# Patient Record
Sex: Female | Born: 1945 | Race: White | Hispanic: No | Smoking: Never smoker
Health system: Southern US, Community
[De-identification: ages and names within clinical notes are randomized; demographics above are authoritative.]

## PROBLEM LIST (undated history)

## (undated) DIAGNOSIS — N393 Stress incontinence (female) (male): Secondary | ICD-10-CM

## (undated) DIAGNOSIS — F32A Depression, unspecified: Secondary | ICD-10-CM

## (undated) DIAGNOSIS — I420 Dilated cardiomyopathy: Principal | ICD-10-CM

## (undated) DIAGNOSIS — M1990 Unspecified osteoarthritis, unspecified site: Secondary | ICD-10-CM

## (undated) DIAGNOSIS — M7121 Synovial cyst of popliteal space [Baker], right knee: Secondary | ICD-10-CM

## (undated) DIAGNOSIS — I2510 Atherosclerotic heart disease of native coronary artery without angina pectoris: Secondary | ICD-10-CM

## (undated) DIAGNOSIS — Z860100 Personal history of colon polyps, unspecified: Secondary | ICD-10-CM

## (undated) DIAGNOSIS — F329 Major depressive disorder, single episode, unspecified: Secondary | ICD-10-CM

## (undated) DIAGNOSIS — R011 Cardiac murmur, unspecified: Secondary | ICD-10-CM

## (undated) DIAGNOSIS — K649 Unspecified hemorrhoids: Secondary | ICD-10-CM

## (undated) DIAGNOSIS — M8580 Other specified disorders of bone density and structure, unspecified site: Secondary | ICD-10-CM

## (undated) DIAGNOSIS — E213 Hyperparathyroidism, unspecified: Secondary | ICD-10-CM

## (undated) DIAGNOSIS — Z9289 Personal history of other medical treatment: Secondary | ICD-10-CM

## (undated) DIAGNOSIS — K219 Gastro-esophageal reflux disease without esophagitis: Secondary | ICD-10-CM

## (undated) DIAGNOSIS — D6851 Activated protein C resistance: Secondary | ICD-10-CM

## (undated) DIAGNOSIS — E669 Obesity, unspecified: Secondary | ICD-10-CM

## (undated) DIAGNOSIS — I82512 Chronic embolism and thrombosis of left femoral vein: Secondary | ICD-10-CM

## (undated) DIAGNOSIS — Z8719 Personal history of other diseases of the digestive system: Secondary | ICD-10-CM

## (undated) DIAGNOSIS — K5790 Diverticulosis of intestine, part unspecified, without perforation or abscess without bleeding: Secondary | ICD-10-CM

## (undated) DIAGNOSIS — E785 Hyperlipidemia, unspecified: Secondary | ICD-10-CM

## (undated) DIAGNOSIS — R609 Edema, unspecified: Secondary | ICD-10-CM

## (undated) DIAGNOSIS — G4730 Sleep apnea, unspecified: Secondary | ICD-10-CM

## (undated) DIAGNOSIS — R002 Palpitations: Secondary | ICD-10-CM

## (undated) HISTORY — PX: HIP ARTHROPLASTY: SHX981

## (undated) HISTORY — PX: COLONOSCOPY: SHX174

## (undated) HISTORY — DX: Atherosclerotic heart disease of native coronary artery without angina pectoris: I25.10

## (undated) HISTORY — DX: Activated protein C resistance: D68.51

## (undated) HISTORY — DX: Chronic embolism and thrombosis of left femoral vein: I82.512

## (undated) HISTORY — DX: Palpitations: R00.2

## (undated) HISTORY — DX: Hyperlipidemia, unspecified: E78.5

## (undated) HISTORY — DX: Hyperparathyroidism, unspecified: E21.3

## (undated) HISTORY — DX: Dilated cardiomyopathy: I42.0

## (undated) HISTORY — DX: Personal history of other medical treatment: Z92.89

## (undated) HISTORY — PX: OTHER SURGICAL HISTORY: SHX169

## (undated) HISTORY — PX: TONSILLECTOMY: SUR1361

---

## 1996-07-15 HISTORY — PX: BLADDER SUSPENSION: SHX72

## 1996-07-15 HISTORY — PX: BILATERAL OOPHORECTOMY: SHX1221

## 1996-07-15 HISTORY — PX: VAGINAL HYSTERECTOMY: SUR661

## 2015-08-18 ENCOUNTER — Encounter (HOSPITAL_COMMUNITY): Payer: Self-pay | Admitting: Family Medicine

## 2015-08-18 ENCOUNTER — Other Ambulatory Visit (HOSPITAL_COMMUNITY): Payer: Self-pay | Admitting: Physician Assistant

## 2015-08-18 ENCOUNTER — Emergency Department (HOSPITAL_COMMUNITY)
Admission: EM | Admit: 2015-08-18 | Discharge: 2015-08-18 | Disposition: A | Discharge status: Home / Self Care | Payer: Medicare Other | Attending: Emergency Medicine | Admitting: Emergency Medicine

## 2015-08-18 ENCOUNTER — Ambulatory Visit (INDEPENDENT_AMBULATORY_CARE_PROVIDER_SITE_OTHER)
Admission: RE | Admit: 2015-08-18 | Discharge: 2015-08-18 | Disposition: A | Discharge status: Home / Self Care | Payer: Medicare Other | Source: Ambulatory Visit | Attending: Vascular Surgery | Admitting: Vascular Surgery

## 2015-08-18 DIAGNOSIS — I82491 Acute embolism and thrombosis of other specified deep vein of right lower extremity: Secondary | ICD-10-CM | POA: Insufficient documentation

## 2015-08-18 DIAGNOSIS — I82431 Acute embolism and thrombosis of right popliteal vein: Secondary | ICD-10-CM | POA: Insufficient documentation

## 2015-08-18 DIAGNOSIS — I82432 Acute embolism and thrombosis of left popliteal vein: Secondary | ICD-10-CM

## 2015-08-18 DIAGNOSIS — I82492 Acute embolism and thrombosis of other specified deep vein of left lower extremity: Secondary | ICD-10-CM | POA: Insufficient documentation

## 2015-08-18 DIAGNOSIS — E669 Obesity, unspecified: Secondary | ICD-10-CM | POA: Diagnosis not present

## 2015-08-18 DIAGNOSIS — Z880 Allergy status to penicillin: Secondary | ICD-10-CM | POA: Insufficient documentation

## 2015-08-18 DIAGNOSIS — I82403 Acute embolism and thrombosis of unspecified deep veins of lower extremity, bilateral: Secondary | ICD-10-CM | POA: Diagnosis not present

## 2015-08-18 DIAGNOSIS — M79605 Pain in left leg: Secondary | ICD-10-CM

## 2015-08-18 DIAGNOSIS — M7989 Other specified soft tissue disorders: Principal | ICD-10-CM

## 2015-08-18 DIAGNOSIS — I82412 Acute embolism and thrombosis of left femoral vein: Secondary | ICD-10-CM

## 2015-08-18 DIAGNOSIS — M79602 Pain in left arm: Secondary | ICD-10-CM | POA: Diagnosis not present

## 2015-08-18 LAB — CBC WITH DIFFERENTIAL/PLATELET
BASOS PCT: 0 %
Basophils Absolute: 0 10*3/uL (ref 0.0–0.1)
EOS ABS: 0.3 10*3/uL (ref 0.0–0.7)
Eosinophils Relative: 5 %
HEMATOCRIT: 38.5 % (ref 36.0–46.0)
HEMOGLOBIN: 12.9 g/dL (ref 12.0–15.0)
LYMPHS ABS: 1.4 10*3/uL (ref 0.7–4.0)
Lymphocytes Relative: 21 %
MCH: 28.7 pg (ref 26.0–34.0)
MCHC: 33.5 g/dL (ref 30.0–36.0)
MCV: 85.7 fL (ref 78.0–100.0)
Monocytes Absolute: 0.3 10*3/uL (ref 0.1–1.0)
Monocytes Relative: 4 %
NEUTROS ABS: 4.7 10*3/uL (ref 1.7–7.7)
NEUTROS PCT: 70 %
Platelets: 196 10*3/uL (ref 150–400)
RBC: 4.49 MIL/uL (ref 3.87–5.11)
RDW: 13.3 % (ref 11.5–15.5)
WBC: 6.7 10*3/uL (ref 4.0–10.5)

## 2015-08-18 LAB — COMPREHENSIVE METABOLIC PANEL
ALBUMIN: 3.9 g/dL (ref 3.5–5.0)
ALK PHOS: 63 U/L (ref 38–126)
ALT: 14 U/L (ref 14–54)
AST: 19 U/L (ref 15–41)
Anion gap: 10 (ref 5–15)
BILIRUBIN TOTAL: 0.7 mg/dL (ref 0.3–1.2)
BUN: 11 mg/dL (ref 6–20)
CALCIUM: 10.9 mg/dL — AB (ref 8.9–10.3)
CO2: 27 mmol/L (ref 22–32)
CREATININE: 0.79 mg/dL (ref 0.44–1.00)
Chloride: 105 mmol/L (ref 101–111)
GFR calc Af Amer: >60 mL/min (ref >60)
GFR calc non Af Amer: >60 mL/min (ref >60)
GLUCOSE: 93 mg/dL (ref 65–99)
Potassium: 4.1 mmol/L (ref 3.5–5.1)
SODIUM: 142 mmol/L (ref 135–145)
Total Protein: 7.6 g/dL (ref 6.5–8.1)

## 2015-08-18 LAB — PROTIME-INR
INR: 1.05 (ref 0.00–1.49)
Prothrombin Time: 13.9 seconds (ref 11.6–15.2)

## 2015-08-18 MED ORDER — XARELTO VTE STARTER PACK 15 & 20 MG PO TBPK: 15.0000 mg | ORAL_TABLET | ORAL | Status: DC

## 2015-08-18 NOTE — Discharge Instructions (Signed)
Follow-up closely with your doctor to help determine why you're getting blood clots. Return the ER if he notice bleeding, head injury or other concerns such as shortness of breath, chest pain or passing out.  If you were given medicines take as directed.  If you are on coumadin or contraceptives realize their levels and effectiveness is altered by many different medicines.  If you have any reaction (rash, tongues swelling, other) to the medicines stop taking and see a physician.    If your blood pressure was elevated in the ER make sure you follow up for management with a primary doctor or return for chest pain, shortness of breath or stroke symptoms.  Please follow up as directed and return to the ER or see a physician for new or worsening symptoms.  Thank you. Filed Vitals:   08/18/15 1502  BP: 142/70  Pulse: 77  Temp: 97.9 F (36.6 C)  TempSrc: Oral  Resp: 16  SpO2: 99%

## 2015-08-18 NOTE — ED Notes (Addendum)
Pt here for positive DVT in BLE. Sent from [facility]

## 2015-08-18 NOTE — ED Provider Notes (Signed)
CSN: [id number]     Arrival date & time 08/18/15  1433 History   First MD Initiated Contact with Patient 08/18/15 1615     Chief Complaint  Patient presents with  . DVT     (Consider location/radiation/quality/duration/timing/severity/associated sxs/prior Treatment) HPI Comments: 70 year old female with history of obesity presents with bilateral leg swelling and bilateral DVT diagnosed [facility]. Patient denies classic risk factors for blood clots. Patient is had worsening swelling in both legs. No shortness of breath or chest pain, no syncope. No recent surgeries. Patient feels well otherwise. Patient has no kidney disease not on blood thinners.  The history is provided by the patient.    History reviewed. No pertinent past medical history. History reviewed. No pertinent past surgical history. History reviewed. No pertinent family history. Social History  Substance Use Topics  . Smoking status: Never Smoker   . Smokeless tobacco: None  . Alcohol Use: None   OB History    No data available     Review of Systems  Constitutional: Negative for fever and chills.  HENT: Negative for congestion.   Eyes: Negative for visual disturbance.  Respiratory: Negative for shortness of breath.   Cardiovascular: Positive for leg swelling. Negative for chest pain.  Gastrointestinal: Negative for vomiting and abdominal pain.  Genitourinary: Negative for dysuria and flank pain.  Musculoskeletal: Negative for back pain, neck pain and neck stiffness.  Skin: Negative for rash.  Neurological: Negative for light-headedness and headaches.      Allergies  Penicillins  Home Medications   Prior to Admission medications   Medication Sig Start Date End Date Taking? Authorizing Provider  XARELTO STARTER PACK 15 & 20 MG TBPK Take 15-20 mg by mouth as directed. Take as directed on package: Start with one 15mg  tablet by mouth twice a day with food. On Day 22, switch  to one 20mg  tablet once a day with food. 08/18/15   Elnora Morrison, MD   BP 142/70 mmHg  Pulse 77  Temp(Src) 97.9 F (36.6 C) (Oral)  Resp 16  SpO2 99% Physical Exam  Constitutional: She is oriented to person, place, and time. She appears well-developed and well-nourished.  HENT:  Head: Normocephalic and atraumatic.  Eyes: Conjunctivae are normal. Right eye exhibits no discharge. Left eye exhibits no discharge.  Neck: Normal range of motion. Neck supple. No tracheal deviation present.  Cardiovascular: Normal rate and regular rhythm.   Pulmonary/Chest: Effort normal and breath sounds normal.  Abdominal: Soft. She exhibits no distension. There is no tenderness. There is no guarding.  Musculoskeletal: She exhibits edema (mild to moderate bilateral mild calf tenderness bilateral).  Neurological: She is alert and oriented to person, place, and time.  Skin: Skin is warm. No rash noted.  Psychiatric: She has a normal mood and affect.  Nursing note and vitals reviewed.   ED Course  Procedures (including critical care time) Labs Review Labs Reviewed  COMPREHENSIVE METABOLIC PANEL - Abnormal; Notable for the following:    Calcium 10.9 (*)    All other components within normal limits  CBC WITH DIFFERENTIAL/PLATELET  PROTIME-INR    Imaging Review No results found. I have personally reviewed and evaluated these images and lab results as part of my medical decision-making.   EKG Interpretation None      MDM   Final diagnoses:  DVT (deep venous thrombosis), bilateral   Patient presents with bilateral leg edema and newly diagnosed blood clots. No reason for the blood clots  at this time. Stressed close follow up with a primary doctor for further workup such as cancer. Plan for Xarelto starter pack, discussed risks of bleeding and reasons to return for signs of PE.  Results and differential diagnosis were discussed with the patient/parent/guardian. Xrays were independently reviewed by  myself.  Close follow up outpatient was discussed, comfortable with the plan.   Medications - No data to display  Filed Vitals:   08/18/15 1502 08/18/15 1645  BP: 142/70 140/72  Pulse: 77 80  Temp: 97.9 F (36.6 C) 98.4 F (36.9 C)  TempSrc: Oral Oral  Resp: 16 16  SpO2: 99% 100%    Final diagnoses:  DVT (deep venous thrombosis), bilateral        Elnora Morrison, MD 08/18/15 1645

## 2015-09-11 ENCOUNTER — Telehealth: Payer: Self-pay | Admitting: Hematology & Oncology

## 2015-09-11 NOTE — Telephone Encounter (Signed)
Patient called and cx 09/28/15 apt and resch for 10/09/15

## 2015-09-28 ENCOUNTER — Ambulatory Visit: Payer: Medicare Other | Admitting: Hematology & Oncology

## 2015-09-28 ENCOUNTER — Other Ambulatory Visit: Payer: Medicare Other

## 2015-09-28 ENCOUNTER — Ambulatory Visit: Payer: Medicare Other

## 2015-10-09 ENCOUNTER — Other Ambulatory Visit: Payer: Self-pay | Admitting: Family

## 2015-10-09 ENCOUNTER — Ambulatory Visit: Payer: Medicare Other

## 2015-10-09 ENCOUNTER — Other Ambulatory Visit (HOSPITAL_BASED_OUTPATIENT_CLINIC_OR_DEPARTMENT_OTHER): Payer: Medicare Other

## 2015-10-09 ENCOUNTER — Ambulatory Visit (HOSPITAL_BASED_OUTPATIENT_CLINIC_OR_DEPARTMENT_OTHER): Payer: Medicare Other | Admitting: Hematology & Oncology

## 2015-10-09 VITALS — BP 126/62 | HR 70 | Temp 98.3°F | Resp 16 | Ht 66.0 in | Wt 193.0 lb

## 2015-10-09 DIAGNOSIS — I82403 Acute embolism and thrombosis of unspecified deep veins of lower extremity, bilateral: Secondary | ICD-10-CM

## 2015-10-09 DIAGNOSIS — I82413 Acute embolism and thrombosis of femoral vein, bilateral: Secondary | ICD-10-CM

## 2015-10-09 DIAGNOSIS — Z7901 Long term (current) use of anticoagulants: Secondary | ICD-10-CM | POA: Diagnosis not present

## 2015-10-09 LAB — CBC WITH DIFFERENTIAL (CANCER CENTER ONLY)
BASO#: 0 10*3/uL (ref 0.0–0.2)
BASO%: 0.5 % (ref 0.0–2.0)
EOS%: 2.1 % (ref 0.0–7.0)
Eosinophils Absolute: 0.1 10*3/uL (ref 0.0–0.5)
HEMATOCRIT: 35.6 % (ref 34.8–46.6)
HGB: 12 g/dL (ref 11.6–15.9)
LYMPH#: 0.9 10*3/uL (ref 0.9–3.3)
LYMPH%: 19.8 % (ref 14.0–48.0)
MCH: 29.3 pg (ref 26.0–34.0)
MCHC: 33.7 g/dL (ref 32.0–36.0)
MCV: 87 fL (ref 81–101)
MONO#: 0.3 10*3/uL (ref 0.1–0.9)
MONO%: 6 % (ref 0.0–13.0)
NEUT#: 3.1 10*3/uL (ref 1.5–6.5)
NEUT%: 71.6 % (ref 39.6–80.0)
Platelets: 149 10*3/uL (ref 145–400)
RBC: 4.1 10*6/uL (ref 3.70–5.32)
RDW: 14.1 % (ref 11.1–15.7)
WBC: 4.3 10*3/uL (ref 3.9–10.0)

## 2015-10-09 LAB — COMPREHENSIVE METABOLIC PANEL
ALT: 12 U/L (ref 0–55)
AST: 17 U/L (ref 5–34)
Albumin: 3.6 g/dL (ref 3.5–5.0)
Alkaline Phosphatase: 52 U/L (ref 40–150)
Anion Gap: 7 mEq/L (ref 3–11)
BUN: 15.7 mg/dL (ref 7.0–26.0)
CALCIUM: 9.8 mg/dL (ref 8.4–10.4)
CHLORIDE: 109 meq/L (ref 98–109)
CO2: 24 mEq/L (ref 22–29)
Creatinine: 0.7 mg/dL (ref 0.6–1.1)
EGFR: 86 mL/min/{1.73_m2} — ABNORMAL LOW (ref >90)
Glucose: 94 mg/dl (ref 70–140)
POTASSIUM: 3.9 meq/L (ref 3.5–5.1)
SODIUM: 140 meq/L (ref 136–145)
Total Bilirubin: 0.76 mg/dL (ref 0.20–1.20)
Total Protein: 7 g/dL (ref 6.4–8.3)

## 2015-10-09 NOTE — Progress Notes (Signed)
Referral MD  Reason for Referral: Bilateral lower extremity thromboembolic disease   Chief Complaint  Patient presents with  . OTHER    New Patient  : I have blood clots in my legs.  HPI: Gloria Lambert is a very nice 70 year old white female. She has been good health. She recently moved down from [place]. She moved out in November area and a son lives down here.  She has a history of hyperparathyroidism. She has a history of hyperlipidemia. If she is having some problems with candidiasis on the skin. I don't know if she might have some diabetes.  She developed some swelling in the left leg back in early February. She went to urgent care. She ultimately had a Doppler done. This showed a thrombus extending from the common femoral vein down to the popliteal vein in the left leg. She also had a thrombus in the right leg. This did not appear to be as extensive.  She was started on Xarelto. She has compression stockings on.  His heart assay if there is any obvious family history of blood clots. I am not sure if her son had any kind of blood clot.  She has had 2 pregnancies. She has had no miscarriages. Both pregnancies were at term.  She has had surgery in the past. She's had no problems with blood clot after surgery.  She has not been on oral contraceptive. She said that she had IUDs back when she was premenopausal.  She's had no bleeding. She's had no cough. She does not smoke. She rarely has alcoholic beverages.  She was kindly referred to the [facility] for an evaluation.  She is in good spirits. She is getting ready to drive up to [place] for Easter. She drives. Her husband does not go because he has COPD and apparently cannot travel much.  Her appetite is good. She is not a vegetarian  Overall, her performance status is ECOG 1.                         No past medical history on file.:  No past surgical history on  file.:   Current outpatient prescriptions:  .  ranitidine (ZANTAC) 300 MG tablet, , Disp: , Rfl:  .  terbinafine (LAMISIL) 250 MG tablet, TK 1 T PO QD, Disp: , Rfl: 0 .  Vitamin D, Ergocalciferol, (DRISDOL) 50000 units CAPS capsule, TK 1 C PO 1 TIME A WK FOR 8 WKS, Disp: , Rfl: 0 .  XARELTO STARTER PACK 15 & 20 MG TBPK, Take 15-20 mg by mouth as directed. Take as directed on package: Start with one 15mg  tablet by mouth twice a day with food. On Day 22, switch to one 20mg  tablet once a day with food., Disp: 51 each, Rfl: 0:  :  Allergies  Allergen Reactions  . Penicillins   :  No family history on file.:  Social History   Social History  . Marital Status: Married    Spouse Name: N/A  . Number of Children: N/A  . Years of Education: N/A   Occupational History  . Not on file.   Social History Main Topics  . Smoking status: Never Smoker   . Smokeless tobacco: Not on file  . Alcohol Use: Not on file  . Drug Use: Not on file  . Sexual Activity: Not on file   Other Topics Concern  . Not on file   Social History  Narrative  . No narrative on file  :  Pertinent items are noted in HPI.  Exam: @IPVITALS @ Well-developed and well-nourished white female in no obvious distress. Vital signs show a temperature of 98.3. Pulse 70. Blood pressure 126/62. Weight is 193 pounds. Head and neck exam shows no ocular or oral lesions. There are no palpable cervical or supraclavicular lymph nodes. Lungs are clear bilaterally. Cardiac exam regular rate and rhythm with no murmurs rubs or bruits. Abdomen is soft. She is mildly obese. She has good bowel sounds. There is no fluid wave. There is no palpable hepatosplenomegaly. Back exam shows no tenderness over the spine, ribs or hips. Extremities she has compression stockings on both legs. After the stockings were removed, she had good pulses in her distal extremities. She had no palpable venous cord in the legs. She may have had some superficial  thrombophlebitis on the dorsum of the left foot. Neurological exam is nonfocal.    Recent Labs  10/09/15 1028  WBC 4.3  HGB 12.0  HCT 35.6  PLT 149   No results for input(s): NA, K, CL, CO2, GLUCOSE, BUN, CREATININE, CALCIUM in the last 72 hours.  Blood smear review:  None  Pathology: None     Assessment and Plan:  Gloria Lambert is a very nice 70 year old white female. She has thromboembolic disease in her legs. It is hard to tell if there is a family history. We will certainly do a hypercoagulable panel on her. I think this is definitely warranted.  She will need at least 6 months of anticoagulation with full dose Xarelto. We then might be able to get her on half dose Xarelto afterwards as maintenance.  I told her that on her trip to [place], that she should take baby aspirin starting the day before her trip, continuing through her trip and then stop the day afterwards. I told her to make sure she takes the baby aspirin with food. I also told her to make sure she stops every hour or so.  We will set her up with [facility] will we see her back in one month. This will give [place] an idea as to how well things are working.  I spent about 45 minutes with her. I answered all of her questions.

## 2015-10-11 LAB — CARDIOLIPIN ANTIBODIES, IGG, IGM, IGA: Anticardiolipin Ab,IgM,Qn: <9 MPL U/mL (ref 0–12)

## 2015-10-11 LAB — LUPUS ANTICOAGULANT PANEL
DRVVT CONFIRM: 1.6 ratio — AB (ref 0.8–1.2)
DRVVT: 115.8 s — AB (ref 0.0–44.0)
HEXAGONAL PHASE PHOSPHOLIPID: 5 s (ref 0–11)
PTT-LA INCUB MIX: 50.5 s — AB (ref 0.0–40.6)
PTT-LA Mix: 40.4 s (ref 0.0–40.6)
PTT-LA: 44.1 s — AB (ref 0.0–43.6)
dRVVT Mix: 81.2 s — ABNORMAL HIGH (ref 0.0–44.0)

## 2015-10-11 LAB — PROTEIN S, TOTAL: Protein S, Total: 105 % (ref 60–150)

## 2015-10-11 LAB — BETA-2-GLYCOPROTEIN I ABS, IGG/M/A
Beta-2 Glyco 1 IgA: <9 GPI IgA units (ref 0–25)
Beta-2 Glycoprotein I Ab, IgG: <9 GPI IgG units (ref 0–20)

## 2015-10-11 LAB — PROTEIN S ACTIVITY: PROTEIN S ACTIVITY: 168 % — AB (ref 63–140)

## 2015-10-11 LAB — PROTEIN C ACTIVITY

## 2015-10-11 LAB — PROTEIN C, TOTAL: Protein C Antigen: 134 % (ref 60–150)

## 2015-10-11 LAB — ANTITHROMBIN III: Antithrombin Activity: 180 % — ABNORMAL HIGH (ref 75–135)

## 2015-10-12 LAB — FACTOR 5 LEIDEN

## 2015-10-13 LAB — PROTHROMBIN GENE MUTATION

## 2015-11-06 ENCOUNTER — Ambulatory Visit (HOSPITAL_BASED_OUTPATIENT_CLINIC_OR_DEPARTMENT_OTHER)
Admission: RE | Admit: 2015-11-06 | Discharge: 2015-11-06 | Disposition: A | Discharge status: Home / Self Care | Payer: Medicare Other | Source: Ambulatory Visit | Attending: Hematology & Oncology | Admitting: Hematology & Oncology

## 2015-11-06 ENCOUNTER — Encounter: Payer: Self-pay | Admitting: Hematology & Oncology

## 2015-11-06 ENCOUNTER — Ambulatory Visit (HOSPITAL_BASED_OUTPATIENT_CLINIC_OR_DEPARTMENT_OTHER): Payer: Medicare Other | Admitting: Hematology & Oncology

## 2015-11-06 ENCOUNTER — Other Ambulatory Visit (HOSPITAL_BASED_OUTPATIENT_CLINIC_OR_DEPARTMENT_OTHER): Payer: Medicare Other

## 2015-11-06 VITALS — BP 114/48 | HR 68 | Temp 98.0°F | Resp 18 | Ht 66.0 in | Wt 192.0 lb

## 2015-11-06 DIAGNOSIS — I82402 Acute embolism and thrombosis of unspecified deep veins of left lower extremity: Secondary | ICD-10-CM | POA: Diagnosis present

## 2015-11-06 DIAGNOSIS — I82403 Acute embolism and thrombosis of unspecified deep veins of lower extremity, bilateral: Secondary | ICD-10-CM | POA: Insufficient documentation

## 2015-11-06 DIAGNOSIS — D6851 Activated protein C resistance: Secondary | ICD-10-CM | POA: Diagnosis not present

## 2015-11-06 DIAGNOSIS — Z7901 Long term (current) use of anticoagulants: Secondary | ICD-10-CM | POA: Diagnosis not present

## 2015-11-06 DIAGNOSIS — M7121 Synovial cyst of popliteal space [Baker], right knee: Secondary | ICD-10-CM | POA: Diagnosis not present

## 2015-11-06 DIAGNOSIS — I82512 Chronic embolism and thrombosis of left femoral vein: Secondary | ICD-10-CM

## 2015-11-06 HISTORY — DX: Activated protein C resistance: D68.51

## 2015-11-06 HISTORY — DX: Chronic embolism and thrombosis of left femoral vein: I82.512

## 2015-11-06 LAB — COMPREHENSIVE METABOLIC PANEL
ALT: 12 U/L (ref 0–55)
ANION GAP: 7 meq/L (ref 3–11)
AST: 17 U/L (ref 5–34)
Albumin: 3.8 g/dL (ref 3.5–5.0)
Alkaline Phosphatase: 56 U/L (ref 40–150)
BILIRUBIN TOTAL: 0.66 mg/dL (ref 0.20–1.20)
BUN: 17.3 mg/dL (ref 7.0–26.0)
CHLORIDE: 111 meq/L — AB (ref 98–109)
CO2: 25 mEq/L (ref 22–29)
CREATININE: 0.8 mg/dL (ref 0.6–1.1)
Calcium: 10.3 mg/dL (ref 8.4–10.4)
EGFR: 82 mL/min/{1.73_m2} — ABNORMAL LOW (ref >90)
Glucose: 86 mg/dl (ref 70–140)
Potassium: 4.2 mEq/L (ref 3.5–5.1)
SODIUM: 143 meq/L (ref 136–145)
TOTAL PROTEIN: 7.3 g/dL (ref 6.4–8.3)

## 2015-11-06 LAB — CBC WITH DIFFERENTIAL (CANCER CENTER ONLY)
BASO#: 0 10*3/uL (ref 0.0–0.2)
BASO%: 0.3 % (ref 0.0–2.0)
EOS%: 3 % (ref 0.0–7.0)
Eosinophils Absolute: 0.2 10*3/uL (ref 0.0–0.5)
HCT: 37.7 % (ref 34.8–46.6)
HGB: 12.9 g/dL (ref 11.6–15.9)
LYMPH#: 1.2 10*3/uL (ref 0.9–3.3)
LYMPH%: 20.9 % (ref 14.0–48.0)
MCH: 29.7 pg (ref 26.0–34.0)
MCHC: 34.2 g/dL (ref 32.0–36.0)
MCV: 87 fL (ref 81–101)
MONO#: 0.3 10*3/uL (ref 0.1–0.9)
MONO%: 4.7 % (ref 0.0–13.0)
NEUT#: 4.1 10*3/uL (ref 1.5–6.5)
NEUT%: 71.1 % (ref 39.6–80.0)
PLATELETS: 152 10*3/uL (ref 145–400)
RBC: 4.34 10*6/uL (ref 3.70–5.32)
RDW: 14.1 % (ref 11.1–15.7)
WBC: 5.7 10*3/uL (ref 3.9–10.0)

## 2015-11-06 NOTE — Progress Notes (Signed)
Hematology and Oncology Follow Up Visit  Gloria Lambert [id number] Jan 22, 1946 70 y.o. 11/06/2015   Principle Diagnosis:   Thrombus of the left femoral vein  Heterozygous for factor V Leiden mutation  Current Therapy:    Xarelto 20 mg by mouth daily-finish 6 months in August 2017     Interim History:  Gloria Lambert is back for follow-up. 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 We found this out when we tested her we saw her a month ago.  She had another Doppler done of her left leg today. This showed partial recanalization. Unfortunately, there is no previous Doppler to look at for the radiologist.  She is doing well. She went to [place] for Easter. She had a good time up there.  She has had no bleeding. Her left leg does not bother her.  She's had no cough. She has had no shortness of breath.  She is worried about her her family as whether or not they need to be tested. I told her that if she has any sisters, they probably need to be tested, particularly if they are on postmenopausal estrogens. As for her sons, they probably need to be checked. Particularly, if they smoke, are diabetic, have sedentary jobs.  Overall, her performance status is ECOG 0.  Medications:  Current outpatient prescriptions:  .  ranitidine (ZANTAC) 300 MG tablet, , Disp: , Rfl:  .  Vitamin D, Ergocalciferol, (DRISDOL) 50000 units CAPS capsule, TK 1 C PO 1 TIME A WK FOR 8 WKS, Disp: , Rfl: 0 .  XARELTO STARTER PACK 15 & 20 MG TBPK, Take 15-20 mg by mouth as directed. Take as directed on package: Start with one 15mg  tablet by mouth twice a day with food. On Day 22, switch to one 20mg  tablet once a day with food., Disp: 51 each, Rfl: 0  Allergies:  Allergies  Allergen Reactions  . Penicillins     Past Medical History, Surgical history, Social history, and Family History were reviewed and updated.  Review of Systems: As above  Physical Exam:  height is 5\' 6"  (1.676 m) and weight is 192 lb  (87.091 kg). Her oral temperature is 98 F (36.7 C). Her blood pressure is 114/48 and her pulse is 68. Her respiration is 18.   Wt Readings from Last 3 Encounters:  11/06/15 192 lb (87.091 kg)  10/09/15 193 lb (87.544 kg)     Head and neck exam shows no ocular or oral lesions. There are no palpable cervical or supraclavicular lymph nodes. Lungs are clear bilaterally. Cardiac exam regular rate and rhythm with no murmurs rubs or bruits. Abdomen is soft. She is mildly obese. She has good bowel sounds. There is no fluid wave. There is no palpable hepatosplenomegaly. Back exam shows no tenderness over the spine, ribs or hips. Extremities she has compression stockings on both legs. After the stockings were removed, she had good pulses in her distal extremities. She had no palpable venous cord in the legs. She may have had some superficial thrombophlebitis on the dorsum of the left foot. Neurological exam is nonfocal.   Lab Results  Component Value Date   WBC 5.7 11/06/2015   HGB 12.9 11/06/2015   HCT 37.7 11/06/2015   MCV 87 11/06/2015   PLT 152 11/06/2015     Chemistry      Component Value Date/Time   NA 140 10/09/2015 1028   NA 142 08/18/2015 1508   K 3.9 10/09/2015 1028   K 4.1  08/18/2015 1508   CL 105 08/18/2015 1508   CO2 24 10/09/2015 1028   CO2 27 08/18/2015 1508   BUN 15.7 10/09/2015 1028   BUN 11 08/18/2015 1508   CREATININE 0.7 10/09/2015 1028   CREATININE 0.79 08/18/2015 1508      Component Value Date/Time   CALCIUM 9.8 10/09/2015 1028   CALCIUM 10.9* 08/18/2015 1508   ALKPHOS 52 10/09/2015 1028   ALKPHOS 63 08/18/2015 1508   AST 17 10/09/2015 1028   AST 19 08/18/2015 1508   ALT 12 10/09/2015 1028   ALT 14 08/18/2015 1508   BILITOT 0.76 10/09/2015 1028   BILITOT 0.7 08/18/2015 1508         Impression and Plan: Gloria Lambert is a 70 year old white female. She is heterozygous for the factor V Leiden. She has a DVT in the left leg. 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 She is wearing her compression stockings. Her leg looks quite good.  I think that given the new information regarding "maintenance" Xarelto, I initially be a very good idea for her after 6 months of full dose Xarelto.  She will complete full dose Xarelto in August. We see her back, I would like to get another Doppler so we can see how her thrombus has improved.  I spent about 35 minutes with her. I went over the factor V Leiden situation.  She's been very proactive with try to minimize her risk of clot propagation.   Volanda Napoleon, MD 4/24/201711:41 AM

## 2015-11-07 LAB — D-DIMER, QUANTITATIVE (NOT AT ARMC): D-DIMER: 0.42 mg{FEU}/L (ref 0.00–0.49)

## 2015-11-09 ENCOUNTER — Ambulatory Visit: Payer: Medicare Other | Admitting: Hematology & Oncology

## 2015-11-09 ENCOUNTER — Other Ambulatory Visit (HOSPITAL_BASED_OUTPATIENT_CLINIC_OR_DEPARTMENT_OTHER): Payer: Medicare Other

## 2015-11-09 ENCOUNTER — Other Ambulatory Visit: Payer: Medicare Other

## 2015-12-19 ENCOUNTER — Encounter: Payer: Self-pay | Admitting: *Deleted

## 2015-12-21 ENCOUNTER — Other Ambulatory Visit: Payer: Self-pay | Admitting: *Deleted

## 2015-12-21 DIAGNOSIS — I82512 Chronic embolism and thrombosis of left femoral vein: Secondary | ICD-10-CM

## 2015-12-21 MED ORDER — RIVAROXABAN 20 MG PO TABS: 20.0000 mg | ORAL_TABLET | Freq: Every day | ORAL | Status: DC

## 2016-03-01 ENCOUNTER — Encounter (HOSPITAL_COMMUNITY): Payer: Self-pay

## 2016-03-01 ENCOUNTER — Ambulatory Visit (HOSPITAL_COMMUNITY)
Admission: RE | Admit: 2016-03-01 | Discharge: 2016-03-01 | Disposition: A | Discharge status: Home / Self Care | Payer: Medicare Other | Source: Ambulatory Visit | Attending: Internal Medicine | Admitting: Internal Medicine

## 2016-03-01 DIAGNOSIS — K219 Gastro-esophageal reflux disease without esophagitis: Secondary | ICD-10-CM | POA: Insufficient documentation

## 2016-03-01 DIAGNOSIS — M810 Age-related osteoporosis without current pathological fracture: Secondary | ICD-10-CM | POA: Diagnosis not present

## 2016-03-01 DIAGNOSIS — Z86718 Personal history of other venous thrombosis and embolism: Secondary | ICD-10-CM | POA: Diagnosis not present

## 2016-03-01 MED ORDER — ZOLEDRONIC ACID 5 MG/100ML IV SOLN
5.0000 mg | Freq: Once | INTRAVENOUS | Status: AC
  Administered 2016-03-01: 5 mg via INTRAVENOUS
  Filled 2016-03-01: qty 100

## 2016-03-01 MED ORDER — SODIUM CHLORIDE 0.9 % IV SOLN
Freq: Once | INTRAVENOUS | Status: AC
  Administered 2016-03-01: 15:00:00 via INTRAVENOUS

## 2016-03-01 NOTE — Progress Notes (Signed)
Uneventful 1st infusion of RECLAST. 15 minute observation post infusion and discharged ambulatory unaccompanied to elevator

## 2016-03-01 NOTE — Discharge Instructions (Signed)
RECLAST °Zoledronic Acid injection (Paget's Disease, Osteoporosis) °What is this medicine? °ZOLEDRONIC ACID (ZOE le dron ik AS id) lowers the amount of calcium loss from bone. It is used to treat Paget's disease and osteoporosis in women. °This medicine may be used for other purposes; ask your health care provider or pharmacist if you have questions. °What should I tell my health care provider before I take this medicine? °They need to know if you have any of these conditions: °-aspirin-sensitive asthma °-cancer, especially if you are receiving medicines used to treat cancer °-dental disease or wear dentures °-infection °-kidney disease °-low levels of calcium in the blood °-past surgery on the parathyroid gland or intestines °-receiving corticosteroids like dexamethasone or prednisone °-an unusual or allergic reaction to zoledronic acid, other medicines, foods, dyes, or preservatives °-pregnant or trying to get pregnant °-breast-feeding °How should I use this medicine? °This medicine is for infusion into a vein. It is given by a health care professional in a hospital or clinic setting. °Talk to your pediatrician regarding the use of this medicine in children. This medicine is not approved for use in children. °Overdosage: If you think you have taken too much of this medicine contact a poison control center or emergency room at once. °NOTE: This medicine is only for you. Do not share this medicine with others. °What if I miss a dose? °It is important not to miss your dose. Call your doctor or health care professional if you are unable to keep an appointment. °What may interact with this medicine? °-certain antibiotics given by injection °-NSAIDs, medicines for pain and inflammation, like ibuprofen or naproxen °-some diuretics like bumetanide, furosemide °-teriparatide °This list may not describe all possible interactions. Give your health care provider a list of all the medicines, herbs, non-prescription drugs, or  dietary supplements you use. Also tell them if you smoke, drink alcohol, or use illegal drugs. Some items may interact with your medicine. °What should I watch for while using this medicine? °Visit your doctor or health care professional for regular checkups. It may be some time before you see the benefit from this medicine. Do not stop taking your medicine unless your doctor tells you to. Your doctor may order blood tests or other tests to see how you are doing. °Women should inform their doctor if they wish to become pregnant or think they might be pregnant. There is a potential for serious side effects to an unborn child. Talk to your health care professional or pharmacist for more information. °You should make sure that you get enough calcium and vitamin D while you are taking this medicine. Discuss the foods you eat and the vitamins you take with your health care professional. °Some people who take this medicine have severe bone, joint, and/or muscle pain. This medicine may also increase your risk for jaw problems or a broken thigh bone. Tell your doctor right away if you have severe pain in your jaw, bones, joints, or muscles. Tell your doctor if you have any pain that does not go away or that gets worse. °Tell your dentist and dental surgeon that you are taking this medicine. You should not have major dental surgery while on this medicine. See your dentist to have a dental exam and fix any dental problems before starting this medicine. Take good care of your teeth while on this medicine. Make sure you see your dentist for regular follow-up appointments. °What side effects may I notice from receiving this medicine? °Side effects that you   should report to your doctor or health care professional as soon as possible: °-allergic reactions like skin rash, itching or hives, swelling of the face, lips, or tongue °-anxiety, confusion, or depression °-breathing problems °-changes in vision °-eye pain °-feeling faint or  lightheaded, falls °-jaw pain, especially after dental work °-mouth sores °-muscle cramps, stiffness, or weakness °-redness, blistering, peeling or loosening of the skin, including inside the mouth °-trouble passing urine or change in the amount of urine °Side effects that usually do not require medical attention (report to your doctor or health care professional if they continue or are bothersome): °-bone, joint, or muscle pain °-constipation °-diarrhea °-fever °-hair loss °-irritation at site where injected °-loss of appetite °-nausea, vomiting °-stomach upset °-trouble sleeping °-trouble swallowing °-weak or tired °This list may not describe all possible side effects. Call your doctor for medical advice about side effects. You may report side effects to FDA at 1-800-FDA-1088. °Where should I keep my medicine? °This drug is given in a hospital or clinic and will not be stored at home. °NOTE: This sheet is a summary. It may not cover all possible information. If you have questions about this medicine, talk to your doctor, pharmacist, or health care provider. °  °© 2016, [employer]vier/[employer]. (2013-11-27 14:19:57) °Osteoporosis °Osteoporosis is the thinning and loss of density in the bones. Osteoporosis makes the bones more brittle, fragile, and likely to break (fracture). Over time, osteoporosis can cause the bones to become so weak that they fracture after a simple fall. The bones most likely to fracture are the bones in the hip, wrist, and spine. °CAUSES  °The exact cause is not known. °RISK FACTORS °Anyone can develop osteoporosis. You may be at greater risk if you have a family history of the condition or have poor nutrition. You may also have a higher risk if you are:  °· Female.   °· 50 years old or older. °· A smoker. °· Not physically active.   °· White or Asian. °· Slender. °SIGNS AND SYMPTOMS  °A fracture might be the first sign of the disease, especially if it results from a fall or injury that would  not usually cause a bone to break. Other signs and symptoms include:  °· Low back and neck pain. °· Stooped posture. °· Height loss. °DIAGNOSIS  °To make a diagnosis, your health care provider may: °· Take a medical history. °· Perform a physical exam. °· Order tests, such as: °¨ A bone mineral density test. °¨ A dual-energy X-ray absorptiometry test. °TREATMENT  °The goal of osteoporosis treatment is to strengthen your bones to reduce your risk of a fracture. Treatment may involve: °· Making lifestyle changes, such as: °¨ Eating a diet rich in calcium. °¨ Doing weight-bearing and muscle-strengthening exercises. °¨ Stopping tobacco use. °¨ Limiting alcohol intake. °· Taking medicine to slow the process of bone loss or to increase bone density. °· Monitoring your levels of calcium and vitamin D. °HOME CARE INSTRUCTIONS °· Include calcium and vitamin D in your diet. Calcium is important for bone health, and vitamin D helps the body absorb calcium. °· Perform weight-bearing and muscle-strengthening exercises as directed by your health care provider. °· Do not use any tobacco products, including cigarettes, chewing tobacco, and electronic cigarettes. If you need help quitting, ask your health care provider. °· Limit your alcohol intake. °· Take medicines only as directed by your health care provider. °· Keep all follow-up visits as directed by your health care provider. This is important. °· Take   precautions at home to lower your risk of falling, such as: °¨ Keeping rooms well lit and clutter free. °¨ Installing safety rails on stairs. °¨ Using rubber mats in the bathroom and other areas that are often wet or slippery. °SEEK IMMEDIATE MEDICAL CARE IF:  °You fall or injure yourself.  °  °This information is not intended to replace advice given to you by your health care provider. Make sure you discuss any questions you have with your health care provider. °  °Document Released: 04/10/2005 Document Revised: 07/22/2014  Document Reviewed: 12/09/2013 °Elsevier Interactive Patient Education ©2016 [employer] ° ° °

## 2016-03-06 ENCOUNTER — Encounter: Payer: Self-pay | Admitting: Hematology & Oncology

## 2016-03-06 ENCOUNTER — Ambulatory Visit (HOSPITAL_BASED_OUTPATIENT_CLINIC_OR_DEPARTMENT_OTHER): Payer: Medicare Other | Admitting: Hematology & Oncology

## 2016-03-06 ENCOUNTER — Other Ambulatory Visit: Payer: Self-pay | Admitting: Nurse Practitioner

## 2016-03-06 ENCOUNTER — Other Ambulatory Visit (HOSPITAL_BASED_OUTPATIENT_CLINIC_OR_DEPARTMENT_OTHER): Payer: Medicare Other

## 2016-03-06 ENCOUNTER — Ambulatory Visit (HOSPITAL_BASED_OUTPATIENT_CLINIC_OR_DEPARTMENT_OTHER)
Admission: RE | Admit: 2016-03-06 | Discharge: 2016-03-06 | Disposition: A | Discharge status: Home / Self Care | Payer: Medicare Other | Source: Ambulatory Visit | Attending: Hematology & Oncology | Admitting: Hematology & Oncology

## 2016-03-06 ENCOUNTER — Encounter (HOSPITAL_BASED_OUTPATIENT_CLINIC_OR_DEPARTMENT_OTHER): Payer: Self-pay

## 2016-03-06 DIAGNOSIS — I82512 Chronic embolism and thrombosis of left femoral vein: Secondary | ICD-10-CM

## 2016-03-06 DIAGNOSIS — M7121 Synovial cyst of popliteal space [Baker], right knee: Secondary | ICD-10-CM | POA: Insufficient documentation

## 2016-03-06 DIAGNOSIS — Z86718 Personal history of other venous thrombosis and embolism: Secondary | ICD-10-CM | POA: Insufficient documentation

## 2016-03-06 DIAGNOSIS — D6851 Activated protein C resistance: Secondary | ICD-10-CM

## 2016-03-06 DIAGNOSIS — I82402 Acute embolism and thrombosis of unspecified deep veins of left lower extremity: Secondary | ICD-10-CM

## 2016-03-06 DIAGNOSIS — Z7901 Long term (current) use of anticoagulants: Secondary | ICD-10-CM

## 2016-03-06 LAB — COMPREHENSIVE METABOLIC PANEL
ALBUMIN: 3.5 g/dL (ref 3.5–5.0)
ALK PHOS: 57 U/L (ref 40–150)
ALT: 14 U/L (ref 0–55)
AST: 18 U/L (ref 5–34)
Anion Gap: 6 mEq/L (ref 3–11)
BUN: 14 mg/dL (ref 7.0–26.0)
CALCIUM: 9.5 mg/dL (ref 8.4–10.4)
CO2: 24 mEq/L (ref 22–29)
CREATININE: 0.6 mg/dL (ref 0.6–1.1)
Chloride: 112 mEq/L — ABNORMAL HIGH (ref 98–109)
EGFR: >90 mL/min/{1.73_m2} (ref >90)
GLUCOSE: 100 mg/dL (ref 70–140)
Potassium: 4.7 mEq/L (ref 3.5–5.1)
SODIUM: 142 meq/L (ref 136–145)
TOTAL PROTEIN: 6.9 g/dL (ref 6.4–8.3)
Total Bilirubin: 0.91 mg/dL (ref 0.20–1.20)

## 2016-03-06 LAB — CBC WITH DIFFERENTIAL (CANCER CENTER ONLY)
BASO#: 0 10*3/uL (ref 0.0–0.2)
BASO%: 0.6 % (ref 0.0–2.0)
EOS%: 2.3 % (ref 0.0–7.0)
Eosinophils Absolute: 0.1 10*3/uL (ref 0.0–0.5)
HEMATOCRIT: 35 % (ref 34.8–46.6)
HEMOGLOBIN: 12.1 g/dL (ref 11.6–15.9)
LYMPH#: 0.8 10*3/uL — AB (ref 0.9–3.3)
LYMPH%: 23.1 % (ref 14.0–48.0)
MCH: 30.6 pg (ref 26.0–34.0)
MCHC: 34.6 g/dL (ref 32.0–36.0)
MCV: 89 fL (ref 81–101)
MONO#: 0.3 10*3/uL (ref 0.1–0.9)
MONO%: 7.1 % (ref 0.0–13.0)
NEUT%: 66.9 % (ref 39.6–80.0)
NEUTROS ABS: 2.3 10*3/uL (ref 1.5–6.5)
Platelets: 146 10*3/uL (ref 145–400)
RBC: 3.95 10*6/uL (ref 3.70–5.32)
RDW: 13.6 % (ref 11.1–15.7)
WBC: 3.5 10*3/uL — ABNORMAL LOW (ref 3.9–10.0)

## 2016-03-06 MED ORDER — RIVAROXABAN 10 MG PO TABS: 10.0000 mg | ORAL_TABLET | Freq: Every day | ORAL | 12 refills | Status: DC

## 2016-03-06 NOTE — Progress Notes (Signed)
Hematology and Oncology Follow Up Visit  Gloria Lambert [id number] 03-28-1946 70 y.o. 03/06/2016   Principle Diagnosis:   Thrombus of the left femoral vein  Heterozygous for factor V Leiden mutation  Current Therapy:    Xarelto 20 mg by mouth daily-finish 6 months in August 2017  Maintenance Xarelto 10 mg by mouth daily-1 year to finish up in September 2018     Interim History:  Gloria Lambert is back for follow-up. 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   We last saw her back in April. Since then, she's been busy. She's been traveling. She has been down  in [place]. She was up in [place].  We did go ahead and do another Doppler of her leg today. This shows the chronic nonobstructing thrombus in the mid left common femoral vein. There is no change from last study.  She does wear compression stockings. She really has not noted much swelling in the left leg.  She's had no problems with fatigue or weakness. She's had no chest wall pain. She's had no bleeding or bruising. She's had no fever.  Overall, her performance status is ECOG 0.  Medications:  Current Outpatient Prescriptions:  .  calcium carbonate (TUMS - DOSED IN MG ELEMENTAL CALCIUM) 500 MG chewable tablet, Chew 1 tablet by mouth 3 times/day as needed-between meals & bedtime for indigestion or heartburn., Disp: , Rfl:  .  cholecalciferol (VITAMIN D) 1000 units tablet, Take 1,000 Units by mouth daily., Disp: , Rfl:  .  Omega-3 Fatty Acids (FISH OIL) 1200 MG CAPS, Take 1 capsule by mouth daily., Disp: , Rfl:  .  Probiotic Product (PROBIOTIC-10 PO), Take 1 capsule by mouth daily., Disp: , Rfl:  .  ranitidine (ZANTAC) 300 MG tablet, Take 300 mg by mouth daily. , Disp: , Rfl:  .  rivaroxaban (XARELTO) 20 MG TABS tablet, Take 1 tablet (20 mg total) by mouth daily with supper., Disp: 30 tablet, Rfl: 3  Allergies:  Allergies  Allergen Reactions  . Penicillins   . Latex Rash    rash  . Penicillin G Rash    Past  Medical History, Surgical history, Social history, and Family History were reviewed and updated.  Review of Systems: As above  Physical Exam:  height is 5\' 6"  (1.676 m) and weight is 185 lb (83.9 kg). Her oral temperature is 98.4 F (36.9 C). Her blood pressure is 122/59 (abnormal) and her pulse is 64. Her respiration is 16.   Wt Readings from Last 3 Encounters:  03/06/16 185 lb (83.9 kg)  03/01/16 187 lb 3.2 oz (84.9 kg)  11/06/15 192 lb (87.1 kg)     Head and neck exam shows no ocular or oral lesions. There are no palpable cervical or supraclavicular lymph nodes. Lungs are clear bilaterally. Cardiac exam regular rate and rhythm with no murmurs rubs or bruits. Abdomen is soft. She is mildly obese. She has good bowel sounds. There is no fluid wave. There is no palpable hepatosplenomegaly. Back exam shows no tenderness over the spine, ribs or hips. Extremities she has compression stockings on both legs. After the stockings were removed, she had good pulses in her distal extremities. She had no palpable venous cord in the legs. She may have had some superficial thrombophlebitis on the dorsum of the left foot. Neurological exam is nonfocal.   Lab Results  Component Value Date   WBC 3.5 (L) 03/06/2016   HGB 12.1 03/06/2016   HCT 35.0 03/06/2016   MCV  89 03/06/2016   PLT 146 03/06/2016     Chemistry      Component Value Date/Time   NA 142 03/06/2016 1030   K 4.7 03/06/2016 1030   CL 105 08/18/2015 1508   CO2 24 03/06/2016 1030   BUN 14.0 03/06/2016 1030   CREATININE 0.6 03/06/2016 1030      Component Value Date/Time   CALCIUM 9.5 03/06/2016 1030   ALKPHOS 57 03/06/2016 1030   AST 18 03/06/2016 1030   ALT 14 03/06/2016 1030   BILITOT 0.91 03/06/2016 1030         Impression and Plan: Gloria Lambert is a 70 year old white female. She is heterozygous for the factor V Leiden. She has a DVT in the left leg. I'm sure she will have a chronic thrombus. She is wearing her compression  stockings. Her leg looks quite good.  I think that it would be a good idea to have her on maintenance Xarelto. I think this would be a very good way of time to minimize her risk of clot formation. She does travel quite a bit. As such, I think that having her on blood thinner would not be about idea for another year.   I talked to her about this. She is agreeable.  I would like to see her back in another 4 months. I don't think we need another Doppler. She has a chronic thrombus which I'm sure she will always have.   Volanda Napoleon, MD 8/23/20175:50 PM

## 2016-03-07 ENCOUNTER — Other Ambulatory Visit (HOSPITAL_BASED_OUTPATIENT_CLINIC_OR_DEPARTMENT_OTHER): Payer: Medicare Other

## 2016-03-07 ENCOUNTER — Ambulatory Visit: Payer: Medicare Other | Admitting: Hematology & Oncology

## 2016-03-07 ENCOUNTER — Other Ambulatory Visit: Payer: Medicare Other

## 2016-03-07 LAB — D-DIMER, QUANTITATIVE (NOT AT ARMC): D-DIMER: 0.4 mg{FEU}/L (ref 0.00–0.49)

## 2016-04-19 ENCOUNTER — Ambulatory Visit: Payer: Medicare Other | Attending: Internal Medicine | Admitting: Physical Therapy

## 2016-04-19 DIAGNOSIS — M6281 Muscle weakness (generalized): Secondary | ICD-10-CM | POA: Insufficient documentation

## 2016-04-19 DIAGNOSIS — M25511 Pain in right shoulder: Secondary | ICD-10-CM | POA: Insufficient documentation

## 2016-04-19 DIAGNOSIS — R293 Abnormal posture: Secondary | ICD-10-CM | POA: Insufficient documentation

## 2016-04-19 NOTE — Therapy (Signed)
[facility] [address]  [place], [place], [address] Phone: [phone]   Fax:  [phone]  Physical Therapy Evaluation  Patient Details  Name: Gloria Lambert MRN: [id number] Date of Birth: 25-Nov-1945 Referring Provider: Dr. Delrae Rend  Encounter Date: 04/19/2016      PT End of Session - 04/19/16 1138    Visit Number 1   Number of Visits 12   Date for PT Re-Evaluation 05/31/16   PT Start Time 1055   PT Stop Time 1135   PT Time Calculation (min) 40 min   Activity Tolerance Patient tolerated treatment well   Behavior During Therapy [facility] for tasks assessed/performed      Past Medical History:  Diagnosis Date  . 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 Heterozygous factor V Leiden mutation ([place]) 11/06/2015  . Parathyroid abnormality ([place])    "have 2 nodules on them and they are watching these. Makes my calcium level elevated"    Past Surgical History:  Procedure Laterality Date  . arthroscopic knee surgery Right   . BILATERAL OOPHORECTOMY  1998  . BLADDER SUSPENSION  1998  . HIP ARTHROPLASTY Right    total hip replacement  . VAGINAL HYSTERECTOMY  1998    There were no vitals filed for this visit.       Subjective Assessment - 04/19/16 1058    Subjective Pt is a 70 y/o female who presents to OPPT for sudden onset of R shoulder pain ~ 5 weeks ago.  Pt reports she had reclast infusion in Aug 2017 and concerned about side effects (but MD ruled this out).     Pertinent History Chronic DVT LLE   Limitations House hold activities;Lifting   Diagnostic tests none   Patient Stated Goals improve pain   Currently in Pain? Yes   Pain Score 0-No pain  up to 5/10   Pain Location Shoulder   Pain Orientation Right   Pain Descriptors / Indicators Sore   Pain Type --  subacute   Pain Onset More than a month ago   Pain Frequency Intermittent   Aggravating Factors  lying on  back, vacuuming, overhead reaching and abduction   Pain Relieving Factors keeping shoulder still   Effect of Pain on Daily Activities dressing, sleeping            [facility] PT Assessment - 04/19/16 1103      Assessment   Medical Diagnosis R shoulder pain; thoracic kyphosis   Referring Provider Dr. Delrae Rend   Onset Date/Surgical Date 03/15/16   Hand Dominance Right   Next MD Visit unknown   Prior Therapy following [facility] - no other PT     Precautions   Precautions None     Restrictions   Weight Bearing Restrictions No     Balance Screen   Has the patient fallen in the past 6 months No   Has the patient had a decrease in activity level because of a fear of falling?  No   Is the patient reluctant to leave their home because of a fear of falling?  No     Home Social worker Private residence   Living Arrangements Spouse/significant other   Additional Comments independent with ADLs, some pain with dressing/bathing     Prior Function   Level of Independence Independent   Vocation Retired   [employer] retired Haematologist, scrapbooking  Cognition   Overall Cognitive Status Within Functional Limits for tasks assessed     Observation/Other Assessments   Focus on Therapeutic Outcomes (FOTO)  56 (44% limited; predicted 31% limited)     Posture/Postural Control   Posture/Postural Control Postural limitations   Postural Limitations Rounded Shoulders;Forward head;Increased thoracic kyphosis     AROM   AROM Assessment Site Shoulder   Right/Left Shoulder Right;Left   Right Shoulder Extension --   Right Shoulder Flexion 154 Degrees   Right Shoulder ABduction 115 Degrees   Right Shoulder Internal Rotation 90 Degrees   Right Shoulder External Rotation 85 Degrees   Left Shoulder Flexion 160 Degrees   Left Shoulder ABduction 160 Degrees   Left Shoulder Internal Rotation 90 Degrees   Left Shoulder External Rotation 90 Degrees      PROM   PROM Assessment Site Shoulder   Right/Left Shoulder Right   Right Shoulder ABduction 132 Degrees     Strength   Strength Assessment Site Shoulder   Right/Left Shoulder Right;Left   Right Shoulder Flexion 3/5   Right Shoulder ABduction 3/5   Right Shoulder Internal Rotation 5/5   Right Shoulder External Rotation 3/5   Left Shoulder Flexion 4/5   Left Shoulder ABduction 4/5   Left Shoulder Internal Rotation 5/5   Left Shoulder External Rotation 4/5   Right/Left Elbow --     Palpation   Palpation comment no tenderness noted today with palpation     Special Tests    Special Tests Rotator Cuff Impingement   Rotator Cuff Impingment tests Michel Bickers test     Hawkins-Kennedy test   Findings Positive   Side Right                   [facility] Adult PT Treatment/Exercise - 04/19/16 1103      Exercises   Exercises Shoulder     Shoulder Exercises: Standing   Row Both;10 reps;Theraband   Theraband Level (Shoulder Row) Level 3 (Green)   Row Limitations for HEP instruction; cues for scap retraction     Shoulder Exercises: IT sales professional 1 rep;20 seconds   Corner Stretch Limitations for HEP instruction; mid level arm height                PT Education - 04/19/16 1138    Education provided Yes   Education Details clinical findings, POC, HEP   Person(s) Educated Patient   Methods Explanation;Demonstration;Handout   Comprehension Verbalized understanding;Returned demonstration;Need further instruction             PT Long Term Goals - 04/19/16 1141      PT LONG TERM GOAL #1   Title independent with HEP (05/31/16)   Time 6   Period Weeks   Status New     PT LONG TERM GOAL #2   Title improve R shoulder abduction AROM to 150 degrees for improved function and motion (05/31/16)   Time 6   Period Weeks   Status New     PT LONG TERM GOAL #3   Title demonstrate at least 4/5 RUE shoulder strength for improved function (05/31/16)    Time 6   Period Weeks   Status New     PT LONG TERM GOAL #4   Title report ability to perform ADLs without increase in pain for improved function with ADLs (05/31/16)   Time 6   Period Weeks   Status New  Plan - 04/19/16 1138    Clinical Impression Statement Pt is a 70 y/o female who presents to OPPT for moderate complexity eval of R shoulder pain consistent with rotator cuff impingement.  Pt demonstrates R shoulder pain, weakness, postural abnormalities and decreased ROM affecting ADLs and function.  Will benefit from PT to maximize function and address deficits.  Pt with chronic DVT currently asymptomatic but will monitor with exercise.   Rehab Potential Good   PT Frequency 2x / week   PT Duration 6 weeks   PT Treatment/Interventions ADLs/Self Care Home Management;Cryotherapy;Electrical Stimulation;Iontophoresis 4mg /ml Dexamethasone;Moist Heat;Ultrasound;Neuromuscular re-education;Therapeutic exercise;Therapeutic activities;Functional mobility training;Patient/family education;Manual techniques;Passive range of motion;Vasopneumatic Device;Dry needling;Taping   PT Next Visit Plan review HEP, progress RTC/flexion strengthening, abduction ROM   Consulted and Agree with Plan of Care Patient      Patient will benefit from skilled therapeutic intervention in order to improve the following deficits and impairments:  Pain, Impaired UE functional use, Postural dysfunction, Decreased range of motion, Decreased strength  Visit Diagnosis: Acute pain of right shoulder - Plan: PT plan of care cert/re-cert  Abnormal posture - Plan: PT plan of care cert/re-cert  Muscle weakness (generalized) - Plan: PT plan of care cert/re-cert      G-Codes - [id number] 1143    Functional Assessment Tool Used FOTO 44% limited   Functional Limitation Carrying, moving and handling objects   Carrying, Moving and Handling Objects Current Status [id number]) At least 40 percent but less than 60 percent  impaired, limited or restricted   Carrying, Moving and Handling Objects Goal Status [id number]) At least 20 percent but less than 40 percent impaired, limited or restricted       Problem List Patient Active Problem List   Diagnosis Date Noted  . 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 Heterozygous factor V Leiden mutation ([place]) 11/06/2015       Laureen Abrahams, PT, DPT 04/19/16 11:45 AM    [facility] [address]  [place], [place], [address] Phone: [phone]   Fax:  [phone]  Name: Gloria Lambert MRN: [id number] Date of Birth: July 31, 1945

## 2016-04-19 NOTE — Patient Instructions (Signed)
CHEST: Doorway, Bilateral - Standing    Standing in doorway, place hands on wall with elbows bent at shoulder height. Lean forward. Hold _20-30__ seconds. _2-3__ reps per set, __1-2_ sets per day, _7__ days per week  Copyright  VHI. All rights reserved.    Scapular Retraction: Rowing (Eccentric) - Arms - Side (Resistance Band)    Hold end of band in each hand. Pull back until elbows are even with trunk. Keep elbows by sides, thumbs up. Slowly release for 3-5 seconds. Use __green___ resistance band. _15__ reps per set, __1-2_ sets per day, _7__ days per week.   http://ecce.exer.us/227   Copyright  VHI. All rights reserved.

## 2016-04-23 ENCOUNTER — Ambulatory Visit: Payer: Medicare Other

## 2016-04-23 DIAGNOSIS — M25511 Pain in right shoulder: Secondary | ICD-10-CM | POA: Diagnosis not present

## 2016-04-23 DIAGNOSIS — M6281 Muscle weakness (generalized): Secondary | ICD-10-CM

## 2016-04-23 DIAGNOSIS — R293 Abnormal posture: Secondary | ICD-10-CM

## 2016-04-23 NOTE — Therapy (Signed)
[facility] [address]  [place], [place], [address] Phone: [phone]   Fax:  [phone]  Physical Therapy Treatment  Patient Details  Name: Gloria Lambert MRN: [id number] Date of Birth: 09-Jul-1946 Referring Provider: Dr. Delrae Rend  Encounter Date: 04/23/2016      PT End of Session - 04/23/16 1454    Visit Number 2   Number of Visits 12   Date for PT Re-Evaluation 05/31/16   PT Start Time 1402   PT Stop Time 1445   PT Time Calculation (min) 43 min   Activity Tolerance Patient tolerated treatment well   Behavior During Therapy [facility] for tasks assessed/performed      Past Medical History:  Diagnosis Date  . 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 Heterozygous factor V Leiden mutation ([place]) 11/06/2015  . Parathyroid abnormality ([place])    "have 2 nodules on them and they are watching these. Makes my calcium level elevated"    Past Surgical History:  Procedure Laterality Date  . arthroscopic knee surgery Right   . BILATERAL OOPHORECTOMY  1998  . BLADDER SUSPENSION  1998  . HIP ARTHROPLASTY Right    total hip replacement  . VAGINAL HYSTERECTOMY  1998    There were no vitals filed for this visit.      Subjective Assessment - 04/23/16 1407    Subjective Pt. reporting she still can't sleep on R side due to shoulder pain.     Patient Stated Goals improve pain   Currently in Pain? No/denies   Pain Score 0-No pain   Multiple Pain Sites No        Today's treatment:   Therex: UBE: lvl 1.5, 3 min each way forward/backward  HEP review:  Scapular retraction with green TB x 10 reps; pt. With limited retraction movement Doorway stretch (mid/low) x 30 sec each; pt. with mild pain on mid thus instructed to only perform low   Therex: Leaning next to door frame:        Scapular squeeze 5" x 10 reps Standing R shoulder IR, ER with red TB x 10 reps  Standing B shoulder  ER with foam bolster squeeze on doorframe x 10 reps  Pulleys R shoulder abduction x 2 min  R shoulder abduction stretch on peg board 10" x 10 reps BATCA low row 20# x 15 reps   * Supine on 1/2 foam bolster attemped however not tolerated well           PT Long Term Goals - 04/23/16 1454      PT LONG TERM GOAL #1   Title independent with HEP (05/31/16)   Time 6   Period Weeks   Status On-going     PT LONG TERM GOAL #2   Title improve R shoulder abduction AROM to 150 degrees for improved function and motion (05/31/16)   Time 6   Period Weeks   Status On-going     PT LONG TERM GOAL #3   Title demonstrate at least 4/5 RUE shoulder strength for improved function (05/31/16)   Time 6   Period Weeks   Status On-going     PT LONG TERM GOAL #4   Title report ability to perform ADLs without increase in pain for improved function with ADLs (05/31/16)   Time 6   Period Weeks   Status On-going  Plan - 04/23/16 1454    Clinical Impression Statement Pt. pain free today and with good demonstration of HEP activities.  Mid doorway stretch causing mild pain at home thus pt. instructed to only perform low doorway stretch.  Scapular and RTC strengthening progressed today with good response.  Will plan to continue progression as tolerated.   PT Treatment/Interventions ADLs/Self Care Home Management;Cryotherapy;Electrical Stimulation;Iontophoresis 4mg /ml Dexamethasone;Moist Heat;Ultrasound;Neuromuscular re-education;Therapeutic exercise;Therapeutic activities;Functional mobility training;Patient/family education;Manual techniques;Passive range of motion;Vasopneumatic Device;Dry needling;Taping   PT Next Visit Plan Progress RTC/flexion strengthening, abduction ROM      Patient will benefit from skilled therapeutic intervention in order to improve the following deficits and impairments:  Pain, Impaired UE functional use, Postural dysfunction, Decreased range of motion,  Decreased strength  Visit Diagnosis: Acute pain of right shoulder  Abnormal posture  Muscle weakness (generalized)     Problem List Patient Active Problem List   Diagnosis Date Noted  . 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 Heterozygous factor V Leiden mutation [facility]) 11/06/2015    Bess Harvest, PTA 04/23/2016, 6:26 PM  [facility] [address]  [place], [place], [address] Phone: [phone]   Fax:  [phone]  Name: Gloria Lambert MRN: [id number] Date of Birth: [birth year]

## 2016-04-25 ENCOUNTER — Ambulatory Visit: Payer: Medicare Other | Admitting: Physical Therapy

## 2016-04-25 DIAGNOSIS — R293 Abnormal posture: Secondary | ICD-10-CM

## 2016-04-25 DIAGNOSIS — M6281 Muscle weakness (generalized): Secondary | ICD-10-CM

## 2016-04-25 DIAGNOSIS — M25511 Pain in right shoulder: Secondary | ICD-10-CM

## 2016-04-25 NOTE — Therapy (Signed)
[facility] [address]  [place], [place], [address] Phone: [phone]   Fax:  [phone]  Physical Therapy Treatment  Patient Details  Name: Gloria Lambert MRN: [id number] Date of Birth: 1946/03/26 Referring Provider: Dr. Delrae Rend  Encounter Date: 04/25/2016      PT End of Session - 04/25/16 1439    Visit Number 3   Number of Visits 12   Date for PT Re-Evaluation 05/31/16   PT Start Time 1400   PT Stop Time 1442   PT Time Calculation (min) 42 min   Activity Tolerance Patient tolerated treatment well   Behavior During Therapy [facility] for tasks assessed/performed      Past Medical History:  Diagnosis Date  . 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 Heterozygous factor V Leiden mutation ([place]) 11/06/2015  . Parathyroid abnormality ([place])    "have 2 nodules on them and they are watching these. Makes my calcium level elevated"    Past Surgical History:  Procedure Laterality Date  . arthroscopic knee surgery Right   . BILATERAL OOPHORECTOMY  1998  . BLADDER SUSPENSION  1998  . HIP ARTHROPLASTY Right    total hip replacement  . VAGINAL HYSTERECTOMY  1998    There were no vitals filed for this visit.      Subjective Assessment - 04/25/16 1403    Subjective R shoulder is "about the same."   Pertinent History Chronic DVT LLE   Limitations House hold activities;Lifting   Diagnostic tests none   Patient Stated Goals improve pain   Currently in Pain? No/denies                         [facility] Adult PT Treatment/Exercise - 04/25/16 1404      Shoulder Exercises: Seated   Abduction Right;20 reps;Weights   ABduction Weight (lbs) 1   ABduction Limitations eccentric 5 sec holds with 1# 2x10     Shoulder Exercises: Standing   Row Both;15 reps;Theraband   Theraband Level (Shoulder Row) Level 3 (Green)   Row Limitations 2 sets   Other Standing Exercises  scaption wall ladder with 1# cuff weight x 10     Shoulder Exercises: Pulleys   ABduction 3 minutes   ABduction Limitations scaption     Shoulder Exercises: ROM/Strengthening   UBE (Upper Arm Bike) L3.0 x 6 min (3' fwd/3' bwd)   Cybex Row Limitations 20# 2x15 narrow grip     Manual Therapy   Manual Therapy Passive ROM   Passive ROM abduction/scaption in sitting to tolerance                     PT Long Term Goals - 04/23/16 1454      PT LONG TERM GOAL #1   Title independent with HEP (05/31/16)   Time 6   Period Weeks   Status On-going     PT LONG TERM GOAL #2   Title improve R shoulder abduction AROM to 150 degrees for improved function and motion (05/31/16)   Time 6   Period Weeks   Status On-going     PT LONG TERM GOAL #3   Title demonstrate at least 4/5 RUE shoulder strength for improved function (05/31/16)   Time 6   Period Weeks   Status On-going     PT LONG TERM GOAL #4   Title report ability to perform ADLs without  increase in pain for improved function with ADLs (05/31/16)   Time 6   Period Weeks   Status On-going               Plan - 04/25/16 1439    Clinical Impression Statement Pt tolerated strengthening exercises well with some discomfort with eccentric abduction.  Treatment interventions may be limited due to pt's ability to lie supine, prone or on side.  Will continue to benefit from PT to maximize function.   PT Treatment/Interventions ADLs/Self Care Home Management;Cryotherapy;Electrical Stimulation;Iontophoresis 4mg /ml Dexamethasone;Moist Heat;Ultrasound;Neuromuscular re-education;Therapeutic exercise;Therapeutic activities;Functional mobility training;Patient/family education;Manual techniques;Passive range of motion;Vasopneumatic Device;Dry needling;Taping   PT Next Visit Plan Progress RTC/flexion strengthening, abduction ROM   Consulted and Agree with Plan of Care Patient      Patient will benefit from skilled therapeutic  intervention in order to improve the following deficits and impairments:  Pain, Impaired UE functional use, Postural dysfunction, Decreased range of motion, Decreased strength  Visit Diagnosis: Acute pain of right shoulder  Abnormal posture  Muscle weakness (generalized)     Problem List Patient Active Problem List   Diagnosis Date Noted  . 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 Heterozygous factor V Leiden mutation ([place]) 11/06/2015       Laureen Abrahams, PT, DPT 04/25/16 2:42 PM   [facility] [address]  [place], [place], [address] Phone: [phone]   Fax:  [phone]  Name: Almee Locklin MRN: [id number] Date of Birth: 1945/08/01

## 2016-04-30 ENCOUNTER — Ambulatory Visit: Payer: Medicare Other

## 2016-04-30 DIAGNOSIS — M6281 Muscle weakness (generalized): Secondary | ICD-10-CM

## 2016-04-30 DIAGNOSIS — M25511 Pain in right shoulder: Secondary | ICD-10-CM

## 2016-04-30 DIAGNOSIS — R293 Abnormal posture: Secondary | ICD-10-CM

## 2016-04-30 NOTE — Therapy (Signed)
[facility] [address]  [place], [place], [address] Phone: [phone]   Fax:  [phone]  Physical Therapy Treatment  Patient Details  Name: Gloria Lambert MRN: [id number] Date of Birth: 09-Oct-1945 Referring Provider: Dr. Delrae Rend   Encounter Date: 04/30/2016      PT End of Session - 04/30/16 1410    Visit Number 4   Number of Visits 12   Date for PT Re-Evaluation 05/31/16   PT Start Time [id number]   PT Stop Time 1443   PT Time Calculation (min) 38 min   Activity Tolerance Patient tolerated treatment well   Behavior During Therapy [facility] for tasks assessed/performed      Past Medical History:  Diagnosis Date  . 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 Heterozygous factor V Leiden mutation ([place]) 11/06/2015  . Parathyroid abnormality ([place])    "have 2 nodules on them and they are watching these. Makes my calcium level elevated"    Past Surgical History:  Procedure Laterality Date  . arthroscopic knee surgery Right   . BILATERAL OOPHORECTOMY  1998  . BLADDER SUSPENSION  1998  . HIP ARTHROPLASTY Right    total hip replacement  . VAGINAL HYSTERECTOMY  1998    There were no vitals filed for this visit.      Subjective Assessment - 04/30/16 1408    Subjective Pt. reporting she won't be at therapy 1st week in November and will have to reschedule november 2nd appointment.  Saturday night in the recliner pt. reports she had increased R shoulder pain after sleeping and this pain has not resolved since this point.     Patient Stated Goals improve pain   Currently in Pain? No/denies   Pain Score 0-No pain   Multiple Pain Sites No            [facility] PT Assessment - 04/30/16 1415      Assessment   Medical Diagnosis R shoulder pain; thoracic kyphosis   Referring Provider Dr. Delrae Rend    Next MD Visit 05/08/16  pt. intends to reschedule to later date             [facility] Adult PT Treatment/Exercise - 04/30/16 1418      Shoulder Exercises: Standing   Other Standing Exercises scaption, abduction (x10) wall ladder with 1# cuff weight x 15     Shoulder Exercises: Pulleys   ABduction 3 minutes   ABduction Limitations scaption     Shoulder Exercises: ROM/Strengthening   UBE (Upper Arm Bike) L3.0 x 6 min (3' fwd/3' bwd)   Cybex Row Limitations 25# 2 x15 35# on 2nd set; narrow grip    "W" Arms x 15 reps with red TB on door seal with scapular squeeze   Caudal Glide caudal glides x 2 min with PROM abduction in sitting     Manual Therapy   Manual Therapy Passive ROM   Passive ROM abduction/scaption in sitting to tolerance                     PT Long Term Goals - 04/23/16 1454      PT LONG TERM GOAL #1   Title independent with HEP (05/31/16)   Time 6   Period Weeks   Status On-going     PT LONG TERM GOAL #2   Title improve R shoulder abduction AROM to 150 degrees for improved function and motion (05/31/16)  Time 6   Period Weeks   Status On-going     PT LONG TERM GOAL #3   Title demonstrate at least 4/5 RUE shoulder strength for improved function (05/31/16)   Time 6   Period Weeks   Status On-going     PT LONG TERM GOAL #4   Title report ability to perform ADLs without increase in pain for improved function with ADLs (05/31/16)   Time 6   Period Weeks   Status On-going               Plan - 04/30/16 1411    Clinical Impression Statement  Pt. tolerating progression in scapular and RTC strengthening activity without pain today.  Significant time spent on gentle R shoulder abduction AAROM and stretching.  Pt. abduction motion seems to be improving however not measured today.  Pt. progressing well at this point.   PT Treatment/Interventions ADLs/Self Care Home Management;Cryotherapy;Electrical Stimulation;Iontophoresis 4mg /ml Dexamethasone;Moist Heat;Ultrasound;Neuromuscular re-education;Therapeutic exercise;Therapeutic  activities;Functional mobility training;Patient/family education;Manual techniques;Passive range of motion;Vasopneumatic Device;Dry needling;Taping   PT Next Visit Plan Progress RTC/flexion strengthening, abduction ROM      Patient will benefit from skilled therapeutic intervention in order to improve the following deficits and impairments:  Pain, Impaired UE functional use, Postural dysfunction, Decreased range of motion, Decreased strength  Visit Diagnosis: Acute pain of right shoulder  Abnormal posture  Muscle weakness (generalized)     Problem List Patient Active Problem List   Diagnosis Date Noted  . 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 Heterozygous factor V Leiden mutation ([place]) 11/06/2015    Bess Harvest, PTA 04/30/16 6:19 PM   [facility] [address]  [place], [place], [address] Phone: [phone]   Fax:  [phone]  Name: Gloria Lambert MRN: [id number] Date of Birth: 1945/08/25

## 2016-05-02 ENCOUNTER — Ambulatory Visit: Payer: Medicare Other | Admitting: Physical Therapy

## 2016-05-02 DIAGNOSIS — M6281 Muscle weakness (generalized): Secondary | ICD-10-CM

## 2016-05-02 DIAGNOSIS — R293 Abnormal posture: Secondary | ICD-10-CM

## 2016-05-02 DIAGNOSIS — M25511 Pain in right shoulder: Secondary | ICD-10-CM

## 2016-05-02 NOTE — Therapy (Signed)
[facility] [address]  [place], [place], [address] Phone: [phone]   Fax:  [phone]  Physical Therapy Treatment  Patient Details  Name: Gloria Lambert MRN: [id number] Date of Birth: 12/14/1945 Referring Provider: Dr. Delrae Rend   Encounter Date: 05/02/2016      PT End of Session - 05/02/16 1442    Visit Number 5   Number of Visits 12   Date for PT Re-Evaluation 05/31/16   PT Start Time 1400   PT Stop Time 1441   PT Time Calculation (min) 41 min   Activity Tolerance Patient tolerated treatment well   Behavior During Therapy [facility] for tasks assessed/performed      Past Medical History:  Diagnosis Date  . Chronic deep vein thrombosis (DVT) of left femoral vein ([place]) 11/06/2015  . Heterozygous factor V Leiden mutation ([place]) 11/06/2015  . Parathyroid abnormality ([place])    "have 2 nodules on them and they are watching these. Makes my calcium level elevated"    Past Surgical History:  Procedure Laterality Date  . arthroscopic knee surgery Right   . BILATERAL OOPHORECTOMY  1998  . BLADDER SUSPENSION  1998  . HIP ARTHROPLASTY Right    total hip replacement  . VAGINAL HYSTERECTOMY  1998    There were no vitals filed for this visit.      Subjective Assessment - 05/02/16 1359    Subjective Pt. reporting she continuous to have pain with turning off alarm as well as pain with seatbelt. Reports np significant change in symptoms.    Pertinent History Chronic DVT LLE   Limitations House hold activities;Lifting   Patient Stated Goals improve pain   Currently in Pain? Yes   Pain Score 6    Pain Location Shoulder   Pain Orientation Right   Pain Descriptors / Indicators Sharp   Pain Type --  subacute   Pain Onset More than a month ago   Pain Frequency Intermittent   Aggravating Factors  external rotation and reaching back in car most aggravating    Pain Relieving Factors remaining still   Multiple Pain  Sites No            [facility] PT Assessment - 05/02/16 1440      AROM   Right Shoulder ABduction 131 Degrees                     [facility] Adult PT Treatment/Exercise - 05/02/16 1406      Shoulder Exercises: Standing   External Rotation Both;15 reps   Theraband Level (Shoulder External Rotation) Level 2 (Red)   External Rotation Limitations in door frame with added scapular retraction   Retraction Both;15 reps   Retraction Limitations in door frame; 5" hold   Other Standing Exercises scaption wall ladder with 1# cuff weight x 15     Shoulder Exercises: Pulleys   ABduction 3 minutes   ABduction Limitations scaption     Shoulder Exercises: ROM/Strengthening   UBE (Upper Arm Bike) L3.5 x 6 min (3' fwd/3' bwd)   Cybex Row Limitations 35# 2 x15; narrow grip     Shoulder Exercises: Stretch   Other Shoulder Stretches 3-way door stretch 3x30" each position; only able to complete low and mid level with highest level limited due to pain     Manual Therapy   Manual Therapy Passive ROM   Passive ROM abduction/scaption in sitting to tolerance  PT Long Term Goals - 04/23/16 1454      PT LONG TERM GOAL #1   Title independent with HEP (05/31/16)   Time 6   Period Weeks   Status On-going     PT LONG TERM GOAL #2   Title improve R shoulder abduction AROM to 150 degrees for improved function and motion (05/31/16)   Time 6   Period Weeks   Status On-going     PT LONG TERM GOAL #3   Title demonstrate at least 4/5 RUE shoulder strength for improved function (05/31/16)   Time 6   Period Weeks   Status On-going     PT LONG TERM GOAL #4   Title report ability to perform ADLs without increase in pain for improved function with ADLs (05/31/16)   Time 6   Period Weeks   Status On-going               Plan - 05/02/16 1443    Clinical Impression Statement Pt. demonstrating an improvement in active abduction ROM up to 131 degrees, with  pain continuing to be primary limiting factor. PT sessions limited due to inability to perform supine tasks due to hip and knee pain.    Rehab Potential Good   PT Frequency 2x / week   PT Duration 6 weeks   PT Treatment/Interventions ADLs/Self Care Home Management;Cryotherapy;Electrical Stimulation;Iontophoresis 4mg /ml Dexamethasone;Moist Heat;Ultrasound;Neuromuscular re-education;Therapeutic exercise;Therapeutic activities;Functional mobility training;Patient/family education;Manual techniques;Passive range of motion;Vasopneumatic Device;Dry needling;Taping   PT Next Visit Plan Progress RTC/flexion strengthening, abduction ROM   Consulted and Agree with Plan of Care Patient      Patient will benefit from skilled therapeutic intervention in order to improve the following deficits and impairments:  Pain, Impaired UE functional use, Postural dysfunction, Decreased range of motion, Decreased strength  Visit Diagnosis: Acute pain of right shoulder  Abnormal posture  Muscle weakness (generalized)     Problem List Patient Active Problem List   Diagnosis Date Noted  . 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 Heterozygous factor V Leiden mutation ([place]) 11/06/2015      Laureen Abrahams, PT, DPT 05/02/16 2:50 PM   [facility] [address]  [place], [place], [address] Phone: [phone]   Fax:  [phone]  Name: Gloria Lambert MRN: [id number] Date of Birth: 10-May-1946

## 2016-05-07 ENCOUNTER — Ambulatory Visit: Payer: Medicare Other

## 2016-05-07 DIAGNOSIS — M6281 Muscle weakness (generalized): Secondary | ICD-10-CM

## 2016-05-07 DIAGNOSIS — M25511 Pain in right shoulder: Secondary | ICD-10-CM | POA: Diagnosis not present

## 2016-05-07 DIAGNOSIS — R293 Abnormal posture: Secondary | ICD-10-CM

## 2016-05-07 NOTE — Therapy (Signed)
[facility] [address]  [place], [place], [address] Phone: [phone]   Fax:  [phone]  Physical Therapy Treatment  Patient Details  Name: Olufunke Blickenstaff MRN: [id number] Date of Birth: 10/15/1945 Referring Provider: Dr. Delrae Rend   Encounter Date: 05/07/2016      PT End of Session - 05/07/16 1412    Visit Number 6   Number of Visits 12   Date for PT Re-Evaluation 05/31/16   PT Start Time [id number]   PT Stop Time 1445   PT Time Calculation (min) 42 min   Activity Tolerance Patient tolerated treatment well   Behavior During Therapy [facility] for tasks assessed/performed      Past Medical History:  Diagnosis Date  . 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 Heterozygous factor V Leiden mutation ([place]) 11/06/2015  . Parathyroid abnormality ([place])    "have 2 nodules on them and they are watching these. Makes my calcium level elevated"    Past Surgical History:  Procedure Laterality Date  . arthroscopic knee surgery Right   . BILATERAL OOPHORECTOMY  1998  . BLADDER SUSPENSION  1998  . HIP ARTHROPLASTY Right    total hip replacement  . VAGINAL HYSTERECTOMY  1998    There were no vitals filed for this visit.      Subjective Assessment - 05/07/16 1408    Subjective Pt. reporting she is currently having the most pain while using R shoulder to manuever in bed.     Patient Stated Goals improve pain   Currently in Pain? Yes   Pain Score 4    Pain Location Shoulder   Pain Orientation Right   Pain Descriptors / Indicators Sharp   Multiple Pain Sites No            [facility] PT Assessment - 05/07/16 1418      Assessment   Medical Diagnosis R shoulder pain; thoracic kyphosis   Referring Provider Dr. Delrae Rend    Next MD Visit mid november              [facility] Adult PT Treatment/Exercise - 05/07/16 1431      Shoulder Exercises: Standing   External Rotation Both;15  reps   Theraband Level (Shoulder External Rotation) Level 3 (Green)   External Rotation Limitations in door frame with added scapular retraction   Internal Rotation 15 reps   Theraband Level (Shoulder Internal Rotation) Level 3 (Green)   Extension 15 reps;Both   Theraband Level (Shoulder Extension) Level 2 (Red)   Row Both;15 reps;Theraband   Theraband Level (Shoulder Row) Level 3 (Green)   Row Limitations 2 sets   Retraction Both;15 reps   Retraction Limitations in door frame; 5" hold     Shoulder Exercises: Pulleys   ABduction 3 minutes   ABduction Limitations scaption     Shoulder Exercises: Stretch   Other Shoulder Stretches low/mid door stretch 3x30" each position; only able to complete low and mid level with highest level limited due to pain           PT Education - 05/07/16 1455    Education provided Yes   Education Details shoulder IR, ER with green TB; pt. already has green TB    Person(s) Educated Patient   Methods Explanation;Demonstration;Handout   Comprehension Verbalized understanding;Returned demonstration;Need further instruction             PT Long Term Goals - 05/07/16 1446  PT LONG TERM GOAL #1   Title independent with HEP (05/31/16)   Time 6   Period Weeks   Status On-going     PT LONG TERM GOAL #2   Title improve R shoulder abduction AROM to 150 degrees for improved function and motion (05/31/16)   Time 6   Period Weeks   Status On-going     PT LONG TERM GOAL #3   Title demonstrate at least 4/5 RUE shoulder strength for improved function (05/31/16)   Time 6   Period Weeks   Status On-going     PT LONG TERM GOAL #4   Title report ability to perform ADLs without increase in pain for improved function with ADLs (05/31/16)   Time 6   Period Weeks   Status On-going  Pt. reporting repositioning in bed still causes pain at this point.                 Plan - 05/07/16 1413    Clinical Impression Statement Pt. reporting she is  nearly pain free with daily activities with exception of maneuvering in bed with R shoulder.  Pt. will not return to therapy till 11.7.17 due to trip to [employer].  Pt. tolerated progression of scapular and RTC strengthening activity today pain free with IR/ER with green TB.  R shoulder IR/ER with green TB added to HEP today via handout.  Pt. rescheduled MD f/u for mid November however unable to recall f/u date.  Pt. instructed to inform therapist upon return of MD appointment date.  Pt. progressing at this point and will plan to continue strengthening and ROM activities upon return to therapy.   PT Treatment/Interventions ADLs/Self Care Home Management;Cryotherapy;Electrical Stimulation;Iontophoresis 4mg /ml Dexamethasone;Moist Heat;Ultrasound;Neuromuscular re-education;Therapeutic exercise;Therapeutic activities;Functional mobility training;Patient/family education;Manual techniques;Passive range of motion;Vasopneumatic Device;Dry needling;Taping   PT Next Visit Plan Progress RTC/flexion strengthening, abduction ROM      Patient will benefit from skilled therapeutic intervention in order to improve the following deficits and impairments:  Pain, Impaired UE functional use, Postural dysfunction, Decreased range of motion, Decreased strength  Visit Diagnosis: Acute pain of right shoulder  Abnormal posture  Muscle weakness (generalized)     Problem List Patient Active Problem List   Diagnosis Date Noted  . 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 Heterozygous factor V Leiden mutation ([place]) 11/06/2015    Bess Harvest, PTA 05/07/16 3:08 PM  [facility] [address]  [place], [place], [address] Phone: [phone]   Fax:  [phone]  Name: Caya Hofmann MRN: [id number] Date of Birth: 1946/04/15

## 2016-05-16 ENCOUNTER — Ambulatory Visit: Payer: Medicare Other

## 2016-05-21 ENCOUNTER — Ambulatory Visit: Payer: Medicare Other | Attending: Internal Medicine

## 2016-05-21 DIAGNOSIS — M6281 Muscle weakness (generalized): Secondary | ICD-10-CM | POA: Diagnosis present

## 2016-05-21 DIAGNOSIS — M25511 Pain in right shoulder: Secondary | ICD-10-CM | POA: Diagnosis present

## 2016-05-21 DIAGNOSIS — R293 Abnormal posture: Secondary | ICD-10-CM

## 2016-05-21 NOTE — Therapy (Signed)
[facility] [address]  [place], [place], [address] Phone: [phone]   Fax:  [phone]  Physical Therapy Treatment  Patient Details  Name: Gloria Lambert MRN: [id number] Date of Birth: [birth year] Referring Provider: Dr. Delrae Rend  Encounter Date: 05/21/2016      PT End of Session - 05/21/16 1410    Visit Number 7   Number of Visits 12   Date for PT Re-Evaluation 05/31/16   PT Start Time 1402   PT Stop Time 1442   PT Time Calculation (min) 40 min   Activity Tolerance Patient tolerated treatment well   Behavior During Therapy [facility] for tasks assessed/performed      Past Medical History:  Diagnosis Date  . 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 Heterozygous factor V Leiden mutation ([place]) 11/06/2015  . Parathyroid abnormality ([place])    "have 2 nodules on them and they are watching these. Makes my calcium level elevated"    Past Surgical History:  Procedure Laterality Date  . arthroscopic knee surgery Right   . BILATERAL OOPHORECTOMY  1998  . BLADDER SUSPENSION  1998  . HIP ARTHROPLASTY Right    total hip replacement  . VAGINAL HYSTERECTOMY  1998    There were no vitals filed for this visit.      Subjective Assessment - 05/21/16 1407    Subjective Pt. reporting she has not been inconsistent with HEP activities over time with trip to [place].  Pt. still reporting pain with reaching movements.     Patient Stated Goals improve pain   Currently in Pain? No/denies   Pain Score 0-No pain   Multiple Pain Sites No            [facility] PT Assessment - 05/21/16 1418      Assessment   Medical Diagnosis R shoulder pain; thoracic kyphosis   Referring Provider Dr. Delrae Rend   Next MD Visit ~ mid december             [facility] Adult PT Treatment/Exercise - 05/21/16 1429      Shoulder Exercises: Standing   External Rotation Both;15 reps   Theraband Level (Shoulder  External Rotation) Level 3 (Green)  2 sets   External Rotation Limitations in door frame with added scapular retraction   Internal Rotation 15 reps   Theraband Level (Shoulder Internal Rotation) Level 3 (Green)  2 sets     Shoulder Exercises: Pulleys   Flexion 2 minutes   ABduction 3 minutes   ABduction Limitations scaption     Shoulder Exercises: Stretch   Other Shoulder Stretches low/mid door stretch 3x30" each position; only able to complete low and mid level with highest level limited due to pain   Other Shoulder Stretches Standing abduction/scaption with cane and mirror feedback to prevent substitutions     Manual Therapy   Manual Therapy Passive ROM   Passive ROM abduction/scaption in sitting to tolerance           PT Long Term Goals - 05/07/16 1446      PT LONG TERM GOAL #1   Title independent with HEP (05/31/16)   Time 6   Period Weeks   Status On-going     PT LONG TERM GOAL #2   Title improve R shoulder abduction AROM to 150 degrees for improved function and motion (05/31/16)   Time 6   Period Weeks   Status On-going  PT LONG TERM GOAL #3   Title demonstrate at least 4/5 RUE shoulder strength for improved function (05/31/16)   Time 6   Period Weeks   Status On-going     PT LONG TERM GOAL #4   Title report ability to perform ADLs without increase in pain for improved function with ADLs (05/31/16)   Time 6   Period Weeks   Status On-going  Pt. reporting repositioning in bed still causes pain at this point.                 Plan - 05/21/16 1411    Clinical Impression Statement Pt. returning to therapy after a week and a half break and trip to [place].  Today's treatment focused on abduction stretching and advancement of RTC strengthening activity and was well tolerated.  Pt. reporting she still has occasional pain, "reaching out to the side", however feels pain over trip was minimal and infrequent.  Pt. admitting to being inconsistent with HEP  activities over break and was instructed to start back with consistent HEP performance for continued benefit from therapy.   PT Treatment/Interventions ADLs/Self Care Home Management;Cryotherapy;Electrical Stimulation;Iontophoresis 4mg /ml Dexamethasone;Moist Heat;Ultrasound;Neuromuscular re-education;Therapeutic exercise;Therapeutic activities;Functional mobility training;Patient/family education;Manual techniques;Passive range of motion;Vasopneumatic Device;Dry needling;Taping   PT Next Visit Plan Progress RTC/flexion strengthening, abduction ROM      Patient will benefit from skilled therapeutic intervention in order to improve the following deficits and impairments:  Pain, Impaired UE functional use, Postural dysfunction, Decreased range of motion, Decreased strength  Visit Diagnosis: Acute pain of right shoulder  Abnormal posture  Muscle weakness (generalized)     Problem List Patient Active Problem List   Diagnosis Date Noted  . Chronic deep vein thrombosis (DVT) of left femoral vein ([place]) 11/06/2015  . Heterozygous factor V Leiden mutation ([place]) 11/06/2015    Bess Harvest, PTA 05/21/16 2:58 PM  [facility] [address]  [place], [place], [address] Phone: [phone]   Fax:  [phone]  Name: Khylee Guglielmo MRN: [id number] Date of Birth: 1946-05-26

## 2016-05-23 ENCOUNTER — Ambulatory Visit: Payer: Medicare Other | Admitting: Physical Therapy

## 2016-05-23 DIAGNOSIS — M6281 Muscle weakness (generalized): Secondary | ICD-10-CM

## 2016-05-23 DIAGNOSIS — M25511 Pain in right shoulder: Secondary | ICD-10-CM | POA: Diagnosis not present

## 2016-05-23 DIAGNOSIS — R293 Abnormal posture: Secondary | ICD-10-CM

## 2016-05-23 NOTE — Therapy (Signed)
[facility] [address]  [place], [place], [address] Phone: [phone]   Fax:  [phone]  Physical Therapy Treatment  Patient Details  Name: Gloria Lambert MRN: [id number] Date of Birth: 01-19-1946 Referring Provider: Dr. Delrae Rend  Encounter Date: 05/23/2016      PT End of Session - 05/23/16 1527    Visit Number 8   Number of Visits 12   Date for PT Re-Evaluation 05/31/16   PT Start Time [id number]   PT Stop Time 1526   PT Time Calculation (min) 41 min   Activity Tolerance Patient tolerated treatment well   Behavior During Therapy [facility] for tasks assessed/performed      Past Medical History:  Diagnosis Date  . 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 Heterozygous factor V Leiden mutation ([place]) 11/06/2015  . Parathyroid abnormality ([place])    "have 2 nodules on them and they are watching these. Makes my calcium level elevated"    Past Surgical History:  Procedure Laterality Date  . arthroscopic knee surgery Right   . BILATERAL OOPHORECTOMY  1998  . BLADDER SUSPENSION  1998  . HIP ARTHROPLASTY Right    total hip replacement  . VAGINAL HYSTERECTOMY  1998    There were no vitals filed for this visit.      Subjective Assessment - 05/23/16 1449    Subjective bicep has been bothering her some.  it's sore today.  shoulder is still sore.  MD: 06/21/16   Limitations House hold activities;Lifting   Patient Stated Goals improve pain   Currently in Pain? No/denies   Pain Score --  up to 5/10 past couple days            [facility] PT Assessment - 05/23/16 1523      AROM   Right Shoulder Flexion 131 Degrees   Right Shoulder ABduction 126 Degrees                     [facility] Adult PT Treatment/Exercise - 05/23/16 1452      Self-Care   Self-Care Other Self-Care Comments   Other Self-Care Comments  discussed possiblilty of holding, d/c v/s renew with pt; pt  verbalized understanding     Shoulder Exercises: ROM/Strengthening   UBE (Upper Arm Bike) L3.0 x 6 min (3' fwd/3' bwd)     Shoulder Exercises: Stretch   Other Shoulder Stretches low/mid door stretch 3x30" each position     Modalities   Modalities Ultrasound     Ultrasound   Ultrasound Location L biceps   Ultrasound Parameters 1 mHz, 100%, 1.0 w/cm2 x 8 min in sitting   Ultrasound Goals Edema;Pain     Manual Therapy   Manual Therapy Soft tissue mobilization;Myofascial release   Manual therapy comments seated   Soft tissue mobilization L biceps   Myofascial Release L biceps                     PT Long Term Goals - 05/07/16 1446      PT LONG TERM GOAL #1   Title independent with HEP (05/31/16)   Time 6   Period Weeks   Status On-going     PT LONG TERM GOAL #2   Title improve R shoulder abduction AROM to 150 degrees for improved function and motion (05/31/16)   Time 6   Period Weeks   Status On-going     PT LONG  TERM GOAL #3   Title demonstrate at least 4/5 RUE shoulder strength for improved function (05/31/16)   Time 6   Period Weeks   Status On-going     PT LONG TERM GOAL #4   Title report ability to perform ADLs without increase in pain for improved function with ADLs (05/31/16)   Time 6   Period Weeks   Status On-going  Pt. reporting repositioning in bed still causes pain at this point.                 Plan - 05/23/16 1527    Clinical Impression Statement Pt's AROM essentially unchanged with continued episodes of pain.  Due to travel pt has had inconsistent attendance with PT and therefore feel she may benefit from more consistent PT.  Discussed renew v/s d/c with pt today and pt to consider options and will come to agreement next week.  This PT willing to try PT 2x/wk x 4 more weeks if pt wants, but also open to d/c as limited progress has been made.  Session today focused on pain and tightness in L biceps.   PT Treatment/Interventions  ADLs/Self Care Home Management;Cryotherapy;Electrical Stimulation;Iontophoresis 4mg /ml Dexamethasone;Moist Heat;Ultrasound;Neuromuscular re-education;Therapeutic exercise;Therapeutic activities;Functional mobility training;Patient/family education;Manual techniques;Passive range of motion;Vasopneumatic Device;Dry needling;Taping   PT Next Visit Plan Progress RTC/flexion strengthening, begin checking goals and renew v/s d/c.   Consulted and Agree with Plan of Care Patient      Patient will benefit from skilled therapeutic intervention in order to improve the following deficits and impairments:  Pain, Impaired UE functional use, Postural dysfunction, Decreased range of motion, Decreased strength  Visit Diagnosis: Acute pain of right shoulder  Abnormal posture  Muscle weakness (generalized)     Problem List Patient Active Problem List   Diagnosis Date Noted  . 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 Heterozygous factor V Leiden mutation ([place]) 11/06/2015       Laureen Abrahams, PT, DPT 05/23/16 3:32 PM    [facility] [address]  [place], [place], [address] Phone: [phone]   Fax:  [phone]  Name: Gloria Lambert MRN: [id number] Date of Birth: 10/27/1945

## 2016-05-28 ENCOUNTER — Ambulatory Visit: Payer: Medicare Other

## 2016-05-28 DIAGNOSIS — R293 Abnormal posture: Secondary | ICD-10-CM

## 2016-05-28 DIAGNOSIS — M6281 Muscle weakness (generalized): Secondary | ICD-10-CM

## 2016-05-28 DIAGNOSIS — M25511 Pain in right shoulder: Secondary | ICD-10-CM

## 2016-05-28 NOTE — Therapy (Signed)
[facility] [address]  [place], [place], [address] Phone: [phone]   Fax:  [phone]  Physical Therapy Treatment  Patient Details  Name: Gloria Lambert MRN: [id number] Date of Birth: March 25, 1946 Referring Provider: Dr. Delrae Rend  Encounter Date: 05/28/2016      PT End of Session - 05/28/16 1416    Visit Number 9   Number of Visits 12   Date for PT Re-Evaluation 05/31/16   PT Start Time [id number]  Pt. arrived late.   PT Stop Time 1446   PT Time Calculation (min) 38 min   Activity Tolerance Patient tolerated treatment well   Behavior During Therapy [facility] for tasks assessed/performed      Past Medical History:  Diagnosis Date  . 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 Heterozygous factor V Leiden mutation ([place]) 11/06/2015  . Parathyroid abnormality ([place])    "have 2 nodules on them and they are watching these. Makes my calcium level elevated"    Past Surgical History:  Procedure Laterality Date  . arthroscopic knee surgery Right   . BILATERAL OOPHORECTOMY  1998  . BLADDER SUSPENSION  1998  . HIP ARTHROPLASTY Right    total hip replacement  . VAGINAL HYSTERECTOMY  1998    There were no vitals filed for this visit.      Subjective Assessment - 05/28/16 1409    Subjective Pt. reporting increased R shoulder soreness over this past week.     Patient Stated Goals improve pain   Currently in Pain? No/denies   Pain Score 0-No pain   Multiple Pain Sites No            [facility] PT Assessment - 05/28/16 1432      AROM   Right Shoulder Flexion 144 Degrees   Right Shoulder ABduction 136 Degrees  142 dg scaption      Today's treatment:  Therex: UBE: lvl 3.0, 3 min each way   Manual:  R shoulder inferior, posterior mobs grade III; in L sidelying R anterior shoulder/biceps STM   ROM testing   Goal testing   Therex: B scapular retraction/extenison with green  TB x 10 reps; only mild R shoulder pain with this B scapular retraction (with bent elbows) with green TB x 10 reps; moderate R shoulder pain with this           PT Long Term Goals - 05/28/16 1418      PT LONG TERM GOAL #1   Title independent with HEP (05/31/16)   Time 6   Period Weeks   Status Achieved     PT LONG TERM GOAL #2   Title improve R shoulder abduction AROM to 150 degrees for improved function and motion (05/31/16)   Time 6   Period Weeks   Status On-going  11.14.17: 136 dg abduction AROM      PT LONG TERM GOAL #3   Title demonstrate at least 4/5 RUE shoulder strength for improved function (05/31/16)   Time 6   Period Weeks   Status On-going     PT LONG TERM GOAL #4   Title report ability to perform ADLs without increase in pain for improved function with ADLs (05/31/16)   Time 6   Period Weeks   Status On-going  Pt. reporting repositioning in bed still causes pain at this point, lowering to toilet with R UE, and drying upper trunk off with towel following shower.  Pain is centered in biciptal groove/ant. deltoid area.                   Plan - 05/28/16 1418    Clinical Impression Statement Pt. reporting continued R shoulder dull, soreness in bicipital groove region today which she states, "has been a problem for the past week".  Pt. still reporting pain with using R arm descending to toilet, putting on coat, drying off upper trunk with towel following shower, and opening front door.  Pt. unable to meet abduction AROM goal today with testing only at 136 dg AROM abduction.    Pt. reporting retraction HEP activity with blue TB has been, "bothering" R shoulder thus pt. instructed to down-grade resistance to green TB for this.  Possibility of d/c discussed with pt. with pt. seeming undecided whether she wishes to continued therapy.  Pt. chief complaint at this point is using R UE to, "scooch" with bed mobility.  When asked what pt. wants to focus on with therapy  in remaining visits, pt. reporting, "she just wants to get rid of the pain".  Pt. to return on 11/16 for visit number 10 and date for re-eval. is on 11/17.     PT Treatment/Interventions ADLs/Self Care Home Management;Cryotherapy;Electrical Stimulation;Iontophoresis 4mg /ml Dexamethasone;Moist Heat;Ultrasound;Neuromuscular re-education;Therapeutic exercise;Therapeutic activities;Functional mobility training;Patient/family education;Manual techniques;Passive range of motion;Vasopneumatic Device;Dry needling;Taping   PT Next Visit Plan G-code; FOTO; Progress RTC/flexion strengthening, begin checking goals and renew v/s d/c.      Patient will benefit from skilled therapeutic intervention in order to improve the following deficits and impairments:  Pain, Impaired UE functional use, Postural dysfunction, Decreased range of motion, Decreased strength  Visit Diagnosis: Acute pain of right shoulder  Abnormal posture  Muscle weakness (generalized)     Problem List Patient Active Problem List   Diagnosis Date Noted  . 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 Heterozygous factor V Leiden mutation ([place]) 11/06/2015    Gloria Lambert, PTA 05/28/16 3:14 PM  [facility] [address]  [place], [place], [address] Phone: [phone]   Fax:  [phone]  Name: Gloria Lambert MRN: [id number] Date of Birth: Dec 15, 1945

## 2016-05-30 ENCOUNTER — Ambulatory Visit: Payer: Medicare Other | Admitting: Physical Therapy

## 2016-05-30 DIAGNOSIS — R293 Abnormal posture: Secondary | ICD-10-CM

## 2016-05-30 DIAGNOSIS — M25511 Pain in right shoulder: Secondary | ICD-10-CM

## 2016-05-30 DIAGNOSIS — M6281 Muscle weakness (generalized): Secondary | ICD-10-CM

## 2016-05-30 NOTE — Therapy (Signed)
[facility] [address]  [place], [place], [address] Phone: [phone]   Fax:  [phone]  Physical Therapy Treatment  Patient Details  Name: Gloria Lambert MRN: [id number] Date of Birth: 04-23-1946 Referring Provider: Dr. Delrae Rend  Encounter Date: 05/30/2016      PT End of Session - 05/30/16 1430    Visit Number 10   PT Start Time 2706   PT Stop Time 1425   PT Time Calculation (min) 20 min   Activity Tolerance Patient tolerated treatment well   Behavior During Therapy [facility] for tasks assessed/performed      Past Medical History:  Diagnosis Date  . 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 Heterozygous factor V Leiden mutation ([place]) 11/06/2015  . Parathyroid abnormality ([place])    "have 2 nodules on them and they are watching these. Makes my calcium level elevated"    Past Surgical History:  Procedure Laterality Date  . arthroscopic knee surgery Right   . BILATERAL OOPHORECTOMY  1998  . BLADDER SUSPENSION  1998  . HIP ARTHROPLASTY Right    total hip replacement  . VAGINAL HYSTERECTOMY  1998    There were no vitals filed for this visit.      Subjective Assessment - 05/30/16 1406    Subjective Still having soreness.  Pain stays the same with ADLs.   Pertinent History Chronic DVT LLE   Limitations House hold activities;Lifting   Diagnostic tests none   Patient Stated Goals improve pain   Currently in Pain? Yes   Pain Score 6    Pain Location Shoulder   Pain Orientation Right   Pain Descriptors / Indicators Sore   Pain Type --  subacute   Pain Onset More than a month ago   Pain Frequency Intermittent   Aggravating Factors  external rotation   Pain Relieving Factors immobilization            [facility] PT Assessment - 05/30/16 1415      Observation/Other Assessments   Focus on Therapeutic Outcomes (FOTO)  55 (45% limited)     AROM   Right Shoulder  ABduction 150 Degrees  scaption     Strength   Right Shoulder Flexion 4/5   Right Shoulder ABduction 3+/5   Right Shoulder Internal Rotation 5/5   Right Shoulder External Rotation 4/5                     [facility] Adult PT Treatment/Exercise - 05/30/16 1408      Shoulder Exercises: ROM/Strengthening   UBE (Upper Arm Bike) L3.0 x 6 min (3' fwd/3' bwd)                     PT Long Term Goals - 05/30/16 1430      PT LONG TERM GOAL #1   Title independent with HEP (05/31/16)   Status Achieved     PT LONG TERM GOAL #2   Title improve R shoulder abduction AROM to 150 degrees for improved function and motion (05/31/16)   Status Achieved  scaption     PT LONG TERM GOAL #3   Title demonstrate at least 4/5 RUE shoulder strength for improved function (05/31/16)   Baseline 05/30/16: all met except abduction, flexion with pain   Status Partially Met     PT LONG TERM GOAL #4   Title report ability to perform ADLs without increase in pain  for improved function with ADLs (05/31/16)   Baseline 04-Jun-2016: no increase in pain, but pain remains elevated   Status Achieved               Plan - 06-04-2016 1431    Clinical Impression Statement Pt has limited progress so far with PT and pain continues to be elevated.  At this time recommending d/c from PT and referral to orthopedic specialist (pt requesting Dr. Ninfa Linden).   PT Treatment/Interventions ADLs/Self Care Home Management;Cryotherapy;Electrical Stimulation;Iontophoresis '4mg'$ /ml Dexamethasone;Moist Heat;Ultrasound;Neuromuscular re-education;Therapeutic exercise;Therapeutic activities;Functional mobility training;Patient/family education;Manual techniques;Passive range of motion;Vasopneumatic Device;Dry needling;Taping   PT Next Visit Plan d/c PT today      Patient will benefit from skilled therapeutic intervention in order to improve the following deficits and impairments:  Pain, Impaired UE functional use,  Postural dysfunction, Decreased range of motion, Decreased strength  Visit Diagnosis: Acute pain of right shoulder  Abnormal posture  Muscle weakness (generalized)       G-Codes - Jun 04, 2016 1434    Functional Assessment Tool Used FOTO 45% limited   Functional Limitation Carrying, moving and handling objects   Carrying, Moving and Handling Objects Goal Status (Z6010) At least 20 percent but less than 40 percent impaired, limited or restricted   Carrying, Moving and Handling Objects Discharge Status [phone]) At least 40 percent but less than 60 percent impaired, limited or restricted      Problem List Patient Active Problem List   Diagnosis Date Noted  . 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 Heterozygous factor V Leiden mutation ([place]) 11/06/2015      Laureen Abrahams, PT, DPT 2016/06/04 2:36 PM   [facility] [address]  [place], [place], [address] Phone: [phone]   Fax:  [phone]  Name: Gloria Lambert MRN: [id number] Date of Birth: 1945/12/03    PHYSICAL THERAPY DISCHARGE SUMMARY  Visits from Start of Care: 10  Current functional level related to goals / functional outcomes: See above; pt with limited progress   Remaining deficits: See above; pt continues to report elevated pain affecting ADLs.  Strength and ROM slightly improved but function remains limited due to pain.   Education / Equipment: HEP, posture  Plan: Patient agrees to discharge.  Patient goals were partially met. Patient is being discharged due to lack of progress.  ?????    Recommend referral to orthopedic specialist, pt requesting Dr. Ninfa Linden.  Laureen Abrahams, PT, DPT June 04, 2016 2:38 PM  [facility] [place], [place] [address]  [phone] (office) [phone] (fax)

## 2016-06-04 ENCOUNTER — Ambulatory Visit: Payer: Medicare Other

## 2016-06-11 ENCOUNTER — Ambulatory Visit: Payer: Medicare Other

## 2016-06-13 ENCOUNTER — Ambulatory Visit: Payer: Medicare Other | Admitting: Physical Therapy

## 2016-07-09 ENCOUNTER — Other Ambulatory Visit: Payer: Self-pay | Admitting: *Deleted

## 2016-07-09 DIAGNOSIS — I82512 Chronic embolism and thrombosis of left femoral vein: Secondary | ICD-10-CM

## 2016-07-10 ENCOUNTER — Other Ambulatory Visit (HOSPITAL_BASED_OUTPATIENT_CLINIC_OR_DEPARTMENT_OTHER): Payer: Medicare Other

## 2016-07-10 ENCOUNTER — Ambulatory Visit (HOSPITAL_BASED_OUTPATIENT_CLINIC_OR_DEPARTMENT_OTHER): Payer: Medicare Other | Admitting: Hematology & Oncology

## 2016-07-10 VITALS — BP 137/61 | HR 70 | Temp 98.5°F | Resp 18 | Wt 191.8 lb

## 2016-07-10 DIAGNOSIS — I82512 Chronic embolism and thrombosis of left femoral vein: Secondary | ICD-10-CM

## 2016-07-10 DIAGNOSIS — D6851 Activated protein C resistance: Secondary | ICD-10-CM

## 2016-07-10 DIAGNOSIS — Z7901 Long term (current) use of anticoagulants: Secondary | ICD-10-CM

## 2016-07-10 LAB — COMPREHENSIVE METABOLIC PANEL
ALT: 14 U/L (ref 0–55)
ANION GAP: 6 meq/L (ref 3–11)
AST: 19 U/L (ref 5–34)
Albumin: 3.9 g/dL (ref 3.5–5.0)
Alkaline Phosphatase: 52 U/L (ref 40–150)
BILIRUBIN TOTAL: 1.03 mg/dL (ref 0.20–1.20)
BUN: 14.7 mg/dL (ref 7.0–26.0)
CO2: 25 meq/L (ref 22–29)
CREATININE: 0.8 mg/dL (ref 0.6–1.1)
Calcium: 10.3 mg/dL (ref 8.4–10.4)
Chloride: 111 mEq/L — ABNORMAL HIGH (ref 98–109)
EGFR: 82 mL/min/{1.73_m2} — ABNORMAL LOW (ref >90)
GLUCOSE: 102 mg/dL (ref 70–140)
Potassium: 4.4 mEq/L (ref 3.5–5.1)
Sodium: 143 mEq/L (ref 136–145)
TOTAL PROTEIN: 7.3 g/dL (ref 6.4–8.3)

## 2016-07-10 LAB — CBC WITH DIFFERENTIAL (CANCER CENTER ONLY)
BASO#: 0 10*3/uL (ref 0.0–0.2)
BASO%: 0.2 % (ref 0.0–2.0)
EOS%: 3.2 % (ref 0.0–7.0)
Eosinophils Absolute: 0.1 10*3/uL (ref 0.0–0.5)
HEMATOCRIT: 38.5 % (ref 34.8–46.6)
HGB: 13.2 g/dL (ref 11.6–15.9)
LYMPH#: 1.2 10*3/uL (ref 0.9–3.3)
LYMPH%: 26.6 % (ref 14.0–48.0)
MCH: 30.1 pg (ref 26.0–34.0)
MCHC: 34.3 g/dL (ref 32.0–36.0)
MCV: 88 fL (ref 81–101)
MONO#: 0.2 10*3/uL (ref 0.1–0.9)
MONO%: 5.3 % (ref 0.0–13.0)
NEUT#: 2.8 10*3/uL (ref 1.5–6.5)
NEUT%: 64.7 % (ref 39.6–80.0)
PLATELETS: 157 10*3/uL (ref 145–400)
RBC: 4.38 10*6/uL (ref 3.70–5.32)
RDW: 14.1 % (ref 11.1–15.7)
WBC: 4.3 10*3/uL (ref 3.9–10.0)

## 2016-07-10 NOTE — Progress Notes (Signed)
Hematology and Oncology Follow Up Visit  Gloria Lambert [id number] 1945/07/25 70 y.o. 07/10/2016   Principle Diagnosis:   Thrombus of the left femoral vein  Heterozygous for factor V Leiden mutation  Current Therapy:    Xarelto 20 mg by mouth daily-finish 6 months in August 2017  Maintenance Xarelto 10 mg by mouth daily-1 year to finish up in September 2018     Interim History:  Gloria Lambert is back for follow-up. 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   We last saw her back in August. Since then, she's been busy. She's been traveling. She just come back in [place] for Christmas. She had a great time up in [place]. Thankfully, she flu.   We did go ahead and do another Doppler of her leg with her last visit.. This shows the chronic nonobstructing thrombus in the mid left common femoral vein. There is no change from last study.  Her last d-dimer was 0.4.  She does wear compression stockings. She really has not noted much swelling in the left leg.  She's had no problems with fatigue or weakness. She's had no chest wall pain. She's had no bleeding or bruising. She's had no fever.  Overall, her performance status is ECOG 0.  Medications:  Current Outpatient Prescriptions:  .  calcium carbonate (TUMS - DOSED IN MG ELEMENTAL CALCIUM) 500 MG chewable tablet, Chew 1 tablet by mouth 3 times/day as needed-between meals & bedtime for indigestion or heartburn., Disp: , Rfl:  .  cholecalciferol (VITAMIN D) 1000 units tablet, Take 1,000 Units by mouth daily., Disp: , Rfl:  .  Omega-3 Fatty Acids (FISH OIL) 1200 MG CAPS, Take 1 capsule by mouth daily., Disp: , Rfl:  .  Probiotic Product (PROBIOTIC-10 PO), Take 1 capsule by mouth daily., Disp: , Rfl:  .  ranitidine (ZANTAC) 300 MG tablet, Take 300 mg by mouth daily. , Disp: , Rfl:  .  rivaroxaban (XARELTO) 10 MG TABS tablet, Take 1 tablet (10 mg total) by mouth daily with supper., Disp: 30 tablet, Rfl: 12  Allergies:  Allergies    Allergen Reactions  . Penicillins   . Latex Rash    rash  . Penicillin G Rash    Past Medical History, Surgical history, Social history, and Family History were reviewed and updated.  Review of Systems: As above  Physical Exam:  weight is 191 lb 12.8 oz (87 kg). Her oral temperature is 98.5 F (36.9 C). Her blood pressure is 137/61 and her pulse is 70. Her respiration is 18 and oxygen saturation is 99%.   Wt Readings from Last 3 Encounters:  07/10/16 191 lb 12.8 oz (87 kg)  03/06/16 185 lb (83.9 kg)  03/01/16 187 lb 3.2 oz (84.9 kg)     Head and neck exam shows no ocular or oral lesions. There are no palpable cervical or supraclavicular lymph nodes. Lungs are clear bilaterally. Cardiac exam regular rate and rhythm with no murmurs rubs or bruits. Abdomen is soft. She is mildly obese. She has good bowel sounds. There is no fluid wave. There is no palpable hepatosplenomegaly. Back exam shows no tenderness over the spine, ribs or hips. Extremities she has compression stockings on both legs. After the stockings were removed, she had good pulses in her distal extremities. She had no palpable venous cord in the legs. She may have had some superficial thrombophlebitis on the dorsum of the left foot. Neurological exam is nonfocal.   Lab Results  Component Value Date  WBC 4.3 07/10/2016   HGB 13.2 07/10/2016   HCT 38.5 07/10/2016   MCV 88 07/10/2016   PLT 157 07/10/2016     Chemistry      Component Value Date/Time   NA 142 03/06/2016 1030   K 4.7 03/06/2016 1030   CL 105 08/18/2015 1508   CO2 24 03/06/2016 1030   BUN 14.0 03/06/2016 1030   CREATININE 0.6 03/06/2016 1030      Component Value Date/Time   CALCIUM 9.5 03/06/2016 1030   ALKPHOS 57 03/06/2016 1030   AST 18 03/06/2016 1030   ALT 14 03/06/2016 1030   BILITOT 0.91 03/06/2016 1030         Impression and Plan: Gloria Lambert is a 70 year old white female. She is heterozygous for the factor V Leiden. She has a DVT in  the left leg. I'm sure she will have a chronic thrombus. She is wearing her compression stockings. Her leg looks quite good.    we will plan to get her back in 6 months. She will complete 1 year of maintenance Xarelto in September.  I don't think we need any Dopplers.  I will she had a good time up in [place] for Christmas. It was truly a white Christmas.   Volanda Napoleon, MD 12/27/201712:35 PM

## 2016-07-11 LAB — D-DIMER, QUANTITATIVE: D-DIMER: 0.4 mg/L FEU (ref 0.00–0.49)

## 2016-07-23 DIAGNOSIS — M25511 Pain in right shoulder: Secondary | ICD-10-CM | POA: Diagnosis not present

## 2016-07-23 DIAGNOSIS — G8929 Other chronic pain: Secondary | ICD-10-CM | POA: Diagnosis not present

## 2016-07-23 DIAGNOSIS — B372 Candidiasis of skin and nail: Secondary | ICD-10-CM | POA: Diagnosis not present

## 2016-08-07 DIAGNOSIS — Z1231 Encounter for screening mammogram for malignant neoplasm of breast: Secondary | ICD-10-CM | POA: Diagnosis not present

## 2016-08-07 DIAGNOSIS — Z803 Family history of malignant neoplasm of breast: Secondary | ICD-10-CM | POA: Diagnosis not present

## 2016-08-07 DIAGNOSIS — M7541 Impingement syndrome of right shoulder: Secondary | ICD-10-CM | POA: Diagnosis not present

## 2016-09-16 DIAGNOSIS — M7541 Impingement syndrome of right shoulder: Secondary | ICD-10-CM | POA: Diagnosis not present

## 2016-10-02 DIAGNOSIS — B372 Candidiasis of skin and nail: Secondary | ICD-10-CM | POA: Diagnosis not present

## 2016-12-18 DIAGNOSIS — Z86718 Personal history of other venous thrombosis and embolism: Secondary | ICD-10-CM | POA: Diagnosis not present

## 2016-12-18 DIAGNOSIS — E210 Primary hyperparathyroidism: Secondary | ICD-10-CM | POA: Diagnosis not present

## 2016-12-18 DIAGNOSIS — E559 Vitamin D deficiency, unspecified: Secondary | ICD-10-CM | POA: Diagnosis not present

## 2016-12-18 DIAGNOSIS — M810 Age-related osteoporosis without current pathological fracture: Secondary | ICD-10-CM | POA: Diagnosis not present

## 2016-12-18 DIAGNOSIS — E042 Nontoxic multinodular goiter: Secondary | ICD-10-CM | POA: Diagnosis not present

## 2016-12-18 DIAGNOSIS — K219 Gastro-esophageal reflux disease without esophagitis: Secondary | ICD-10-CM | POA: Diagnosis not present

## 2016-12-25 DIAGNOSIS — Z23 Encounter for immunization: Secondary | ICD-10-CM | POA: Diagnosis not present

## 2016-12-25 DIAGNOSIS — Z0000 Encounter for general adult medical examination without abnormal findings: Secondary | ICD-10-CM | POA: Diagnosis not present

## 2017-01-02 DIAGNOSIS — D2339 Other benign neoplasm of skin of other parts of face: Secondary | ICD-10-CM | POA: Diagnosis not present

## 2017-01-02 DIAGNOSIS — D225 Melanocytic nevi of trunk: Secondary | ICD-10-CM | POA: Diagnosis not present

## 2017-01-02 DIAGNOSIS — L814 Other melanin hyperpigmentation: Secondary | ICD-10-CM | POA: Diagnosis not present

## 2017-01-02 DIAGNOSIS — L237 Allergic contact dermatitis due to plants, except food: Secondary | ICD-10-CM | POA: Diagnosis not present

## 2017-01-02 DIAGNOSIS — L720 Epidermal cyst: Secondary | ICD-10-CM | POA: Diagnosis not present

## 2017-01-02 DIAGNOSIS — D1801 Hemangioma of skin and subcutaneous tissue: Secondary | ICD-10-CM | POA: Diagnosis not present

## 2017-01-02 DIAGNOSIS — L821 Other seborrheic keratosis: Secondary | ICD-10-CM | POA: Diagnosis not present

## 2017-01-02 DIAGNOSIS — B372 Candidiasis of skin and nail: Secondary | ICD-10-CM | POA: Diagnosis not present

## 2017-01-02 DIAGNOSIS — L98499 Non-pressure chronic ulcer of skin of other sites with unspecified severity: Secondary | ICD-10-CM | POA: Diagnosis not present

## 2017-01-08 ENCOUNTER — Other Ambulatory Visit (HOSPITAL_BASED_OUTPATIENT_CLINIC_OR_DEPARTMENT_OTHER): Payer: Medicare Other

## 2017-01-08 ENCOUNTER — Ambulatory Visit (HOSPITAL_BASED_OUTPATIENT_CLINIC_OR_DEPARTMENT_OTHER): Payer: Medicare Other | Admitting: Hematology & Oncology

## 2017-01-08 VITALS — BP 134/68 | HR 78 | Temp 98.6°F | Resp 18 | Wt 192.0 lb

## 2017-01-08 DIAGNOSIS — Z7901 Long term (current) use of anticoagulants: Secondary | ICD-10-CM

## 2017-01-08 DIAGNOSIS — I82512 Chronic embolism and thrombosis of left femoral vein: Secondary | ICD-10-CM | POA: Diagnosis not present

## 2017-01-08 LAB — CMP (CANCER CENTER ONLY)
ALT(SGPT): 13 U/L (ref 10–47)
AST: 24 U/L (ref 11–38)
Albumin: 3.5 g/dL (ref 3.3–5.5)
Alkaline Phosphatase: 57 U/L (ref 26–84)
BILIRUBIN TOTAL: 1 mg/dL (ref 0.20–1.60)
BUN: 19 mg/dL (ref 7–22)
CO2: 28 meq/L (ref 18–33)
CREATININE: 0.7 mg/dL (ref 0.6–1.2)
Calcium: 10.7 mg/dL — ABNORMAL HIGH (ref 8.0–10.3)
Chloride: 108 mEq/L (ref 98–108)
Glucose, Bld: 88 mg/dL (ref 73–118)
Potassium: 4.3 mEq/L (ref 3.3–4.7)
SODIUM: 142 meq/L (ref 128–145)
TOTAL PROTEIN: 6.9 g/dL (ref 6.4–8.1)

## 2017-01-08 LAB — CBC WITH DIFFERENTIAL (CANCER CENTER ONLY)
BASO#: 0 10*3/uL (ref 0.0–0.2)
BASO%: 0.4 % (ref 0.0–2.0)
EOS%: 3.7 % (ref 0.0–7.0)
Eosinophils Absolute: 0.2 10*3/uL (ref 0.0–0.5)
HCT: 39.3 % (ref 34.8–46.6)
HEMOGLOBIN: 13.4 g/dL (ref 11.6–15.9)
LYMPH#: 0.9 10*3/uL (ref 0.9–3.3)
LYMPH%: 20.4 % (ref 14.0–48.0)
MCH: 30.6 pg (ref 26.0–34.0)
MCHC: 34.1 g/dL (ref 32.0–36.0)
MCV: 90 fL (ref 81–101)
MONO#: 0.3 10*3/uL (ref 0.1–0.9)
MONO%: 6.3 % (ref 0.0–13.0)
NEUT%: 69.2 % (ref 39.6–80.0)
NEUTROS ABS: 3.2 10*3/uL (ref 1.5–6.5)
PLATELETS: 163 10*3/uL (ref 145–400)
RBC: 4.38 10*6/uL (ref 3.70–5.32)
RDW: 13.2 % (ref 11.1–15.7)
WBC: 4.6 10*3/uL (ref 3.9–10.0)

## 2017-01-08 NOTE — Progress Notes (Signed)
Hematology and Oncology Follow Up Visit  Donetta Isaza [id number] Nov 20, 1945 71 y.o. 01/08/2017   Principle Diagnosis:   Thrombus of the left femoral vein  Heterozygous for factor V Leiden mutation  Current Therapy:    Xarelto 20 mg by mouth daily-finish 6 months in August 2017       Maintenance Xarelto 10 mg by mouth daily      Interim History:  Ms. Downard is back for follow-up. 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   She is doing quite well. Her left leg is not causing her too many issues. She does not like wearing the compression stocking. I told her that she probably on knees where the compression stocking when she is traveling long distances.  She will be going up to [place] in a week or so. She will be going camping.   She is on the low-dose Xarelto. I think we have to keep her on low-dose Xarelto. She has a chronic femoral thrombus. It is non-occlusive.  She's having no problems with the maintenance Xarelto. I talked her about staying on this after one year. She is okay with this.   She had mammogram done a month or so ago. This looked okay.   She's had no bleeding. 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 I [place] were this might be, from. I don't think it is related to her thrombus.   Overall, her performance status is ECOG 0.  Medications:  Current Outpatient Prescriptions:  .  calcium carbonate (TUMS - DOSED IN MG ELEMENTAL CALCIUM) 500 MG chewable tablet, Chew 1 tablet by mouth 3 times/day as needed-between meals & bedtime for indigestion or heartburn., Disp: , Rfl:  .  cholecalciferol (VITAMIN D) 1000 units tablet, Take 1,000 Units by mouth daily., Disp: , Rfl:  .  Omega-3 Fatty Acids (FISH OIL) 1200 MG CAPS, Take 1 capsule by mouth daily., Disp: , Rfl:  .  Probiotic Product (PROBIOTIC-10 PO), Take 1 capsule by mouth daily., Disp: , Rfl:  .  ranitidine (ZANTAC) 300 MG tablet, Take 300 mg by mouth daily. , Disp: , Rfl:  .  rivaroxaban (XARELTO) 10 MG  TABS tablet, Take 1 tablet (10 mg total) by mouth daily with supper., Disp: 30 tablet, Rfl: 12  Allergies:  Allergies  Allergen Reactions  . Penicillins   . Latex Rash    rash  . Penicillin G Rash    Past Medical History, Surgical history, Social history, and Family History were reviewed and updated.  Review of Systems: As above  Physical Exam:  weight is 192 lb (87.1 kg). Her oral temperature is 98.6 F (37 C). Her blood pressure is 134/68 and her pulse is 78. Her respiration is 18 and oxygen saturation is 97%.   Wt Readings from Last 3 Encounters:  01/08/17 192 lb (87.1 kg)  07/10/16 191 lb 12.8 oz (87 kg)  03/06/16 185 lb (83.9 kg)     Head and neck exam shows no ocular or oral lesions. There are no palpable cervical or supraclavicular lymph nodes. Lungs are clear bilaterally. Cardiac exam regular rate and rhythm with no murmurs rubs or bruits. Abdomen is soft. She is mildly obese. She has good bowel sounds. There is no fluid wave. There is no palpable hepatosplenomegaly. Back exam shows no tenderness over the spine, ribs or hips. Extremities she has compression stockings on both legs. After the stockings were removed, she had good pulses in her distal extremities. She had no palpable venous cord  in the legs. She may have had some superficial thrombophlebitis on the dorsum of the left foot. Neurological exam is nonfocal.   Lab Results  Component Value Date   WBC 4.6 01/08/2017   HGB 13.4 01/08/2017   HCT 39.3 01/08/2017   MCV 90 01/08/2017   PLT 163 01/08/2017     Chemistry      Component Value Date/Time   NA 142 01/08/2017 0942   NA 143 07/10/2016 1119   K 4.3 01/08/2017 0942   K 4.4 07/10/2016 1119   CL 108 01/08/2017 0942   CO2 28 01/08/2017 0942   CO2 25 07/10/2016 1119   BUN 19 01/08/2017 0942   BUN 14.7 07/10/2016 1119   CREATININE 0.7 01/08/2017 0942   CREATININE 0.8 07/10/2016 1119      Component Value Date/Time   CALCIUM 10.7 (H) 01/08/2017 0942    CALCIUM 10.3 07/10/2016 1119   ALKPHOS 57 01/08/2017 0942   ALKPHOS 52 07/10/2016 1119   AST 24 01/08/2017 0942   AST 19 07/10/2016 1119   ALT 13 01/08/2017 0942   ALT 14 07/10/2016 1119   BILITOT 1.00 01/08/2017 0942   BILITOT 1.03 07/10/2016 1119         Impression and Plan: Ms. Admire is a 71 year old white female. She is heterozygous for the factor V Leiden. She has a DVT in the left leg. I'm sure she will have a chronic thrombus.   We will see her back in 4 months. Again we will just keep her on the low-dose Xarelto. She does travel quite a bit.  I'm just having that she has done so well.  As far as he neuropathy is concerned, I told her that her family doctor probably can address this and she may need to go see a neurologist.   Volanda Napoleon, MD 6/27/201810:38 AM

## 2017-01-09 LAB — D-DIMER, QUANTITATIVE: D-DIMER: 0.41 mg/L FEU (ref 0.00–0.49)

## 2017-03-06 DIAGNOSIS — E042 Nontoxic multinodular goiter: Secondary | ICD-10-CM | POA: Diagnosis not present

## 2017-03-06 DIAGNOSIS — M810 Age-related osteoporosis without current pathological fracture: Secondary | ICD-10-CM | POA: Diagnosis not present

## 2017-03-06 DIAGNOSIS — E210 Primary hyperparathyroidism: Secondary | ICD-10-CM | POA: Diagnosis not present

## 2017-03-06 DIAGNOSIS — E559 Vitamin D deficiency, unspecified: Secondary | ICD-10-CM | POA: Diagnosis not present

## 2017-03-19 ENCOUNTER — Ambulatory Visit (HOSPITAL_COMMUNITY)
Admission: RE | Admit: 2017-03-19 | Discharge: 2017-03-19 | Disposition: A | Discharge status: Home / Self Care | Payer: Medicare Other | Source: Ambulatory Visit | Attending: Internal Medicine | Admitting: Internal Medicine

## 2017-03-24 ENCOUNTER — Other Ambulatory Visit: Payer: Self-pay | Admitting: Hematology & Oncology

## 2017-03-24 DIAGNOSIS — I82512 Chronic embolism and thrombosis of left femoral vein: Secondary | ICD-10-CM

## 2017-05-07 ENCOUNTER — Other Ambulatory Visit: Payer: Medicare Other

## 2017-05-07 ENCOUNTER — Ambulatory Visit: Payer: Medicare Other | Admitting: Hematology & Oncology

## 2017-05-15 ENCOUNTER — Ambulatory Visit (HOSPITAL_BASED_OUTPATIENT_CLINIC_OR_DEPARTMENT_OTHER): Payer: Medicare Other | Admitting: Hematology & Oncology

## 2017-05-15 ENCOUNTER — Other Ambulatory Visit (HOSPITAL_BASED_OUTPATIENT_CLINIC_OR_DEPARTMENT_OTHER): Payer: Medicare Other

## 2017-05-15 VITALS — BP 130/56 | HR 76 | Temp 99.0°F | Resp 18 | Wt 195.0 lb

## 2017-05-15 DIAGNOSIS — I82512 Chronic embolism and thrombosis of left femoral vein: Secondary | ICD-10-CM | POA: Diagnosis not present

## 2017-05-15 DIAGNOSIS — Z7901 Long term (current) use of anticoagulants: Secondary | ICD-10-CM

## 2017-05-15 LAB — CBC WITH DIFFERENTIAL (CANCER CENTER ONLY)
BASO#: 0 10*3/uL (ref 0.0–0.2)
BASO%: 0.3 % (ref 0.0–2.0)
EOS%: 2.5 % (ref 0.0–7.0)
Eosinophils Absolute: 0.2 10*3/uL (ref 0.0–0.5)
HCT: 38.6 % (ref 34.8–46.6)
HGB: 13.4 g/dL (ref 11.6–15.9)
LYMPH#: 1.2 10*3/uL (ref 0.9–3.3)
LYMPH%: 19.1 % (ref 14.0–48.0)
MCH: 30.8 pg (ref 26.0–34.0)
MCHC: 34.7 g/dL (ref 32.0–36.0)
MCV: 89 fL (ref 81–101)
MONO#: 0.4 10*3/uL (ref 0.1–0.9)
MONO%: 5.8 % (ref 0.0–13.0)
NEUT#: 4.4 10*3/uL (ref 1.5–6.5)
NEUT%: 72.3 % (ref 39.6–80.0)
PLATELETS: 165 10*3/uL (ref 145–400)
RBC: 4.35 10*6/uL (ref 3.70–5.32)
RDW: 13.3 % (ref 11.1–15.7)
WBC: 6.1 10*3/uL (ref 3.9–10.0)

## 2017-05-15 LAB — CMP (CANCER CENTER ONLY)
ALK PHOS: 51 U/L (ref 26–84)
ALT(SGPT): 28 U/L (ref 10–47)
AST: 28 U/L (ref 11–38)
Albumin: 3.9 g/dL (ref 3.3–5.5)
BUN: 16 mg/dL (ref 7–22)
CO2: 26 mEq/L (ref 18–33)
CREATININE: 0.7 mg/dL (ref 0.6–1.2)
Calcium: 10.3 mg/dL (ref 8.0–10.3)
Chloride: 106 mEq/L (ref 98–108)
Glucose, Bld: 99 mg/dL (ref 73–118)
POTASSIUM: 4.4 meq/L (ref 3.3–4.7)
Sodium: 148 mEq/L — ABNORMAL HIGH (ref 128–145)
TOTAL PROTEIN: 6.9 g/dL (ref 6.4–8.1)
Total Bilirubin: 1.1 mg/dl (ref 0.20–1.60)

## 2017-05-15 MED ORDER — CITALOPRAM HYDROBROMIDE 10 MG PO TABS: 10.0000 mg | ORAL_TABLET | Freq: Every day | ORAL | 3 refills | Status: DC

## 2017-05-15 NOTE — Progress Notes (Signed)
Hematology and Oncology Follow Up Visit  Gloria Lambert [id number] 1946/01/18 71 y.o. 05/15/2017   Principle Diagnosis:   Thrombus of the left femoral vein  Heterozygous for factor V Leiden mutation  Current Therapy:    Xarelto 20 mg by mouth daily-finish 6 months in August 2017       Maintenance Xarelto 10 mg by mouth daily      Interim History:  Gloria Lambert is back for follow-up. She is heterozygous for the Factor V Leiden mutation.   She is having a tough time right now. Her husband was diagnosed with extensive stage small cell lung cancer. He is getting chemotherapy. He is not doing all that well from what she says.  She is quite emotional. She has been this way for a while. I told her that it might be a good idea for her to try a small dose of antidepressant. I will see about sending her and some Celexa (10 mg by mouth daily).  She's had no leg pain. She has a Bakers cyst on the right knee. Her left leg does swell up on occasion. She is on maintenance Xarelto and doing well with this.  Her last Doppler was done back in August 2017. This showed a chronic nonocclusive thrombus in the left femoral vein.  She's had no cough. Is no chest wall pain. There is no hemoptysis. She has no shortness of breath.  There is no change in bowel or bladder habits.   Overall, her performance status is ECOG 1.  Medications:  Current Outpatient Prescriptions:  .  calcium carbonate (TUMS - DOSED IN MG ELEMENTAL CALCIUM) 500 MG chewable tablet, Chew 1 tablet by mouth 3 times/day as needed-between meals & bedtime for indigestion or heartburn., Disp: , Rfl:  .  cholecalciferol (VITAMIN D) 1000 units tablet, Take 1,000 Units by mouth daily., Disp: , Rfl:  .  citalopram (CELEXA) 10 MG tablet, Take 1 tablet (10 mg total) by mouth daily., Disp: 30 tablet, Rfl: 3 .  Omega-3 Fatty Acids (FISH OIL) 1200 MG CAPS, Take 1 capsule by mouth daily., Disp: , Rfl:  .  Probiotic Product (PROBIOTIC-10 PO), Take 1  capsule by mouth daily., Disp: , Rfl:  .  ranitidine (ZANTAC) 300 MG tablet, Take 300 mg by mouth daily. , Disp: , Rfl:  .  XARELTO 10 MG TABS tablet, TAKE 1 TABLET BY MOUTH DAILY WITH SUPPER, Disp: 30 tablet, Rfl: 7  Allergies:  Allergies  Allergen Reactions  . Penicillins   . Latex Rash    rash  . Penicillin G Rash    Past Medical History, Surgical history, Social history, and Family History were reviewed and updated.  Review of Systems: As stated in the interim history  Physical Exam:  weight is 195 lb (88.5 kg). Her oral temperature is 99 F (37.2 C). Her blood pressure is 130/56 (abnormal) and her pulse is 76. Her respiration is 18 and oxygen saturation is 99%.   Wt Readings from Last 3 Encounters:  05/15/17 195 lb (88.5 kg)  01/08/17 192 lb (87.1 kg)  07/10/16 191 lb 12.8 oz (87 kg)     Well-developed and well-nourished white female. Head and neck exam shows no ocular or oral lesions. There are no palpable cervical or supraclavicular lymph nodes. Lungs are clear bilaterally. Cardiac exam regular rate and rhythm with no murmurs, rubs or bruits. Abdomen is soft. She has good bowel sounds. There is no fluid wave. There is no palpable liver or spleen tip. Back  exam shows some slight kyphosis. She has no tenderness over the spine, ribs or hips. Extremities shows no clubbing, cyanosis or edema. She does have some slight swelling of the right leg. She per has a Bakers cyst. She has no venous cord in the legs. She has a negative Homans sign bilaterally. Neurological exam shows no focal neurological deficits.    Lab Results  Component Value Date   WBC 6.1 05/15/2017   HGB 13.4 05/15/2017   HCT 38.6 05/15/2017   MCV 89 05/15/2017   PLT 165 05/15/2017     Chemistry      Component Value Date/Time   NA 148 (H) 05/15/2017 1452   NA 143 07/10/2016 1119   K 4.4 05/15/2017 1452   K 4.4 07/10/2016 1119   CL 106 05/15/2017 1452   CO2 26 05/15/2017 1452   CO2 25 07/10/2016 1119    BUN 16 05/15/2017 1452   BUN 14.7 07/10/2016 1119   CREATININE 0.7 05/15/2017 1452   CREATININE 0.8 07/10/2016 1119      Component Value Date/Time   CALCIUM 10.3 05/15/2017 1452   CALCIUM 10.3 07/10/2016 1119   ALKPHOS 51 05/15/2017 1452   ALKPHOS 52 07/10/2016 1119   AST 28 05/15/2017 1452   AST 19 07/10/2016 1119   ALT 28 05/15/2017 1452   ALT 14 07/10/2016 1119   BILITOT 1.10 05/15/2017 1452   BILITOT 1.03 07/10/2016 1119         Impression and Plan: Gloria Lambert is a 71 year old white female. She is heterozygous for the factor V Leiden. She has a DVT in the left leg. I'm sure she will have a chronic thrombus.   I feel bad that she is under somewhat stress. Hopefully, her husband will improve. It sounds like he really has gone down since he was diagnosed with his lung cancer. I reassured her that he was in excellent hands with Dr. Julien Nordmann.  We will plan to get her back in 6 months.  I went ahead and wrote for a prescription of Celexa (10 mg by mouth daily) to try to help with her emotional lability.  Volanda Napoleon, MD 11/1/20184:59 PM

## 2017-05-16 LAB — D-DIMER, QUANTITATIVE (NOT AT ARMC): D-DIMER: 0.45 mg{FEU}/L (ref 0.00–0.49)

## 2017-05-20 ENCOUNTER — Other Ambulatory Visit: Payer: Self-pay | Admitting: *Deleted

## 2017-05-30 DIAGNOSIS — Z23 Encounter for immunization: Secondary | ICD-10-CM | POA: Diagnosis not present

## 2017-06-21 DIAGNOSIS — J029 Acute pharyngitis, unspecified: Secondary | ICD-10-CM | POA: Diagnosis not present

## 2017-08-06 ENCOUNTER — Other Ambulatory Visit: Payer: Self-pay | Admitting: *Deleted

## 2017-08-06 DIAGNOSIS — I82512 Chronic embolism and thrombosis of left femoral vein: Secondary | ICD-10-CM

## 2017-08-06 MED ORDER — CITALOPRAM HYDROBROMIDE 10 MG PO TABS: 10.0000 mg | ORAL_TABLET | Freq: Every day | ORAL | 1 refills | Status: DC

## 2017-08-13 DIAGNOSIS — Z803 Family history of malignant neoplasm of breast: Secondary | ICD-10-CM | POA: Diagnosis not present

## 2017-08-13 DIAGNOSIS — Z1231 Encounter for screening mammogram for malignant neoplasm of breast: Secondary | ICD-10-CM | POA: Diagnosis not present

## 2017-08-13 DIAGNOSIS — M810 Age-related osteoporosis without current pathological fracture: Secondary | ICD-10-CM | POA: Diagnosis not present

## 2017-11-13 ENCOUNTER — Inpatient Hospital Stay: Payer: Medicare Other

## 2017-11-13 ENCOUNTER — Inpatient Hospital Stay: Payer: Medicare Other | Attending: Hematology & Oncology | Admitting: Hematology & Oncology

## 2017-11-13 ENCOUNTER — Other Ambulatory Visit: Payer: Self-pay

## 2017-11-13 VITALS — BP 129/65 | HR 77 | Temp 98.0°F | Resp 18 | Wt 194.0 lb

## 2017-11-13 DIAGNOSIS — I82412 Acute embolism and thrombosis of left femoral vein: Secondary | ICD-10-CM

## 2017-11-13 DIAGNOSIS — D6851 Activated protein C resistance: Secondary | ICD-10-CM | POA: Insufficient documentation

## 2017-11-13 DIAGNOSIS — Z7901 Long term (current) use of anticoagulants: Secondary | ICD-10-CM | POA: Diagnosis not present

## 2017-11-13 DIAGNOSIS — I82512 Chronic embolism and thrombosis of left femoral vein: Secondary | ICD-10-CM

## 2017-11-13 LAB — CMP (CANCER CENTER ONLY)
ALBUMIN: 3.8 g/dL (ref 3.5–5.0)
ALK PHOS: 51 U/L (ref 26–84)
ALT: 27 U/L (ref 10–47)
AST: 32 U/L (ref 11–38)
Anion gap: 3 — ABNORMAL LOW (ref 5–15)
BUN: 16 mg/dL (ref 7–22)
CHLORIDE: 110 mmol/L — AB (ref 98–108)
CO2: 32 mmol/L (ref 18–33)
CREATININE: 0.7 mg/dL (ref 0.60–1.20)
Calcium: 10.7 mg/dL — ABNORMAL HIGH (ref 8.0–10.3)
GLUCOSE: 101 mg/dL (ref 73–118)
POTASSIUM: 5 mmol/L — AB (ref 3.3–4.7)
SODIUM: 145 mmol/L (ref 128–145)
Total Bilirubin: 1.5 mg/dL (ref 0.2–1.6)
Total Protein: 7 g/dL (ref 6.4–8.1)

## 2017-11-13 LAB — CBC WITH DIFFERENTIAL (CANCER CENTER ONLY)
BASOS PCT: 0 %
Basophils Absolute: 0 10*3/uL (ref 0.0–0.1)
EOS PCT: 3 %
Eosinophils Absolute: 0.2 10*3/uL (ref 0.0–0.5)
HCT: 39.7 % (ref 34.8–46.6)
HEMOGLOBIN: 13.4 g/dL (ref 11.6–15.9)
Lymphocytes Relative: 25 %
Lymphs Abs: 1.2 10*3/uL (ref 0.9–3.3)
MCH: 30.2 pg (ref 26.0–34.0)
MCHC: 33.8 g/dL (ref 32.0–36.0)
MCV: 89.6 fL (ref 81.0–101.0)
Monocytes Absolute: 0.3 10*3/uL (ref 0.1–0.9)
Monocytes Relative: 7 %
NEUTROS PCT: 65 %
Neutro Abs: 3.2 10*3/uL (ref 1.5–6.5)
PLATELETS: 170 10*3/uL (ref 145–400)
RBC: 4.43 MIL/uL (ref 3.70–5.32)
RDW: 13.1 % (ref 11.1–15.7)
WBC: 5 10*3/uL (ref 3.9–10.0)

## 2017-11-13 NOTE — Progress Notes (Signed)
Hematology and Oncology Follow Up Visit  Gloria Lambert [id number] Dec 09, 1945 72 y.o. 11/13/2017   Principle Diagnosis:   Thrombus of the left femoral vein  Heterozygous for factor V Leiden mutation  Current Therapy:    Xarelto 20 mg by mouth daily-finish 6 months in August 2017       Maintenance Xarelto 10 mg by mouth daily      Interim History:  Gloria Lambert is back for follow-up.  We saw her 6 months ago.  We last saw her, her husband was having some difficulties with respect to his lung cancer.  He has metastatic lung cancer.  It sounds like he is still having a tough time but yet he is still alive.  She is on maintenance Xarelto 10 mg a day.  She does not mind taking the 10 mg a day.  I think this is reasonable given the fact that she has the factor V Leiden mutation.  She is had no bleeding.  She has had no nausea or vomiting.  She is had no change in bowel or bladder habits.    There is been some occasional leg swelling.  This appears to be both legs.  Overall, her performance status is ECOG 1.  Medications:  Current Outpatient Medications:  .  calcium carbonate (TUMS - DOSED IN MG ELEMENTAL CALCIUM) 500 MG chewable tablet, Chew 1 tablet by mouth 3 times/day as needed-between meals & bedtime for indigestion or heartburn., Disp: , Rfl:  .  cholecalciferol (VITAMIN D) 1000 units tablet, Take 1,000 Units by mouth daily., Disp: , Rfl:  .  citalopram (CELEXA) 10 MG tablet, Take 1 tablet (10 mg total) by mouth daily., Disp: 90 tablet, Rfl: 1 .  Omega-3 Fatty Acids (FISH OIL) 1200 MG CAPS, Take 1 capsule by mouth daily., Disp: , Rfl:  .  Probiotic Product (PROBIOTIC-10 PO), Take 1 capsule by mouth daily., Disp: , Rfl:  .  ranitidine (ZANTAC) 300 MG tablet, Take 300 mg by mouth daily. , Disp: , Rfl:  .  XARELTO 10 MG TABS tablet, TAKE 1 TABLET BY MOUTH DAILY WITH SUPPER, Disp: 30 tablet, Rfl: 7  Allergies:  Allergies  Allergen Reactions  . Penicillins   . Latex Rash    rash  .  Penicillin G Rash    Past Medical History, Surgical history, Social history, and Family History were reviewed and updated.  Review of Systems: Review of Systems  Constitutional: Negative.   HENT: Negative.   Eyes: Negative.   Respiratory: Negative.   Cardiovascular: Negative.   Gastrointestinal: Negative.   Genitourinary: Negative.   Musculoskeletal: Negative.   Skin: Negative.   Neurological: Negative.   Endo/Heme/Allergies: Negative.   Psychiatric/Behavioral: Negative.      Physical Exam:  weight is 194 lb (88 kg). Her oral temperature is 98 F (36.7 C). Her blood pressure is 129/65 and her pulse is 77. Her respiration is 18 and oxygen saturation is 95%.   Wt Readings from Last 3 Encounters:  11/13/17 194 lb (88 kg)  05/15/17 195 lb (88.5 kg)  01/08/17 192 lb (87.1 kg)     Physical Exam  Constitutional: She is oriented to person, place, and time.  HENT:  Head: Normocephalic and atraumatic.  Mouth/Throat: Oropharynx is clear and moist.  Eyes: Pupils are equal, round, and reactive to light. EOM are normal.  Neck: Normal range of motion.  Cardiovascular: Normal rate, regular rhythm and normal heart sounds.  Pulmonary/Chest: Effort normal and breath sounds normal.  Abdominal: Soft.  Bowel sounds are normal.  Musculoskeletal: Normal range of motion. She exhibits no edema, tenderness or deformity.  Lymphadenopathy:    She has no cervical adenopathy.  Neurological: She is alert and oriented to person, place, and time.  Skin: Skin is warm and dry. No rash noted. No erythema.  Psychiatric: She has a normal mood and affect. Her behavior is normal. Judgment and thought content normal.  Vitals reviewed.      Lab Results  Component Value Date   WBC 5.0 11/13/2017   HGB 13.4 11/13/2017   HCT 39.7 11/13/2017   MCV 89.6 11/13/2017   PLT 170 11/13/2017     Chemistry      Component Value Date/Time   NA 145 11/13/2017 1414   NA 148 (H) 05/15/2017 1452   NA 143  07/10/2016 1119   K 5.0 (H) 11/13/2017 1414   K 4.4 05/15/2017 1452   K 4.4 07/10/2016 1119   CL 110 (H) 11/13/2017 1414   CL 106 05/15/2017 1452   CO2 32 11/13/2017 1414   CO2 26 05/15/2017 1452   CO2 25 07/10/2016 1119   BUN 16 11/13/2017 1414   BUN 16 05/15/2017 1452   BUN 14.7 07/10/2016 1119   CREATININE 0.70 11/13/2017 1414   CREATININE 0.7 05/15/2017 1452   CREATININE 0.8 07/10/2016 1119      Component Value Date/Time   CALCIUM 10.7 (H) 11/13/2017 1414   CALCIUM 10.3 05/15/2017 1452   CALCIUM 10.3 07/10/2016 1119   ALKPHOS 51 11/13/2017 1414   ALKPHOS 51 05/15/2017 1452   ALKPHOS 52 07/10/2016 1119   AST 32 11/13/2017 1414   AST 19 07/10/2016 1119   ALT 27 11/13/2017 1414   ALT 28 05/15/2017 1452   ALT 14 07/10/2016 1119   BILITOT 1.5 11/13/2017 1414   BILITOT 1.03 07/10/2016 1119         Impression and Plan: Gloria Lambert is a 72 year old white female. She is heterozygous for the factor V Leiden. She has a DVT in the left leg. I'm sure she will have a chronic thrombus.   From my perspective, everything is doing pretty well.  I will plan to get her back in another 6 months.  I do not think we have to do any Dopplers of her leg.  We will certainly pray for her husband and hopefully he will feel a little bit better.  Volanda Napoleon, MD 5/2/20193:19 PM

## 2017-12-05 DIAGNOSIS — Z808 Family history of malignant neoplasm of other organs or systems: Secondary | ICD-10-CM | POA: Diagnosis not present

## 2017-12-05 DIAGNOSIS — Z803 Family history of malignant neoplasm of breast: Secondary | ICD-10-CM | POA: Diagnosis not present

## 2017-12-05 DIAGNOSIS — Z1509 Genetic susceptibility to other malignant neoplasm: Secondary | ICD-10-CM | POA: Diagnosis not present

## 2017-12-05 DIAGNOSIS — Z1501 Genetic susceptibility to malignant neoplasm of breast: Secondary | ICD-10-CM | POA: Diagnosis not present

## 2018-01-01 DIAGNOSIS — H2513 Age-related nuclear cataract, bilateral: Secondary | ICD-10-CM | POA: Diagnosis not present

## 2018-01-06 ENCOUNTER — Other Ambulatory Visit: Payer: Self-pay | Admitting: Hematology & Oncology

## 2018-01-06 DIAGNOSIS — I82512 Chronic embolism and thrombosis of left femoral vein: Secondary | ICD-10-CM

## 2018-01-07 DIAGNOSIS — Z0000 Encounter for general adult medical examination without abnormal findings: Secondary | ICD-10-CM | POA: Diagnosis not present

## 2018-01-07 DIAGNOSIS — F4321 Adjustment disorder with depressed mood: Secondary | ICD-10-CM | POA: Diagnosis not present

## 2018-01-07 DIAGNOSIS — E213 Hyperparathyroidism, unspecified: Secondary | ICD-10-CM | POA: Diagnosis not present

## 2018-01-07 DIAGNOSIS — D6851 Activated protein C resistance: Secondary | ICD-10-CM | POA: Diagnosis not present

## 2018-01-07 DIAGNOSIS — R011 Cardiac murmur, unspecified: Secondary | ICD-10-CM | POA: Diagnosis not present

## 2018-01-07 DIAGNOSIS — M8580 Other specified disorders of bone density and structure, unspecified site: Secondary | ICD-10-CM | POA: Diagnosis not present

## 2018-01-19 ENCOUNTER — Telehealth: Payer: Self-pay

## 2018-01-19 NOTE — Telephone Encounter (Signed)
SENT REFERRAL TO SCHEDULING AND FILED NOTES 

## 2018-02-06 ENCOUNTER — Other Ambulatory Visit: Payer: Self-pay | Admitting: Internal Medicine

## 2018-02-06 DIAGNOSIS — E210 Primary hyperparathyroidism: Secondary | ICD-10-CM

## 2018-02-06 DIAGNOSIS — E559 Vitamin D deficiency, unspecified: Secondary | ICD-10-CM | POA: Diagnosis not present

## 2018-02-06 DIAGNOSIS — E042 Nontoxic multinodular goiter: Secondary | ICD-10-CM

## 2018-02-06 DIAGNOSIS — Z86718 Personal history of other venous thrombosis and embolism: Secondary | ICD-10-CM | POA: Diagnosis not present

## 2018-02-06 DIAGNOSIS — K219 Gastro-esophageal reflux disease without esophagitis: Secondary | ICD-10-CM | POA: Diagnosis not present

## 2018-02-06 DIAGNOSIS — M810 Age-related osteoporosis without current pathological fracture: Secondary | ICD-10-CM | POA: Diagnosis not present

## 2018-02-16 ENCOUNTER — Ambulatory Visit
Admission: RE | Admit: 2018-02-16 | Discharge: 2018-02-16 | Disposition: A | Discharge status: Home / Self Care | Payer: Medicare Other | Source: Ambulatory Visit | Attending: Internal Medicine | Admitting: Internal Medicine

## 2018-02-16 DIAGNOSIS — E210 Primary hyperparathyroidism: Secondary | ICD-10-CM

## 2018-02-16 DIAGNOSIS — E042 Nontoxic multinodular goiter: Secondary | ICD-10-CM

## 2018-02-16 DIAGNOSIS — E041 Nontoxic single thyroid nodule: Secondary | ICD-10-CM | POA: Diagnosis not present

## 2018-02-18 DIAGNOSIS — M810 Age-related osteoporosis without current pathological fracture: Secondary | ICD-10-CM | POA: Diagnosis not present

## 2018-02-18 DIAGNOSIS — E210 Primary hyperparathyroidism: Secondary | ICD-10-CM | POA: Diagnosis not present

## 2018-02-18 DIAGNOSIS — Z86718 Personal history of other venous thrombosis and embolism: Secondary | ICD-10-CM | POA: Diagnosis not present

## 2018-02-18 DIAGNOSIS — E559 Vitamin D deficiency, unspecified: Secondary | ICD-10-CM | POA: Diagnosis not present

## 2018-02-18 DIAGNOSIS — E042 Nontoxic multinodular goiter: Secondary | ICD-10-CM | POA: Diagnosis not present

## 2018-02-18 DIAGNOSIS — K219 Gastro-esophageal reflux disease without esophagitis: Secondary | ICD-10-CM | POA: Diagnosis not present

## 2018-03-10 ENCOUNTER — Encounter: Payer: Self-pay | Admitting: Physician Assistant

## 2018-03-12 ENCOUNTER — Ambulatory Visit (HOSPITAL_COMMUNITY): Payer: Medicare Other

## 2018-03-15 DIAGNOSIS — R011 Cardiac murmur, unspecified: Secondary | ICD-10-CM

## 2018-03-15 HISTORY — DX: Cardiac murmur, unspecified: R01.1

## 2018-03-26 ENCOUNTER — Telehealth: Payer: Self-pay | Admitting: Physician Assistant

## 2018-03-26 NOTE — Telephone Encounter (Signed)
Spoke to patient who was inquiring about having an Echo prior to her OV with [facility] 9/17. I told her that as a new patient, Nicki Reaper would evaluate her at the visit and determine what tests, if any, she needed.  She verbalized understanding and thanked [place] for the call.

## 2018-03-26 NOTE — Telephone Encounter (Signed)
New message   Patient is asking if she should have an echo before her appt 03/31/2018 with Richardson Dopp? Please call to discuss.

## 2018-03-31 ENCOUNTER — Encounter: Payer: Self-pay | Admitting: Physician Assistant

## 2018-03-31 ENCOUNTER — Encounter (INDEPENDENT_AMBULATORY_CARE_PROVIDER_SITE_OTHER): Payer: Self-pay

## 2018-03-31 ENCOUNTER — Ambulatory Visit (INDEPENDENT_AMBULATORY_CARE_PROVIDER_SITE_OTHER): Payer: Medicare Other | Admitting: Physician Assistant

## 2018-03-31 VITALS — BP 128/70 | HR 69 | Ht 66.0 in | Wt 199.8 lb

## 2018-03-31 DIAGNOSIS — D6851 Activated protein C resistance: Secondary | ICD-10-CM

## 2018-03-31 DIAGNOSIS — E213 Hyperparathyroidism, unspecified: Secondary | ICD-10-CM | POA: Diagnosis not present

## 2018-03-31 DIAGNOSIS — R011 Cardiac murmur, unspecified: Secondary | ICD-10-CM | POA: Diagnosis not present

## 2018-03-31 NOTE — Patient Instructions (Signed)
Medication Instructions:  Your physician recommends that you continue on your current medications as directed. Please refer to the Current Medication list given to you today.  Testing/Procedures: Your physician has requested that you have an echocardiogram. Echocardiography is a painless test that uses sound waves to create images of your heart. It provides your doctor with information about the size and shape of your heart and how well your heart's chambers and valves are working. This procedure takes approximately one hour. There are no restrictions for this procedure.  Follow-Up: Your physician recommends that you schedule a follow-up appointment in: As needed with Dr. Burt Knack of [facility]   If you need a refill on your cardiac medications before your next appointment, please call your pharmacy.

## 2018-03-31 NOTE — Progress Notes (Signed)
Cardiology Office Note:    Date:  03/31/2018   ID:  Gloria Lambert, DOB [birth year], MRN [id number]  PCP:  Nickola Major, MD  Cardiologist:   New  Endocrinologist:  Minette Brine, MD Hematologist:  Burney Gauze, MD  Referring MD: Nickola Major, MD   Chief Complaint  Patient presents with  . Heart Murmur    History of Present Illness:    Gloria Lambert is a 72 y.o. female with prior DVT, Factor V Leiden deficiency, chronic anticoagulation with Rivaroxaban, parathyroid adenoma with hyperparathyroidism, GERD, depression who is being seen today for the evaluation of a heart murmur at the request of Nickola Major, MD.   Gloria Lambert is here alone today. She does not have any cardiac hx.  She had an echocardiogram in [place] in 2007.  I cannot pull up the report.  She does not recall why she had it done.  She does have a long hx of feeling skipped beats when she checks her pulse.  She had palpitations years ago and wore a Holter.  She was told it was normal.  She has not had any palpitations in years.  She has not had syncope, near syncope.  She has not had chest pain, shortness of breath, paroxysmal nocturnal dyspnea.  She has chronic leg swelling (DVT on the L and Baker's cyst on the R).     Prior CV studies:   The following studies were reviewed today:  None   Past Medical History:  Diagnosis Date  . 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 Heterozygous factor V Leiden mutation ([place]) 11/06/2015  . HLD (hyperlipidemia)   . Hyperparathyroidism ([place])    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 - surgery not recommended    Past Surgical History:  Procedure Laterality Date  . arthroscopic knee surgery Right   . BILATERAL OOPHORECTOMY  1998  . BLADDER SUSPENSION  1998  . HIP ARTHROPLASTY Right    total hip replacement  . TONSILLECTOMY    . VAGINAL HYSTERECTOMY  1998    Current Medications: Current Meds  Medication Sig  . ALPRAZolam  (XANAX) 0.25 MG tablet Take 0.25 mg by mouth as needed for anxiety.  . calcium carbonate (TUMS - DOSED IN MG ELEMENTAL CALCIUM) 500 MG chewable tablet Chew 1 tablet by mouth 3 times/day as needed-between meals & bedtime for indigestion or heartburn.  . cholecalciferol (VITAMIN D) 1000 units tablet Take 1,000 Units by mouth daily.  . citalopram (CELEXA) 10 MG tablet TAKE 1 TABLET BY MOUTH EVERY DAY  . Omega-3 Fatty Acids (FISH OIL) 1200 MG CAPS Take 1 capsule by mouth daily.  . Probiotic Product (PROBIOTIC-10 PO) Take 1 capsule by mouth daily.  . ranitidine (ZANTAC) 300 MG tablet Take 300 mg by mouth daily.   . Vitamin D, Ergocalciferol, (DRISDOL) 50000 units CAPS capsule Take 50,000 Units by mouth once a week.  Alveda Reasons 10 MG TABS tablet TAKE 1 TABLET BY MOUTH DAILY WITH SUPPER     Allergies:   Penicillins; Latex; and Penicillin g   Social History   Socioeconomic History  . Marital status: Widowed    Spouse name: Not on file  . Number of children: 2  . Years of education: Not on file  . Highest education level: Not on file  Occupational History  . Occupation: retired    Comment: previous Transport planner  . Financial resource strain: Not on file  . Food insecurity:  Worry: Not on file    Inability: Not on file  . Transportation needs:    Medical: Not on file    Non-medical: Not on file  Tobacco Use  . Smoking status: Never Smoker  . Smokeless tobacco: Never Used  Substance and Sexual Activity  . Alcohol use: Not Currently    Alcohol/week: 0.0 standard drinks    Comment: socaily  . Drug use: No  . Sexual activity: Not on file  Lifestyle  . Physical activity:    Days per week: Not on file    Minutes per session: Not on file  . Stress: Not on file  Relationships  . Social connections:    Talks on phone: Not on file    Gets together: Not on file    Attends religious service: Not on file    Active member of club or organization: Not on file    Attends meetings  of clubs or organizations: Not on file    Relationship status: Not on file  Other Topics Concern  . Not on file  Social History Narrative   Moved from [place] in Oct 24, 2014   Husband died December 23, 2017 (cancer)   Retired     Family Hx: The patient's family history includes Breast cancer (age of onset: 52) in her mother; Cancer in her father; Heart attack in her paternal grandmother; Heart murmur in her sister.  ROS:   Please see the history of present illness.    Review of Systems  Cardiovascular: Positive for irregular heartbeat.  Hematologic/Lymphatic: Bruises/bleeds easily.  Psychiatric/Behavioral: The patient is nervous/anxious.    All other systems reviewed and are negative.   EKGs/Labs/Other Test Reviewed:    EKG:  EKG is  ordered today.  The ekg ordered today demonstrates normal sinus rhythm, heart rate 69, normal axis, QTC 405, no old tracing to compare  Recent Labs: 11/13/2017: ALT 27; BUN 16; Creatinine 0.70; Hemoglobin 13.4; Platelet Count 170; Potassium 5.0; Sodium 145   Recent Lipid Panel No results found for: CHOL, TRIG, HDL, CHOLHDL, LDLCALC, LDLDIRECT  Physical Exam:    VS:  BP 128/70   Pulse 69   Ht 5\' 6"  (1.676 m)   Wt 199 lb 12.8 oz (90.6 kg)   SpO2 94%   BMI 32.25 kg/m     Wt Readings from Last 3 Encounters:  03/31/18 199 lb 12.8 oz (90.6 kg)  11/13/17 194 lb (88 kg)  05/15/17 195 lb (88.5 kg)     Physical Exam  Constitutional: She is oriented to person, place, and time. She appears well-developed and well-nourished. She appears distressed.  HENT:  Head: Normocephalic and atraumatic.  Eyes: No scleral icterus.  Neck: No JVD present. No thyromegaly present.  Cardiovascular: Normal rate and regular rhythm.  Murmur heard.  Low-pitched early systolic murmur is present with a grade of 1/6 at the upper right sternal border. Pulmonary/Chest: Effort normal. She has no wheezes. She has no rales.  Abdominal: Soft. She exhibits no distension.  Musculoskeletal:  She exhibits edema (compression stockings in place).  Lymphadenopathy:    She has no cervical adenopathy.  Neurological: She is alert and oriented to person, place, and time.  Skin: Skin is warm and dry.  Psychiatric: She has a normal mood and affect.    ASSESSMENT & PLAN:    Murmur, cardiac  Her exam suggests a benign murmur. 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  She has noted skipped beats by checking her pulse.  She has not had palpitations.  I have recommended she have an echocardiogram to assess her murmur.  At this point, I do not think she needs a heart monitor.  She can contact us if she has palpitations.  Her husband used to see Dr. Burt Knack (her husband passed away earlier this year).  If she needs follow up with Cardiology, she would like to see him.    -Arrange echocardiogram   Heterozygous factor V Leiden mutation ([place])  She remains on low dose Rivaroxaban and is followed by hematology.    Hyperparathyroidism [facility])  She is followed by Endocrinology.     Dispo:  Return as needed depending on test results with Dr. Burt Knack or Richardson Dopp, PA-C .   Medication Adjustments/Labs and Tests Ordered: Current medicines are reviewed at length with the patient today.  Concerns regarding medicines are outlined above.  Orders/Tests:  Orders Placed This Encounter  Procedures  . EKG 12-Lead  . ECHOCARDIOGRAM COMPLETE   Medication changes: No orders of the defined types were placed in this encounter.  Signed, Richardson Dopp, PA-C  03/31/2018 11:23 AM    [facility] [place], [place], [place]  [address] Phone: [phone]; Fax: [phone]

## 2018-04-07 ENCOUNTER — Other Ambulatory Visit: Payer: Self-pay | Admitting: Hematology & Oncology

## 2018-04-07 DIAGNOSIS — I82512 Chronic embolism and thrombosis of left femoral vein: Secondary | ICD-10-CM

## 2018-04-10 ENCOUNTER — Other Ambulatory Visit: Payer: Self-pay

## 2018-04-10 ENCOUNTER — Ambulatory Visit (HOSPITAL_COMMUNITY): Payer: Medicare Other | Attending: Physician Assistant

## 2018-04-10 DIAGNOSIS — Z86718 Personal history of other venous thrombosis and embolism: Secondary | ICD-10-CM | POA: Diagnosis not present

## 2018-04-10 DIAGNOSIS — I340 Nonrheumatic mitral (valve) insufficiency: Secondary | ICD-10-CM | POA: Diagnosis not present

## 2018-04-10 DIAGNOSIS — R011 Cardiac murmur, unspecified: Secondary | ICD-10-CM | POA: Insufficient documentation

## 2018-04-10 DIAGNOSIS — Z7901 Long term (current) use of anticoagulants: Secondary | ICD-10-CM | POA: Insufficient documentation

## 2018-04-10 DIAGNOSIS — E785 Hyperlipidemia, unspecified: Secondary | ICD-10-CM | POA: Diagnosis not present

## 2018-04-10 DIAGNOSIS — D6851 Activated protein C resistance: Secondary | ICD-10-CM | POA: Insufficient documentation

## 2018-04-10 DIAGNOSIS — I509 Heart failure, unspecified: Secondary | ICD-10-CM | POA: Insufficient documentation

## 2018-04-12 ENCOUNTER — Encounter: Payer: Self-pay | Admitting: Physician Assistant

## 2018-04-13 ENCOUNTER — Telehealth: Payer: Self-pay

## 2018-04-13 DIAGNOSIS — R931 Abnormal findings on diagnostic imaging of heart and coronary circulation: Secondary | ICD-10-CM

## 2018-04-13 DIAGNOSIS — I517 Cardiomegaly: Secondary | ICD-10-CM

## 2018-04-13 MED ORDER — METOPROLOL TARTRATE 50 MG PO TABS: ORAL_TABLET | ORAL | 0 refills | Status: DC

## 2018-04-13 NOTE — Telephone Encounter (Signed)
-----   Message from Liliane Shi, [place] sent at 04/12/2018 12:17 PM EDT ----- This study demonstrates:  Somewhat weakened heart muscle function (ejection fraction is 45-50%).  There is mildly impaired relaxation (diastolic dysfunction).  There is no significant valve disease.   Medication changes / Follow up studies / Other recommendations:    - Please arrange a Coronary CTA with FFR to better evaluate the low EF (use Dx: Cardiomyopathy)   - Arrange a follow up appointment with Dr. Burt Knack or me on a day Dr. Burt Knack is in the office after the CTA is done to follow up on the results  Please send results to the PCP:  Nickola Major, MD  Richardson Dopp, PA-C 04/12/2018 12:13 PM

## 2018-04-13 NOTE — Telephone Encounter (Signed)
Spoke with pt regarding Echo results and [employer] recommendation. Pt was educated on what a CTA will measure and look at. She was also given some pre procedural instructions and understands she will be sent her instruction letter via [place]. Pt agrees to see Dr Burt Knack after her CTA has been read.   Pt states she will call back with any additional questions.

## 2018-04-27 NOTE — Telephone Encounter (Signed)
Please check to make sure the coronary CTA is being arranged.  Also note, she needs follow up with Dr. Burt Knack or me (on a day Dr. Burt Knack is here) after the CTA is done. Richardson Dopp, PA-C    04/27/2018 10:30 AM

## 2018-04-27 NOTE — Telephone Encounter (Signed)
I have sent a message to Mack Guise to check on Coronary CT w/FFR and appt with Richardson Dopp, PA after CT has been completed.

## 2018-04-28 NOTE — Telephone Encounter (Signed)
Gloria Lambert sent to Michae Kava, [place]        Patient was not approved unitl 10-11.  Lvmom to call and schedule.

## 2018-05-01 ENCOUNTER — Encounter: Payer: Self-pay | Admitting: Physician Assistant

## 2018-05-01 ENCOUNTER — Ambulatory Visit (HOSPITAL_COMMUNITY)
Admission: RE | Admit: 2018-05-01 | Discharge: 2018-05-01 | Disposition: A | Discharge status: Home / Self Care | Payer: Medicare Other | Source: Ambulatory Visit | Attending: Physician Assistant | Admitting: Physician Assistant

## 2018-05-01 ENCOUNTER — Telehealth: Payer: Self-pay | Admitting: *Deleted

## 2018-05-01 ENCOUNTER — Ambulatory Visit (HOSPITAL_COMMUNITY): Payer: Medicare Other

## 2018-05-01 ENCOUNTER — Telehealth: Payer: Self-pay | Admitting: Physician Assistant

## 2018-05-01 DIAGNOSIS — K449 Diaphragmatic hernia without obstruction or gangrene: Secondary | ICD-10-CM | POA: Insufficient documentation

## 2018-05-01 DIAGNOSIS — E785 Hyperlipidemia, unspecified: Secondary | ICD-10-CM

## 2018-05-01 DIAGNOSIS — R079 Chest pain, unspecified: Secondary | ICD-10-CM | POA: Diagnosis not present

## 2018-05-01 DIAGNOSIS — R931 Abnormal findings on diagnostic imaging of heart and coronary circulation: Secondary | ICD-10-CM | POA: Diagnosis not present

## 2018-05-01 DIAGNOSIS — I517 Cardiomegaly: Secondary | ICD-10-CM | POA: Insufficient documentation

## 2018-05-01 DIAGNOSIS — D6851 Activated protein C resistance: Secondary | ICD-10-CM

## 2018-05-01 DIAGNOSIS — Z8719 Personal history of other diseases of the digestive system: Secondary | ICD-10-CM

## 2018-05-01 DIAGNOSIS — I2510 Atherosclerotic heart disease of native coronary artery without angina pectoris: Secondary | ICD-10-CM | POA: Insufficient documentation

## 2018-05-01 HISTORY — DX: Personal history of other diseases of the digestive system: Z87.19

## 2018-05-01 LAB — POCT I-STAT CREATININE: Creatinine, Ser: 0.7 mg/dL (ref 0.44–1.00)

## 2018-05-01 MED ORDER — IOPAMIDOL (ISOVUE-370) INJECTION 76%
100.0000 mL | Freq: Once | INTRAVENOUS | Status: AC | PRN
  Administered 2018-05-01: 100 mL via INTRAVENOUS

## 2018-05-01 MED ORDER — NITROGLYCERIN 0.4 MG SL SUBL
SUBLINGUAL_TABLET | SUBLINGUAL | Status: AC
  Filled 2018-05-01: qty 2

## 2018-05-01 MED ORDER — IOPAMIDOL (ISOVUE-370) INJECTION 76%
INTRAVENOUS | Status: AC
  Filled 2018-05-01: qty 100

## 2018-05-01 MED ORDER — NITROGLYCERIN 0.4 MG SL SUBL
0.8000 mg | SUBLINGUAL_TABLET | Freq: Once | SUBLINGUAL | Status: AC
  Administered 2018-05-01: 0.8 mg via SUBLINGUAL
  Filled 2018-05-01: qty 25

## 2018-05-01 NOTE — Telephone Encounter (Signed)
Pt has been notified of CT results and recommendations by phone. Pt is agreeable to plan of care and will have fasting lipids 05/05/18; f/u w/Scott W. PA 05/19/18 @ 11:15 to discuss echo and CT in more detail. Pt thanked me for the call. Pt did ask if we could get records from Dr. Minette Brine office as well as she states he has been treating her thyroid problems and said she has been having a problem with her calcium levels. I will have HIM Dept call over to [facility] Dr. Noralee Chars office on Monday to request records. Pt thanked me for the call

## 2018-05-01 NOTE — Telephone Encounter (Signed)
New message:  Pt is returning a call for lab results. 

## 2018-05-01 NOTE — Telephone Encounter (Signed)
Pt has been notified of lab results by phone with verbal understanding. Pt thanked me for the call.   

## 2018-05-01 NOTE — Telephone Encounter (Signed)
DPR ok to lmom. Left message lab work looks good, continue on current Tx plan. If any questions call [phone].

## 2018-05-01 NOTE — Telephone Encounter (Signed)
-----   Message from Liliane Shi, [place] sent at 05/01/2018  9:46 AM EDT ----- Kidney function (Creatinine) normal. Recommendations:  - Continue current medications and follow up as planned. Richardson Dopp, PA-C    05/01/2018 9:43 AM

## 2018-05-01 NOTE — Telephone Encounter (Signed)
-----   Message from Liliane Shi, [place] sent at 05/01/2018  5:12 PM EDT ----- The coronary CT angiogram demonstrates some plaque but no obstruction ("blockage").  The calcium score is somewhat elevated at 12 which means we need to aggressively manage risk factors.  The non cardiac findings are normal aside from a small to moderate sized hiatal hernia. Recommendations:  - Arrange fasting Lipids (no LFTs needed - done in 11/2017)  - Arrange follow up office visit with me or Dr. Burt Knack to review findings on Echo and CT in detail. Richardson Dopp, PA-C    05/01/2018 5:02 PM

## 2018-05-05 ENCOUNTER — Other Ambulatory Visit: Payer: Medicare Other

## 2018-05-05 DIAGNOSIS — E785 Hyperlipidemia, unspecified: Secondary | ICD-10-CM

## 2018-05-05 LAB — LIPID PANEL
CHOL/HDL RATIO: 4.7 ratio — AB (ref 0.0–4.4)
CHOLESTEROL TOTAL: 200 mg/dL — AB (ref 100–199)
HDL: 43 mg/dL (ref >39)
LDL Calculated: 128 mg/dL — ABNORMAL HIGH (ref 0–99)
TRIGLYCERIDES: 144 mg/dL (ref 0–149)
VLDL Cholesterol Cal: 29 mg/dL (ref 5–40)

## 2018-05-06 ENCOUNTER — Telehealth: Payer: Self-pay | Admitting: *Deleted

## 2018-05-06 DIAGNOSIS — E785 Hyperlipidemia, unspecified: Secondary | ICD-10-CM

## 2018-05-06 MED ORDER — ROSUVASTATIN CALCIUM 10 MG PO TABS: 10.0000 mg | ORAL_TABLET | Freq: Every day | ORAL | 3 refills | Status: DC

## 2018-05-06 NOTE — Telephone Encounter (Signed)
Pt has been notified of lab results by phone with verbal understanding. Pt agreeable to recommendations to start Crestor 10 mg daily, with fasting lipid and liver to be done 06/16/18. Pt has appt with Richardson Dopp, PA 05/19/18. Pt thanked me for the call.

## 2018-05-06 NOTE — Telephone Encounter (Signed)
-----   Message from Liliane Shi, [place] sent at 05/05/2018  5:16 PM EDT ----- Cholesterol above goal.  I would like to see her LDL (bad cholesterol) at least less than 100. Recommendations:  -Start Rosuvastatin 10 mg daily  -Arrange follow-up fasting lipids and LFTs in 6-8 weeks Richardson Dopp, PA-C    05/05/2018 5:14 PM

## 2018-05-06 NOTE — Telephone Encounter (Signed)
Tried to call the pt to go over lab results, though line was busy. I will try again later.

## 2018-05-19 ENCOUNTER — Encounter: Payer: Self-pay | Admitting: Physician Assistant

## 2018-05-19 ENCOUNTER — Ambulatory Visit (INDEPENDENT_AMBULATORY_CARE_PROVIDER_SITE_OTHER): Payer: Medicare Other | Admitting: Physician Assistant

## 2018-05-19 VITALS — BP 132/84 | HR 72 | Ht 66.0 in | Wt 195.1 lb

## 2018-05-19 DIAGNOSIS — I420 Dilated cardiomyopathy: Secondary | ICD-10-CM

## 2018-05-19 DIAGNOSIS — I428 Other cardiomyopathies: Secondary | ICD-10-CM | POA: Insufficient documentation

## 2018-05-19 DIAGNOSIS — E7800 Pure hypercholesterolemia, unspecified: Secondary | ICD-10-CM | POA: Insufficient documentation

## 2018-05-19 DIAGNOSIS — I2510 Atherosclerotic heart disease of native coronary artery without angina pectoris: Secondary | ICD-10-CM | POA: Diagnosis not present

## 2018-05-19 DIAGNOSIS — E785 Hyperlipidemia, unspecified: Secondary | ICD-10-CM | POA: Diagnosis not present

## 2018-05-19 HISTORY — DX: Dilated cardiomyopathy: I42.0

## 2018-05-19 MED ORDER — METOPROLOL SUCCINATE ER 25 MG PO TB24: 25.0000 mg | ORAL_TABLET | Freq: Every day | ORAL | 3 refills | Status: DC

## 2018-05-19 NOTE — Progress Notes (Signed)
Cardiology Office Note:    Date:  05/19/2018    ID:  Gloria Lambert, DOB Apr 21, 1946, MRN [id number]  PCP:  Nickola Major, MD  Cardiologist:  Sherren Mocha, MD   Electrophysiologist:  None  Endocrinologist:  Minette Brine, MD Hematologist:  Burney Gauze, MD  Referring MD: Nickola Major, MD   Chief Complaint  Patient presents with  . Follow-up    Cardiomyopathy     History of Present Illness:    Gloria Lambert is a 72 y.o. female with coronary calcification on CT, mild cardiomyopathy, prior DVT, Factor V Leiden deficiency, chronic anticoagulation with Rivaroxaban, parathyroid adenoma with hyperparathyroidism, GERD, depression.  She was evaluated in 03/2018 for a heart murmur.  An echocardiogram demonstrated no significant valve disease but her EF was 45-50.  A coronary CTA was arranged and demonstrated LAD plaque but no significant stenosis and an elevated calcium score.  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    Gloria Lambert returns for follow up.  She is here alone.  Since last seen, she did go to [employer].  She did have to stop a lot due to elevated HRs and shortness of breath.  It was quite hot while she was there.  She otherwise denies shortness of breath, chest pain.  She has not had dizziness or syncope.  She has chronic leg swelling in her R leg due to a Baker's cyst.  She sleeps on an wedge due to her hiatal hernia.    Prior CV studies:   The following studies were reviewed today:  Coronary CTA 05/01/18 FINDINGS: Non-cardiac: See separate report from [facility]. The pulmonary veins drained normally to the left atrium. Calcium Score: 12 Agatston units. Coronary Arteries: Right dominant with no anomalies LM: No plaque or stenosis. LAD system: Calcified plaque in the mid LAD, no significant stenosis. Circumflex system: No plaque or stenosis. RCA system: No plaque or stenosis. IMPRESSION: 1. Coronary artery calcium score 12 Agatston  units, this places the patient in the 49th percentile for age and gender, suggesting intermediate risk for future cardiac events. 2.  No obstructive coronary artery disease noted.  Echo 04/10/18 Mod LVH, EF 45-50, ant-lat HK, Gr 1 DD, trivial MR   Past Medical History:  Diagnosis Date  . 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 Coronary artery calcification seen on CT scan    Coronary CTA 10/19:  Ca score 12 (49th percentile - intermediate risk); mid LAD calcified plaque; no obstructive CAD; Small to mod hiatal hernia  . Dilated cardiomyopathy ([place]) 05/19/2018   Echo 04/10/18 - Mod LVH, EF 45-50, ant-lat HK, Gr 1 DD, trivial MR  . Heterozygous factor V Leiden mutation ([place]) 11/06/2015  . History of echocardiogram    Echo 9/19: mod LVH, EF 45-50, ant-lat HK, Gr 1 DD, trivial MR  . HLD (hyperlipidemia)   . Hyperparathyroidism ([place])    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 - surgery not recommended   Surgical Hx: The patient  has a past surgical history that includes Hip Arthroplasty (Right); Vaginal hysterectomy (1998); Bilateral oophorectomy (1998); Bladder suspension (1998); arthroscopic knee surgery (Right); and Tonsillectomy.   Current Medications: Current Meds  Medication Sig  . ALPRAZolam (XANAX) 0.25 MG tablet Take 0.25 mg by mouth as needed for anxiety.  . calcium carbonate (TUMS - DOSED IN MG ELEMENTAL CALCIUM) 500 MG chewable tablet Chew 1 tablet by mouth 3 times/day as needed-between meals & bedtime for indigestion or  heartburn.  . cholecalciferol (VITAMIN D) 1000 units tablet Take 1,000 Units by mouth daily.  . citalopram (CELEXA) 10 MG tablet TAKE 1 TABLET BY MOUTH EVERY DAY  . metoprolol tartrate (LOPRESSOR) 50 MG tablet Take Metoprolol, 50mg , one tablet, one hour prior to your coronary CT exam.  . Omega-3 Fatty Acids (FISH OIL) 1200 MG CAPS Take 1 capsule by mouth daily.  . Probiotic Product (PROBIOTIC-10 PO) Take 1 capsule by mouth  daily.  . ranitidine (ZANTAC) 300 MG tablet Take 300 mg by mouth daily.   . rosuvastatin (CRESTOR) 10 MG tablet Take 1 tablet (10 mg total) by mouth daily.  . Vitamin D, Ergocalciferol, (DRISDOL) 50000 units CAPS capsule Take 50,000 Units by mouth once a week.  Alveda Reasons 10 MG TABS tablet TAKE 1 TABLET BY MOUTH DAILY WITH SUPPER     Allergies:   Penicillins; Latex; and Penicillin g   Social History   Tobacco Use  . Smoking status: Never Smoker  . Smokeless tobacco: Never Used  Substance Use Topics  . Alcohol use: Not Currently    Alcohol/week: 0.0 standard drinks    Comment: socaily  . Drug use: No     Family Hx: The patient's family history includes Breast cancer (age of onset: 19) in her mother; Cancer in her father; Heart attack in her paternal grandmother; Heart murmur in her sister.  ROS:   Please see the history of present illness.    ROS All other systems reviewed and are negative.   EKGs/Labs/Other Test Reviewed:    EKG:  EKG is not ordered today.    Recent Labs: 11/13/2017: ALT 27; BUN 16; Hemoglobin 13.4; Platelet Count 170; Potassium 5.0; Sodium 145 05/01/2018: Creatinine, Ser 0.70   Recent Lipid Panel Lab Results  Component Value Date/Time   CHOL 200 (H) 05/05/2018 11:23 AM   TRIG 144 05/05/2018 11:23 AM   HDL 43 05/05/2018 11:23 AM   CHOLHDL 4.7 (H) 05/05/2018 11:23 AM   LDLCALC 128 (H) 05/05/2018 11:23 AM    Physical Exam:    VS:  BP 132/84   Pulse 72   Ht 5\' 6"  (1.676 m)   Wt 195 lb 1.9 oz (88.5 kg)   BMI 31.49 kg/m     Wt Readings from Last 3 Encounters:  05/19/18 195 lb 1.9 oz (88.5 kg)  03/31/18 199 lb 12.8 oz (90.6 kg)  11/13/17 194 lb (88 kg)     Physical Exam  Constitutional: She is oriented to person, place, and time. She appears well-developed and well-nourished. No distress.  HENT:  Head: Normocephalic and atraumatic.  Eyes: No scleral icterus.  Neck: No JVD present. No thyromegaly present.  Cardiovascular: Normal rate and  regular rhythm.  No murmur heard. Pulmonary/Chest: Effort normal. She has no rales.  Abdominal: Soft.  Musculoskeletal: She exhibits no edema.  Lymphadenopathy:    She has no cervical adenopathy.  Neurological: She is alert and oriented to person, place, and time.  Skin: Skin is warm and dry.  Psychiatric: She has a normal mood and affect.    ASSESSMENT & PLAN:    Dilated cardiomyopathy ([place]) Non-ischemic.  Coronary CTA did not demonstrate any significant stenosis.  Reduced ejection fraction is mild.  She is NYHA 2.  Volume status is normal.  We discussed the physiology and management of her cardiomyopathy.  I have recommended adding a beta-blocker initially and then an ACE inhibitor.    - Start Metoprolol succinate 25 mg QD  - Follow up in 4  weeks; consider Lisinopril 2.5 mg QD at that time  Coronary artery calcification seen on CT scan No significant obstruction on recent CTA.  She is not on ASA as she is on Xarelto.  She denies angina.    Hyperlipidemia, unspecified hyperlipidemia type   Continue statin.  She has follow up labs in Dec 2019.   Dispo:  Return in about 4 weeks (around 06/16/2018) for Routine Follow Up, w/ Richardson Dopp, PA-C.   Medication Adjustments/Labs and Tests Ordered: Current medicines are reviewed at length with the patient today.  Concerns regarding medicines are outlined above.  Tests Ordered: No orders of the defined types were placed in this encounter.  Medication Changes: Meds ordered this encounter  Medications  . metoprolol succinate (TOPROL XL) 25 MG 24 hr tablet    Sig: Take 1 tablet (25 mg total) by mouth daily.    Dispense:  90 tablet    Refill:  3    Signed, Richardson Dopp, PA-C  05/19/2018 12:08 PM    [address], [place], [place]  [address] Phone: [phone]; Fax: [phone]

## 2018-05-19 NOTE — Patient Instructions (Signed)
Medication Instructions:  Your physician has recommended you make the following change in your medication:   1. START TOPROL XL 25 MG DAILY.  If you need a refill on your cardiac medications before your next appointment, please call your pharmacy.   Lab work: NONE If you have labs (blood work) drawn today and your tests are completely normal, you will receive your results only by: Marland Kitchen MyChart Message (if you have MyChart) OR . A paper copy in the mail If you have any lab test that is abnormal or we need to change your treatment, we will call you to review the results.  Testing/Procedures: NONE  Follow-Up: At [facility], you and your health needs are our priority.  As part of our continuing mission to provide you with exceptional heart care, we have created designated Provider Care Teams.  These Care Teams include your primary Cardiologist (physician) and Advanced Practice Providers (APPs -  Physician Assistants and Nurse Practitioners) who all work together to provide you with the care you need, when you need it. You will need a follow up appointment in:  3-4 months. You may see Sherren Mocha, MD AND the following Advanced Practice Providers on your designated Care Team: Richardson Dopp, PA-C IN 4-6 WEEKS.   Any Other Special Instructions Will Be Listed Below (If Applicable).

## 2018-05-20 ENCOUNTER — Inpatient Hospital Stay: Payer: Medicare Other | Attending: Hematology & Oncology | Admitting: Hematology & Oncology

## 2018-05-20 ENCOUNTER — Other Ambulatory Visit: Payer: Self-pay

## 2018-05-20 ENCOUNTER — Ambulatory Visit: Payer: Self-pay | Admitting: Surgery

## 2018-05-20 ENCOUNTER — Inpatient Hospital Stay: Payer: Medicare Other

## 2018-05-20 ENCOUNTER — Encounter: Payer: Self-pay | Admitting: Hematology & Oncology

## 2018-05-20 VITALS — BP 128/59 | HR 86 | Temp 97.8°F | Resp 16 | Wt 196.8 lb

## 2018-05-20 DIAGNOSIS — M8580 Other specified disorders of bone density and structure, unspecified site: Secondary | ICD-10-CM

## 2018-05-20 DIAGNOSIS — D6851 Activated protein C resistance: Secondary | ICD-10-CM | POA: Diagnosis not present

## 2018-05-20 DIAGNOSIS — I82412 Acute embolism and thrombosis of left femoral vein: Secondary | ICD-10-CM | POA: Insufficient documentation

## 2018-05-20 DIAGNOSIS — Z7901 Long term (current) use of anticoagulants: Secondary | ICD-10-CM

## 2018-05-20 DIAGNOSIS — I82512 Chronic embolism and thrombosis of left femoral vein: Secondary | ICD-10-CM

## 2018-05-20 DIAGNOSIS — E213 Hyperparathyroidism, unspecified: Secondary | ICD-10-CM

## 2018-05-20 DIAGNOSIS — Z79899 Other long term (current) drug therapy: Secondary | ICD-10-CM

## 2018-05-20 DIAGNOSIS — E210 Primary hyperparathyroidism: Secondary | ICD-10-CM | POA: Diagnosis not present

## 2018-05-20 DIAGNOSIS — E042 Nontoxic multinodular goiter: Secondary | ICD-10-CM | POA: Diagnosis not present

## 2018-05-20 LAB — CBC WITH DIFFERENTIAL (CANCER CENTER ONLY)
ABS IMMATURE GRANULOCYTES: 0.02 10*3/uL (ref 0.00–0.07)
BASOS ABS: 0 10*3/uL (ref 0.0–0.1)
Basophils Relative: 0 %
Eosinophils Absolute: 0.2 10*3/uL (ref 0.0–0.5)
Eosinophils Relative: 3 %
HEMATOCRIT: 41.1 % (ref 36.0–46.0)
HEMOGLOBIN: 13.2 g/dL (ref 12.0–15.0)
Immature Granulocytes: 0 %
LYMPHS ABS: 1.4 10*3/uL (ref 0.7–4.0)
LYMPHS PCT: 26 %
MCH: 29.3 pg (ref 26.0–34.0)
MCHC: 32.1 g/dL (ref 30.0–36.0)
MCV: 91.1 fL (ref 80.0–100.0)
Monocytes Absolute: 0.3 10*3/uL (ref 0.1–1.0)
Monocytes Relative: 6 %
NRBC: 0 % (ref 0.0–0.2)
Neutro Abs: 3.5 10*3/uL (ref 1.7–7.7)
Neutrophils Relative %: 65 %
Platelet Count: 146 10*3/uL — ABNORMAL LOW (ref 150–400)
RBC: 4.51 MIL/uL (ref 3.87–5.11)
RDW: 13.4 % (ref 11.5–15.5)
WBC Count: 5.4 10*3/uL (ref 4.0–10.5)

## 2018-05-20 LAB — CMP (CANCER CENTER ONLY)
ALBUMIN: 4.1 g/dL (ref 3.5–5.0)
ALK PHOS: 62 U/L (ref 38–126)
ALT: 20 U/L (ref 0–44)
AST: 23 U/L (ref 15–41)
Anion gap: 8 (ref 5–15)
BILIRUBIN TOTAL: 0.9 mg/dL (ref 0.3–1.2)
BUN: 20 mg/dL (ref 8–23)
CO2: 26 mmol/L (ref 22–32)
Calcium: 10.6 mg/dL — ABNORMAL HIGH (ref 8.9–10.3)
Chloride: 112 mmol/L — ABNORMAL HIGH (ref 98–111)
Creatinine: 0.75 mg/dL (ref 0.44–1.00)
GFR, Est AFR Am: >60 mL/min (ref >60)
GFR, Estimated: >60 mL/min (ref >60)
GLUCOSE: 102 mg/dL — AB (ref 70–99)
Potassium: 4.9 mmol/L (ref 3.5–5.1)
Sodium: 146 mmol/L — ABNORMAL HIGH (ref 135–145)
TOTAL PROTEIN: 7.3 g/dL (ref 6.5–8.1)

## 2018-05-20 LAB — D-DIMER, QUANTITATIVE: D-Dimer, Quant: 0.45 ug/mL-FEU (ref 0.00–0.50)

## 2018-05-20 NOTE — H&P (Signed)
General Surgery [facility]  Loanne Drilling Documented: 05/20/2018 10:42 AM Location: [facility] Patient #: [id number] DOB: 1946-03-27 Widowed / Language: Cleophus Molt / Race: White Female   History of Present Illness Earnstine Regal MD; 05/20/2018 11:13 AM) The patient is a 72 year old female who presents with primary hyperparathyroidism.  CHIEF COMPLAINT: primary hyperparathyroidism  Patient is referred by Dr. Dagmar Hait for surgical evaluation and management of primary hyperparathyroidism. Patient was originally diagnosed in [place] over 4 years ago. She was noted to have hypercalcemia. She underwent evaluation including a nuclear medicine parathyroid scan which was performed in April 2016. This showed a left superior parathyroid adenoma and suggested a right inferior parathyroid adenoma. Calcium levels have ranged from 10.4-10.9. PTH levels have been high normal to slightly high. 24-hour urine collection for calcium was elevated at 261. Bone density scanning did show evidence of osteoporosis. Patient denies nephrolithiasis. She denies fatigue. She has had some memory issues. Patient also underwent an ultrasound examination of the neck on February 16, 2018. This showed bilateral thyroid nodules. Also noted were nodules outside of the thyroid gland which were suspicious for parathyroid adenomas. Patient is now referred to surgery for consideration for neck exploration and parathyroidectomy for management of primary hyperparathyroidism. Of note, the patient is on Xarelto for anticoagulation for left lower extremity deep venous thrombosis in 2017. This is followed by Dr. Burney Gauze.   Past Surgical History Sabino Gasser, CMA; 05/20/2018 10:43 AM) Cataract Surgery  Bilateral. Colon Polyp Removal - Colonoscopy  Hip Surgery  Right. Hysterectomy (not due to cancer) - Complete  Tonsillectomy   Diagnostic Studies History Sabino Gasser, [place];  05/20/2018 10:43 AM) Colonoscopy  1-5 years ago Mammogram  within last year  Allergies Sabino Gasser, [place]; 05/20/2018 10:43 AM) No Known Drug Allergies [05/20/2018]: Allergies Reconciled   Medication History Sabino Gasser, CMA; 05/20/2018 10:44 AM) Xarelto (10MG  Tablet, Oral) Active. Rosuvastatin Calcium (10MG  Tablet, Oral) Active. Citalopram Hydrobromide (10MG  Tablet, Oral) Active. Metoprolol Tartrate (50MG  Tablet, Oral) Active. Vitamin D (Ergocalciferol) (50000UNIT Capsule, Oral) Active. raNITIdine HCl (300MG  Tablet, Oral) Active. Medications Reconciled  Social History Sabino Gasser, CMA; 05/20/2018 10:43 AM) Alcohol use  Occasional alcohol use. Caffeine use  Carbonated beverages. Tobacco use  Never smoker.  Family History Sabino Gasser, [place]; 05/20/2018 10:43 AM) Breast Cancer  Mother.  Pregnancy / Birth History Sabino Gasser, CMA; 05/20/2018 10:43 AM) Age at menarche  45 years. Age of menopause  5-50 Contraceptive History  Contraceptive implant. Gravida  2 Length (months) of breastfeeding  3-6 Maternal age  67-25 Para  2  Other Problems Sabino Gasser, [place]; 05/20/2018 10:43 AM) Anxiety Disorder  Gastroesophageal Reflux Disease  Heart murmur  Hypercholesterolemia  Oophorectomy  Bilateral. Sleep Apnea  Thyroid Disease     Review of Systems Sabino Gasser CMA; 05/20/2018 10:43 AM) General Not Present- Appetite Loss, Chills, Fatigue, Fever, Night Sweats, Weight Gain and Weight Loss. Skin Present- Dryness. Not Present- Change in Wart/Mole, Hives, Jaundice, New Lesions, Non-Healing Wounds, Rash and Ulcer. HEENT Present- Seasonal Allergies and Wears glasses/contact lenses. Not Present- Earache, Hearing Loss, Hoarseness, Nose Bleed, Oral Ulcers, Ringing in the Ears, Sinus Pain, Sore Throat, Visual Disturbances and Yellow Eyes. Respiratory Not Present- Bloody sputum, Chronic Cough, Difficulty Breathing, Snoring and Wheezing. Breast Not Present-  Breast Mass, Breast Pain, Nipple Discharge and Skin Changes. Cardiovascular Not Present- Chest Pain, Difficulty Breathing Lying Down, Leg Cramps, Palpitations, Rapid Heart Rate, Shortness of Breath and Swelling of Extremities. Gastrointestinal Not Present-  Abdominal Pain, Bloating, Bloody Stool, Change in Bowel Habits, Chronic diarrhea, Constipation, Difficulty Swallowing, Excessive gas, Gets full quickly at meals, Hemorrhoids, Indigestion, Nausea, Rectal Pain and Vomiting. Female Genitourinary Present- Frequency and Urgency. Not Present- Nocturia, Painful Urination and Pelvic Pain. Musculoskeletal Not Present- Back Pain, Joint Pain, Joint Stiffness, Muscle Pain, Muscle Weakness and Swelling of Extremities. Neurological Not Present- Decreased Memory, Fainting, Headaches, Numbness, Seizures, Tingling, Tremor, Trouble walking and Weakness. Psychiatric Present- Anxiety. Not Present- Bipolar, Change in Sleep Pattern, Depression, Fearful and Frequent crying. Endocrine Not Present- Cold Intolerance, Excessive Hunger, Hair Changes, Heat Intolerance, Hot flashes and New Diabetes. Hematology Present- Blood Thinners and Easy Bruising. Not Present- Excessive bleeding, Gland problems, HIV and Persistent Infections.  Vitals Sabino Gasser CMA; 05/20/2018 10:45 AM) 05/20/2018 10:44 AM Weight: 198.13 lb Height: 66in Body Surface Area: 1.99 m Body Mass Index: 31.98 kg/m  Temp.: 98.32F(Oral)  Pulse: 104 (Regular)  BP: 124/74 (Sitting, Left Arm, Standard)       Physical Exam Earnstine Regal MD; 05/20/2018 11:13 AM) The physical exam findings are as follows: Note:See vital signs recorded above  GENERAL APPEARANCE Development: normal Nutritional status: normal Gross deformities: none  SKIN Rash, lesions, ulcers: none Induration, erythema: none Nodules: none palpable  EYES Conjunctiva and lids: normal Pupils: equal and reactive Iris: normal bilaterally  EARS, NOSE, MOUTH,  THROAT External ears: no lesion or deformity External nose: no lesion or deformity Hearing: grossly normal Lips: no lesion or deformity Dentition: normal for age Oral mucosa: moist  NECK Symmetric: yes Trachea: midline Thyroid: no palpable nodules in the thyroid bed  CHEST Respiratory effort: normal Retraction or accessory muscle use: no Breath sounds: normal bilaterally Rales, rhonchi, wheeze: none  CARDIOVASCULAR Auscultation: regular rhythm, normal rate Murmurs: none Pulses: carotid and radial pulse 2+ palpable Lower extremity edema: none Lower extremity varicosities: none  MUSCULOSKELETAL Station and gait: normal Digits and nails: no clubbing or cyanosis Muscle strength: grossly normal all extremities Range of motion: grossly normal all extremities Deformity: none  LYMPHATIC Cervical: none palpable Supraclavicular: none palpable  PSYCHIATRIC Oriented to person, place, and time: yes Mood and affect: normal for situation Judgment and insight: appropriate for situation    Assessment & Plan Earnstine Regal MD; 05/20/2018 11:16 AM) PRIMARY HYPERPARATHYROIDISM (E21.0) Current Plans Pt Education - Pamphlet Given - The Parathyroid Surgery Book: discussed with patient and provided information. Patient is referred by her endocrinologist for surgical evaluation for parathyroidectomy. Patient has biochemical evidence of primary hyperparathyroidism. She has complications including osteoporosis. Hypercalcemia may also be contributing to her memory issues.  Diagnostic studies included a sestamibi scan which shows a left superior parathyroid adenoma and a possible right inferior parathyroid adenoma. Ultrasound of the neck does show nodules external to the thyroid gland which may represent parathyroid adenoma).  I have recommended proceeding with neck exploration and parathyroidectomy. We discussed the procedure. We discussed an overnight hospital stay. We discussed risk  and benefits of the surgery including the potential for injury to recurrent laryngeal nerves. Patient understands and wishes to proceed with surgery in the near future. We will make arrangements for surgery at a time convenient for the patient.  Patient will require clearance from her hematologist, and recommendations regarding management of her anticoagulation in the perioperative period.  The risks and benefits of the procedure have been discussed at length with the patient. The patient understands the proposed procedure, potential alternative treatments, and the course of recovery to be expected. All of the patient's questions have been answered at  this time. The patient wishes to proceed with surgery.  Armandina Gemma, [facility] Office: [phone]

## 2018-05-20 NOTE — Progress Notes (Signed)
Hematology and Oncology Follow Up Visit  Gloria Lambert [id number] 23-Jun-1946 72 y.o. 05/20/2018   Principle Diagnosis:   Thrombus of the left femoral vein  Heterozygous for factor V Leiden mutation  Current Therapy:    Xarelto 20 mg by mouth daily-finish 6 months in August 2017       Maintenance Xarelto 10 mg by mouth daily      Interim History:  Gloria Lambert is back for follow-up.  We saw her 6 months ago.  Unfortunately, her husband passed away in 2023/01/08.  He had metastatic lung cancer.  This is still pretty tough on her.  Medically, her problem now is surgery for hyperparathyroidism.  She needs to have surgery within a couple months.  I guess the surgery will not be done until we give the approval about her Xarelto.  She has had no problems with bleeding.  She is only on maintenance Xarelto.  She is had no issues with nausea or vomiting.  She is had no change in bowel or bladder habits.  She says that her bones are osteopenic.  This might be from the hyperparathyroidism.  Her calcium has never been all that high.  Overall, her performance status is ECOG 1.  Medications:  Current Outpatient Medications:  .  calcium carbonate (TUMS - DOSED IN MG ELEMENTAL CALCIUM) 500 MG chewable tablet, Chew 1 tablet by mouth 3 times/day as needed-between meals & bedtime for indigestion or heartburn., Disp: , Rfl:  .  cholecalciferol (VITAMIN D) 1000 units tablet, Take 1,000 Units by mouth daily., Disp: , Rfl:  .  citalopram (CELEXA) 10 MG tablet, TAKE 1 TABLET BY MOUTH EVERY DAY, Disp: 90 tablet, Rfl: 1 .  metoprolol succinate (TOPROL XL) 25 MG 24 hr tablet, Take 1 tablet (25 mg total) by mouth daily., Disp: 90 tablet, Rfl: 3 .  Omega-3 Fatty Acids (FISH OIL) 1200 MG CAPS, Take 1 capsule by mouth daily., Disp: , Rfl:  .  Probiotic Product (PROBIOTIC-10 PO), Take 1 capsule by mouth daily., Disp: , Rfl:  .  ranitidine (ZANTAC) 300 MG tablet, Take 300 mg by mouth daily. , Disp: , Rfl:  .   rosuvastatin (CRESTOR) 10 MG tablet, Take 1 tablet (10 mg total) by mouth daily., Disp: 90 tablet, Rfl: 3 .  XARELTO 10 MG TABS tablet, TAKE 1 TABLET BY MOUTH DAILY WITH SUPPER, Disp: 30 tablet, Rfl: 7 .  ALPRAZolam (XANAX) 0.25 MG tablet, Take 0.25 mg by mouth as needed for anxiety., Disp: , Rfl:  .  metoprolol tartrate (LOPRESSOR) 50 MG tablet, Take Metoprolol, 50mg , one tablet, one hour prior to your coronary CT exam., Disp: 1 tablet, Rfl: 0 .  Vitamin D, Ergocalciferol, (DRISDOL) 50000 units CAPS capsule, Take 50,000 Units by mouth once a week., Disp: , Rfl: 1  Allergies:  Allergies  Allergen Reactions  . Penicillins   . Latex Rash    rash  . Penicillin G Rash    Past Medical History, Surgical history, Social history, and Family History were reviewed and updated.  Review of Systems: Review of Systems  Constitutional: Negative.   HENT: Negative.   Eyes: Negative.   Respiratory: Negative.   Cardiovascular: Negative.   Gastrointestinal: Negative.   Genitourinary: Negative.   Musculoskeletal: Negative.   Skin: Negative.   Neurological: Negative.   Endo/Heme/Allergies: Negative.   Psychiatric/Behavioral: Negative.      Physical Exam:  weight is 196 lb 12.8 oz (89.3 kg). Her oral temperature is 97.8 F (36.6 C). Her blood pressure  is 128/59 (abnormal) and her pulse is 86. Her respiration is 16 and oxygen saturation is 98%.   Wt Readings from Last 3 Encounters:  05/20/18 196 lb 12.8 oz (89.3 kg)  05/19/18 195 lb 1.9 oz (88.5 kg)  03/31/18 199 lb 12.8 oz (90.6 kg)     Physical Exam  Constitutional: She is oriented to person, place, and time.  HENT:  Head: Normocephalic and atraumatic.  Mouth/Throat: Oropharynx is clear and moist.  Eyes: Pupils are equal, round, and reactive to light. EOM are normal.  Neck: Normal range of motion.  Cardiovascular: Normal rate, regular rhythm and normal heart sounds.  Pulmonary/Chest: Effort normal and breath sounds normal.  Abdominal:  Soft. Bowel sounds are normal.  Musculoskeletal: Normal range of motion. She exhibits no edema, tenderness or deformity.  Lymphadenopathy:    She has no cervical adenopathy.  Neurological: She is alert and oriented to person, place, and time.  Skin: Skin is warm and dry. No rash noted. No erythema.  Psychiatric: She has a normal mood and affect. Her behavior is normal. Judgment and thought content normal.  Vitals reviewed.      Lab Results  Component Value Date   WBC 5.4 05/20/2018   HGB 13.2 05/20/2018   HCT 41.1 05/20/2018   MCV 91.1 05/20/2018   PLT 146 (L) 05/20/2018     Chemistry      Component Value Date/Time   NA 145 11/13/2017 1414   NA 148 (H) 05/15/2017 1452   NA 143 07/10/2016 1119   K 5.0 (H) 11/13/2017 1414   K 4.4 05/15/2017 1452   K 4.4 07/10/2016 1119   CL 110 (H) 11/13/2017 1414   CL 106 05/15/2017 1452   CO2 32 11/13/2017 1414   CO2 26 05/15/2017 1452   CO2 25 07/10/2016 1119   BUN 16 11/13/2017 1414   BUN 16 05/15/2017 1452   BUN 14.7 07/10/2016 1119   CREATININE 0.70 05/01/2018 0842   CREATININE 0.70 11/13/2017 1414   CREATININE 0.7 05/15/2017 1452   CREATININE 0.8 07/10/2016 1119      Component Value Date/Time   CALCIUM 10.7 (H) 11/13/2017 1414   CALCIUM 10.3 05/15/2017 1452   CALCIUM 10.3 07/10/2016 1119   ALKPHOS 51 11/13/2017 1414   ALKPHOS 51 05/15/2017 1452   ALKPHOS 52 07/10/2016 1119   AST 32 11/13/2017 1414   AST 19 07/10/2016 1119   ALT 27 11/13/2017 1414   ALT 28 05/15/2017 1452   ALT 14 07/10/2016 1119   BILITOT 1.5 11/13/2017 1414   BILITOT 1.03 07/10/2016 1119         Impression and Plan: Gloria Lambert is a 72 year old white female. She is heterozygous for the factor V Leiden. She has a DVT in the left leg. I'm sure she will have a chronic thrombus.   I do not see any problem with her having surgery.  There should be no issues at all with respect to hypercoagulability.  Her factor V Leiden should not be an issue at  all.  I will plan to get her back in 6 more months.  Volanda Napoleon, MD 11/6/20191:28 PM

## 2018-06-16 ENCOUNTER — Other Ambulatory Visit: Payer: Medicare Other

## 2018-06-16 DIAGNOSIS — E785 Hyperlipidemia, unspecified: Secondary | ICD-10-CM | POA: Diagnosis not present

## 2018-06-16 LAB — HEPATIC FUNCTION PANEL
ALT: 16 IU/L (ref 0–32)
AST: 20 IU/L (ref 0–40)
Albumin: 4.2 g/dL (ref 3.5–4.8)
Alkaline Phosphatase: 47 IU/L (ref 39–117)
BILIRUBIN TOTAL: 0.9 mg/dL (ref 0.0–1.2)
BILIRUBIN, DIRECT: 0.22 mg/dL (ref 0.00–0.40)
Total Protein: 6 g/dL (ref 6.0–8.5)

## 2018-06-16 LAB — LIPID PANEL
CHOL/HDL RATIO: 3 ratio (ref 0.0–4.4)
CHOLESTEROL TOTAL: 136 mg/dL (ref 100–199)
HDL: 45 mg/dL (ref >39)
LDL CALC: 68 mg/dL (ref 0–99)
TRIGLYCERIDES: 115 mg/dL (ref 0–149)
VLDL Cholesterol Cal: 23 mg/dL (ref 5–40)

## 2018-07-17 ENCOUNTER — Other Ambulatory Visit: Payer: Self-pay | Admitting: Hematology & Oncology

## 2018-07-17 DIAGNOSIS — I82512 Chronic embolism and thrombosis of left femoral vein: Secondary | ICD-10-CM

## 2018-07-24 ENCOUNTER — Encounter (HOSPITAL_COMMUNITY): Payer: Self-pay

## 2018-07-24 NOTE — Patient Instructions (Addendum)
Your procedure is scheduled on: Thursday, Jan. 16, 2020   Surgery Time:  8:30AM-10:30AM   Report to [place]  Entrance    Report to admitting at 6:30 AM   Call this number if you have problems the morning of surgery [phone]   Bring CPAP mask and tubing day of surgery   Do not eat food or drink liquids :After Midnight.   Brush your teeth the morning of surgery.   Do NOT smoke after Midnight   Take these medicines the morning of surgery with A SIP OF WATER: Citalopram, Rosuvastatin, Xanax if needed                               You may not have any metal on your body including hair pins, jewelry, and body piercings             Do not wear make-up, lotions, powders, perfumes/cologne, or deodorant             Do not wear nail polish.  Do not shave  48 hours prior to surgery.                Do not bring valuables to the hospital. [place].   Contacts, dentures or bridgework may not be worn into surgery.   Leave suitcase in the car. After surgery it may be brought to your room.    Special Instructions: Bring a copy of your healthcare power of attorney and living will documents         the day of surgery if you haven't scanned them in before.              Please read over the following fact sheets you were given:  [facility] - Preparing for Surgery Before surgery, you can play an important role.  Because skin is not sterile, your skin needs to be as free of germs as possible.  You can reduce the number of germs on your skin by washing with CHG (chlorahexidine gluconate) soap before surgery.  CHG is an antiseptic cleaner which kills germs and bonds with the skin to continue killing germs even after washing. Please DO NOT use if you have an allergy to CHG or antibacterial soaps.  If your skin becomes reddened/irritated stop using the CHG and inform your nurse when you arrive at Short Stay. Do not shave  (including legs and underarms) for at least 48 hours prior to the first CHG shower.  You may shave your face/neck.  Please follow these instructions carefully:  1.  Shower with CHG Soap the night before surgery and the  morning of surgery.  2.  If you choose to wash your hair, wash your hair first as usual with your normal  shampoo.  3.  After you shampoo, rinse your hair and body thoroughly to remove the shampoo.                             4.  Use CHG as you would any other liquid soap.  You can apply chg directly to the skin and wash.  Gently with a scrungie or clean washcloth.  5.  Apply the CHG Soap to your body ONLY FROM THE NECK DOWN.   Do  not use on face/ open                           Wound or open sores. Avoid contact with eyes, ears mouth and   genitals (private parts).                       Wash face,  Genitals (private parts) with your normal soap.             6.  Wash thoroughly, paying special attention to the area where your    surgery  will be performed.  7.  Thoroughly rinse your body with warm water from the neck down.  8.  DO NOT shower/wash with your normal soap after using and rinsing off the CHG Soap.                9.  Pat yourself dry with a clean towel.            10.  Wear clean pajamas.            11.  Place clean sheets on your bed the night of your first shower and do not  sleep with pets. Day of Surgery : Do not apply any lotions/deodorants the morning of surgery.  Please wear clean clothes to the hospital/surgery center.  FAILURE TO FOLLOW THESE INSTRUCTIONS MAY RESULT IN THE CANCELLATION OF YOUR SURGERY  PATIENT SIGNATURE_________________________________  NURSE SIGNATURE__________________________________  ________________________________________________________________________

## 2018-07-24 NOTE — Pre-Procedure Instructions (Signed)
The following are in epic: Surgical clearance and last office visit note Dr. Marin Olp 05/20/2018 Last office visit note S. Weaver 05/19/2018 EKG 03/21/2018 ECHO 04/11/2018

## 2018-07-27 ENCOUNTER — Encounter (HOSPITAL_COMMUNITY): Payer: Self-pay

## 2018-07-27 ENCOUNTER — Other Ambulatory Visit: Payer: Self-pay

## 2018-07-27 ENCOUNTER — Encounter (HOSPITAL_COMMUNITY)
Admission: RE | Admit: 2018-07-27 | Discharge: 2018-07-27 | Disposition: A | Discharge status: Home / Self Care | Payer: Medicare Other | Source: Ambulatory Visit | Attending: Surgery | Admitting: Surgery

## 2018-07-27 DIAGNOSIS — E042 Nontoxic multinodular goiter: Secondary | ICD-10-CM | POA: Diagnosis not present

## 2018-07-27 DIAGNOSIS — E210 Primary hyperparathyroidism: Secondary | ICD-10-CM | POA: Diagnosis not present

## 2018-07-27 DIAGNOSIS — D351 Benign neoplasm of parathyroid gland: Secondary | ICD-10-CM | POA: Diagnosis not present

## 2018-07-27 DIAGNOSIS — E7800 Pure hypercholesterolemia, unspecified: Secondary | ICD-10-CM | POA: Diagnosis not present

## 2018-07-27 DIAGNOSIS — Z01812 Encounter for preprocedural laboratory examination: Secondary | ICD-10-CM

## 2018-07-27 DIAGNOSIS — Z79899 Other long term (current) drug therapy: Secondary | ICD-10-CM | POA: Diagnosis not present

## 2018-07-27 DIAGNOSIS — Z7901 Long term (current) use of anticoagulants: Secondary | ICD-10-CM | POA: Diagnosis not present

## 2018-07-27 DIAGNOSIS — F329 Major depressive disorder, single episode, unspecified: Secondary | ICD-10-CM | POA: Diagnosis not present

## 2018-07-27 DIAGNOSIS — Z86718 Personal history of other venous thrombosis and embolism: Secondary | ICD-10-CM | POA: Diagnosis not present

## 2018-07-27 DIAGNOSIS — K219 Gastro-esophageal reflux disease without esophagitis: Secondary | ICD-10-CM | POA: Diagnosis not present

## 2018-07-27 HISTORY — DX: Gastro-esophageal reflux disease without esophagitis: K21.9

## 2018-07-27 HISTORY — DX: Depression, unspecified: F32.A

## 2018-07-27 HISTORY — DX: Unspecified hemorrhoids: K64.9

## 2018-07-27 HISTORY — DX: Personal history of other diseases of the digestive system: Z87.19

## 2018-07-27 HISTORY — DX: Unspecified osteoarthritis, unspecified site: M19.90

## 2018-07-27 HISTORY — DX: Stress incontinence (female) (male): N39.3

## 2018-07-27 HISTORY — DX: Synovial cyst of popliteal space (Baker), right knee: M71.21

## 2018-07-27 HISTORY — DX: Diverticulosis of intestine, part unspecified, without perforation or abscess without bleeding: K57.90

## 2018-07-27 HISTORY — DX: Major depressive disorder, single episode, unspecified: F32.9

## 2018-07-27 HISTORY — DX: Edema, unspecified: R60.9

## 2018-07-27 HISTORY — DX: Other specified disorders of bone density and structure, unspecified site: M85.80

## 2018-07-27 HISTORY — DX: Sleep apnea, unspecified: G47.30

## 2018-07-27 HISTORY — DX: Cardiac murmur, unspecified: R01.1

## 2018-07-27 HISTORY — DX: Personal history of colonic polyps: Z86.010

## 2018-07-27 HISTORY — DX: Obesity, unspecified: E66.9

## 2018-07-27 HISTORY — DX: Personal history of colon polyps, unspecified: Z86.0100

## 2018-07-27 LAB — BASIC METABOLIC PANEL
Anion gap: 7 (ref 5–15)
BUN: 20 mg/dL (ref 8–23)
CO2: 25 mmol/L (ref 22–32)
Calcium: 10.4 mg/dL — ABNORMAL HIGH (ref 8.9–10.3)
Chloride: 110 mmol/L (ref 98–111)
Creatinine, Ser: 0.66 mg/dL (ref 0.44–1.00)
Glucose, Bld: 112 mg/dL — ABNORMAL HIGH (ref 70–99)
Potassium: 4.9 mmol/L (ref 3.5–5.1)
Sodium: 142 mmol/L (ref 135–145)

## 2018-07-27 LAB — CBC
HCT: 40.1 % (ref 36.0–46.0)
Hemoglobin: 13.1 g/dL (ref 12.0–15.0)
MCH: 30.3 pg (ref 26.0–34.0)
MCHC: 32.7 g/dL (ref 30.0–36.0)
MCV: 92.8 fL (ref 80.0–100.0)
PLATELETS: 150 10*3/uL (ref 150–400)
RBC: 4.32 MIL/uL (ref 3.87–5.11)
RDW: 13.2 % (ref 11.5–15.5)
WBC: 4.3 10*3/uL (ref 4.0–10.5)
nRBC: 0 % (ref 0.0–0.2)

## 2018-07-29 ENCOUNTER — Encounter (HOSPITAL_COMMUNITY): Payer: Self-pay | Admitting: Surgery

## 2018-07-29 NOTE — Progress Notes (Signed)
Anesthesia Chart Review   Case:  [id number] Date/Time:  07/30/18 0815   Procedure:  PARATHYROIDECTOMY WITH NECK EXPLORATION (N/A )   Anesthesia type:  General   Pre-op diagnosis:  PRIMARY HYPERPARATHYROIDISM   Location:  [facility]   Surgeon:  Armandina Gemma, MD      DISCUSSION: 73 yo never smoker with heterzygous factor V Leiden with h/o DVT (left femoral vein 11/06/2015), hpyerparathyroidism, GERD, hiatal hernia, sleep apnea (without CPAP use), HLD, CAD, depression, scheduled for above surgery on 07/30/18 with Dr. Armandina Gemma.   Pt is currently on Xarelto daily prescribed by Dr. Burney Gauze.  She was last seen by Dr. Marin Olp 05/20/18.  He states, "I do not see any problem with her having surgery.  There should be no issues at all with respect to hypercoagulability.  Her factor V Leiden should not be an issue at all. I will plan to get her back in 6 more months." Last dose Xarelto 07/29/18, holding 24 hours prior to surgery.   Pt is followed by cardiology for mild cardiomyopathy.  Echo with no significant valve disease, EF 45-50%.  CTA demonstrated LAD plaque, no significant stenosis; consistent with non-ischemic cardiomyopathy.  She was last seen by Richardson Dopp, PA-C on 05/19/18.   Pt can proceed with planned procedure barring acute status change.  VS: BP 119/66   Pulse 65   Temp 37.3 C (Oral)   Resp 20   Ht 5\' 6"  (1.676 m)   Wt 90.8 kg   SpO2 99%   BMI 32.32 kg/m   PROVIDERS: Nickola Major, MD is PCP   Sherren Mocha, MD is Cardiologist  Minette Brine, MD is Endocrinologist  Burney Gauze, MD is hematologist  LABS: Labs reviewed: Acceptable for surgery. (all labs ordered are listed, but only abnormal results are displayed)  Labs Reviewed  BASIC METABOLIC PANEL - Abnormal; Notable for the following components:      Result Value   Glucose, Bld 112 (*)    Calcium 10.4 (*)    All other components within normal limits  CBC     IMAGES: CT Chest  05/01/18 FINDINGS: The visualized portions of the lower lung fields show no suspicious nodules, masses, or infiltrates. The visualized portions of the mediastinum and chest wall are unremarkable, except for a small to moderate size hiatal hernia.  IMPRESSION: Small to moderate hiatal hernia. No other significant non-cardiac abnormality in visualized portion of the thorax.  EKG: 03/31/18  Rate 69bpm Normal sinus rhythm Normal ECG  CV: Echo 04/10/18 Study Conclusions  - Left ventricle: The cavity size was normal. Wall thickness was   increased in a pattern of moderate LVH. Systolic function was   mildly reduced. The estimated ejection fraction was in the range   of 45% to 50%. Anterolateral wall hypokinesis. Doppler parameters   are consistent with abnormal left ventricular relaxation (grade 1   diastolic dysfunction). The E/e&' ratio is between 8-15,   suggesting indeterminate LV filling pressure. - Mitral valve: Mildly thickened leaflets . There was trivial   regurgitation. - Left atrium: The atrium was normal in size. - Inferior vena cava: The vessel was normal in size. The   respirophasic diameter changes were in the normal range (= 50%),   consistent with normal central venous pressure.  Impressions:  - LVEF 45-50%, moderate LVH, anterolateral wall hypokinesis, grade   1 DD, indeterminate LV filing pressure, trivial MR, normal LA   size, normal IVC. Past Medical History:  Diagnosis Date  . Baker's cyst of knee, right   . 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 Coronary artery calcification seen on CT scan    Coronary CTA 10/19:  Ca score 12 (49th percentile - intermediate risk); mid LAD calcified plaque; no obstructive CAD; Small to mod hiatal hernia  . Depression   . Dilated cardiomyopathy ([place]) 05/19/2018   Echo 04/10/18 - Mod LVH, EF 45-50, ant-lat HK, Gr 1 DD, trivial MR  . Diverticulosis   . Edema   . GERD  (gastroesophageal reflux disease)   . Heart murmur 03/2018  . Hemorrhoids   . Heterozygous factor V Leiden mutation ([place]) 11/06/2015  . History of colon polyps   . History of echocardiogram    Echo 9/19: mod LVH, EF 45-50, ant-lat HK, Gr 1 DD, trivial MR  . History of hiatal hernia 05/01/2018   Noted on CT  . HLD (hyperlipidemia)   . Hyperparathyroidism ([place])    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 - surgery not recommended  . OA (osteoarthritis)   . Obese   . Osteopenia   . Sleep apnea    does not use CPAP because of fit  . Stress incontinence     Past Surgical History:  Procedure Laterality Date  . arthroscopic knee surgery Right   . BILATERAL OOPHORECTOMY  1998  . BLADDER SUSPENSION  1998  . COLONOSCOPY    . HIP ARTHROPLASTY Right    total hip replacement  . TONSILLECTOMY    . VAGINAL HYSTERECTOMY  1998    MEDICATIONS: . ALPRAZolam (XANAX) 0.25 MG tablet  . Alum Hydroxide-Mag Trisilicate (GAVISCON) 16-01.0 MG CHEW  . citalopram (CELEXA) 10 MG tablet  . metoprolol succinate (TOPROL XL) 25 MG 24 hr tablet  . metoprolol tartrate (LOPRESSOR) 50 MG tablet  . Multiple Vitamins-Minerals (MULTIVITAMIN PO)  . Omega-3 Fatty Acids (FISH OIL PO)  . Probiotic Product (PROBIOTIC-10 PO)  . ranitidine (ZANTAC) 300 MG tablet  . rosuvastatin (CRESTOR) 10 MG tablet  . XARELTO 10 MG TABS tablet   No current facility-administered medications for this encounter.     Maia Plan [facility] Pre-Surgical Testing [phone] 07/29/18 10:22 AM

## 2018-07-29 NOTE — H&P (Signed)
General Surgery [facility]  Gloria Lambert DOB: 06-02-1946 Widowed / Language: Cleophus Molt / Race: White Female   History of Present Illness The patient is a 73 year old female who presents with primary hyperparathyroidism.  CHIEF COMPLAINT: primary hyperparathyroidism  Patient is referred by Dr. Dagmar Hait for surgical evaluation and management of primary hyperparathyroidism. Patient was originally diagnosed in [place] over 4 years ago. She was noted to have hypercalcemia. She underwent evaluation including a nuclear medicine parathyroid scan which was performed in April 2016. This showed a left superior parathyroid adenoma and suggested a right inferior parathyroid adenoma. Calcium levels have ranged from 10.4-10.9. PTH levels have been high normal to slightly high. 24-hour urine collection for calcium was elevated at 261. Bone density scanning did show evidence of osteoporosis. Patient denies nephrolithiasis. She denies fatigue. She has had some memory issues. Patient also underwent an ultrasound examination of the neck on February 16, 2018. This showed bilateral thyroid nodules. Also noted were nodules outside of the thyroid gland which were suspicious for parathyroid adenomas. Patient is now referred to surgery for consideration for neck exploration and parathyroidectomy for management of primary hyperparathyroidism. Of note, the patient is on Xarelto for anticoagulation for left lower extremity deep venous thrombosis in 2017. This is followed by Dr. Burney Gauze.   Past Surgical History Cataract Surgery  Bilateral. Colon Polyp Removal - Colonoscopy  Hip Surgery  Right. Hysterectomy (not due to cancer) - Complete  Tonsillectomy   Diagnostic Studies History Colonoscopy  1-5 years ago Mammogram  within last year  Allergies No Known Drug Allergies [05/20/2018]: Allergies Reconciled   Medication History Xarelto (10MG  Tablet, Oral)  Active. Rosuvastatin Calcium (10MG  Tablet, Oral) Active. Citalopram Hydrobromide (10MG  Tablet, Oral) Active. Metoprolol Tartrate (50MG  Tablet, Oral) Active. Vitamin D (Ergocalciferol) (50000UNIT Capsule, Oral) Active. raNITIdine HCl (300MG  Tablet, Oral) Active. Medications Reconciled  Social History  Alcohol use  Occasional alcohol use. Caffeine use  Carbonated beverages. Tobacco use  Never smoker.  Family History Breast Cancer  Mother.  Pregnancy / Birth History Age at menarche  20 years. Age of menopause  15-50 Contraceptive History  Contraceptive implant. Gravida  2 Length (months) of breastfeeding  3-6 Maternal age  72-25 Para  2  Other Problems  Anxiety Disorder  Gastroesophageal Reflux Disease  Heart murmur  Hypercholesterolemia  Oophorectomy  Bilateral. Sleep Apnea  Thyroid Disease     Review of Systems General Not Present- Appetite Loss, Chills, Fatigue, Fever, Night Sweats, Weight Gain and Weight Loss. Skin Present- Dryness. Not Present- Change in Wart/Mole, Hives, Jaundice, New Lesions, Non-Healing Wounds, Rash and Ulcer. HEENT Present- Seasonal Allergies and Wears glasses/contact lenses. Not Present- Earache, Hearing Loss, Hoarseness, Nose Bleed, Oral Ulcers, Ringing in the Ears, Sinus Pain, Sore Throat, Visual Disturbances and Yellow Eyes. Respiratory Not Present- Bloody sputum, Chronic Cough, Difficulty Breathing, Snoring and Wheezing. Breast Not Present- Breast Mass, Breast Pain, Nipple Discharge and Skin Changes. Cardiovascular Not Present- Chest Pain, Difficulty Breathing Lying Down, Leg Cramps, Palpitations, Rapid Heart Rate, Shortness of Breath and Swelling of Extremities. Gastrointestinal Not Present- Abdominal Pain, Bloating, Bloody Stool, Change in Bowel Habits, Chronic diarrhea, Constipation, Difficulty Swallowing, Excessive gas, Gets full quickly at meals, Hemorrhoids, Indigestion, Nausea, Rectal Pain and Vomiting. Female  Genitourinary Present- Frequency and Urgency. Not Present- Nocturia, Painful Urination and Pelvic Pain. Musculoskeletal Not Present- Back Pain, Joint Pain, Joint Stiffness, Muscle Pain, Muscle Weakness and Swelling of Extremities. Neurological Not Present- Decreased Memory, Fainting, Headaches, Numbness, Seizures, Tingling, Tremor,  Trouble walking and Weakness. Psychiatric Present- Anxiety. Not Present- Bipolar, Change in Sleep Pattern, Depression, Fearful and Frequent crying. Endocrine Not Present- Cold Intolerance, Excessive Hunger, Hair Changes, Heat Intolerance, Hot flashes and New Diabetes. Hematology Present- Blood Thinners and Easy Bruising. Not Present- Excessive bleeding, Gland problems, HIV and Persistent Infections.  Vitals Weight: 198.13 lb Height: 66in Body Surface Area: 1.99 m Body Mass Index: 31.98 kg/m  Temp.: 98.34F(Oral)  Pulse: 104 (Regular)  BP: 124/74 (Sitting, Left Arm, Standard)   Physical Exam  See vital signs recorded above  GENERAL APPEARANCE Development: normal Nutritional status: normal Gross deformities: none  SKIN Rash, lesions, ulcers: none Induration, erythema: none Nodules: none palpable  EYES Conjunctiva and lids: normal Pupils: equal and reactive Iris: normal bilaterally  EARS, NOSE, MOUTH, THROAT External ears: no lesion or deformity External nose: no lesion or deformity Hearing: grossly normal Lips: no lesion or deformity Dentition: normal for age Oral mucosa: moist  NECK Symmetric: yes Trachea: midline Thyroid: no palpable nodules in the thyroid bed  CHEST Respiratory effort: normal Retraction or accessory muscle use: no Breath sounds: normal bilaterally Rales, rhonchi, wheeze: none  CARDIOVASCULAR Auscultation: regular rhythm, normal rate Murmurs: none Pulses: carotid and radial pulse 2+ palpable Lower extremity edema: none Lower extremity varicosities: none  MUSCULOSKELETAL Station and gait:  normal Digits and nails: no clubbing or cyanosis Muscle strength: grossly normal all extremities Range of motion: grossly normal all extremities Deformity: none  LYMPHATIC Cervical: none palpable Supraclavicular: none palpable  PSYCHIATRIC Oriented to person, place, and time: yes Mood and affect: normal for situation Judgment and insight: appropriate for situation    Assessment & Plan   PRIMARY HYPERPARATHYROIDISM (E21.0)  Pt Education - Pamphlet Given - The Parathyroid Surgery Book: discussed with patient and provided information. Patient is referred by her endocrinologist for surgical evaluation for parathyroidectomy. Patient has biochemical evidence of primary hyperparathyroidism. She has complications including osteoporosis. Hypercalcemia may also be contributing to her memory issues.  Diagnostic studies included a sestamibi scan which shows a left superior parathyroid adenoma and a possible right inferior parathyroid adenoma. Ultrasound of the neck does show nodules external to the thyroid gland which may represent parathyroid adenoma).  I have recommended proceeding with neck exploration and parathyroidectomy. We discussed the procedure. We discussed an overnight hospital stay. We discussed risk and benefits of the surgery including the potential for injury to recurrent laryngeal nerves. Patient understands and wishes to proceed with surgery in the near future. We will make arrangements for surgery at a time convenient for the patient.  Patient will require clearance from her hematologist, and recommendations regarding management of her anticoagulation in the perioperative period.  The risks and benefits of the procedure have been discussed at length with the patient. The patient understands the proposed procedure, potential alternative treatments, and the course of recovery to be expected. All of the patient's questions have been answered at this time. The patient  wishes to proceed with surgery.   Armandina Gemma, [facility] Office: [phone]

## 2018-07-29 NOTE — Anesthesia Preprocedure Evaluation (Addendum)
Anesthesia Evaluation  Patient identified by MRN, date of birth, ID band Patient awake    Reviewed: Allergy & Precautions, NPO status , Patient's Chart, lab work & pertinent test results, reviewed documented beta blocker date and time   History of Anesthesia Complications Negative for: history of anesthetic complications  Airway Mallampati: IV  TM Distance: <3 FB Neck ROM: Full    Dental  (+) Teeth Intact   Pulmonary sleep apnea ,    breath sounds clear to auscultation       Cardiovascular hypertension, Pt. on medications and Pt. on home beta blockers (-) angina+ CAD  (-) Past MI and (-) CHF (-) dysrhythmias + Valvular Problems/Murmurs  Rhythm:Regular  Coronary CTA 10/19:  Ca score 12 (49th percentile - intermediate risk); mid LAD calcified plaque; no obstructive CAD; Small to mod hiatal hernia   Neuro/Psych PSYCHIATRIC DISORDERS Depression negative neurological ROS     GI/Hepatic Neg liver ROS, hiatal hernia, GERD  ,  Endo/Other  negative endocrine ROS  Renal/GU negative Renal ROS     Musculoskeletal  (+) Arthritis ,   Abdominal   Peds  Hematology   Anesthesia Other Findings  - Left ventricle: The cavity size was normal. Wall thickness was   increased in a pattern of moderate LVH. Systolic function was   mildly reduced. The estimated ejection fraction was in the range   of 45% to 50%. Anterolateral wall hypokinesis. Doppler parameters   are consistent with abnormal left ventricular relaxation (grade 1   diastolic dysfunction). The E/e&' ratio is between 8-15,   suggesting indeterminate LV filling pressure. - Mitral valve: Mildly thickened leaflets . There was trivial   regurgitation. - Left atrium: The atrium was normal in size. - Inferior vena cava: The vessel was normal in size. The   respirophasic diameter changes were in the normal range (= 50%),   consistent with normal central venous pressure.   Reproductive/Obstetrics                            Anesthesia Physical Anesthesia Plan  ASA: II  Anesthesia Plan: General   Post-op Pain Management:    Induction: Intravenous  PONV Risk Score and Plan: 3 and Ondansetron and Dexamethasone  Airway Management Planned: Oral ETT  Additional Equipment: None  Intra-op Plan:   Post-operative Plan: Extubation in OR  Informed Consent: I have reviewed the patients History and Physical, chart, labs and discussed the procedure including the risks, benefits and alternatives for the proposed anesthesia with the patient or authorized representative who has indicated his/her understanding and acceptance.     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 Armandina Gemma.   Pt is currently on Xarelto daily prescribed by Dr. Burney Gauze.  She was last seen by Dr. Marin Olp 05/20/18.  He states, "I do not see any problem with her having surgery. There should be no issues at all with respect to hypercoagulability. Her factor V Leiden should not be an issue at all. I will plan to get her back in 6 more months." Last dose Xarelto 07/29/18, holding 24 hours prior to surgery.   Pt is followed by cardiology for mild  cardiomyopathy.  Echo with no significant valve disease, EF 45-50%.  CTA demonstrated LAD plaque, no significant stenosis; consistent with non-ischemic cardiomyopathy.  She was last seen by Richardson Dopp, PA-C on 05/19/18. )       Anesthesia Quick Evaluation

## 2018-07-30 ENCOUNTER — Ambulatory Visit (HOSPITAL_COMMUNITY): Payer: Medicare Other | Admitting: Registered Nurse

## 2018-07-30 ENCOUNTER — Encounter (HOSPITAL_COMMUNITY): Payer: Self-pay | Admitting: Registered Nurse

## 2018-07-30 ENCOUNTER — Observation Stay (HOSPITAL_COMMUNITY)
Admission: RE | Admit: 2018-07-30 | Discharge: 2018-07-31 | Disposition: A | Discharge status: Home / Self Care | Payer: Medicare Other | Attending: Surgery | Admitting: Surgery

## 2018-07-30 ENCOUNTER — Ambulatory Visit (HOSPITAL_COMMUNITY): Payer: Medicare Other | Admitting: Physician Assistant

## 2018-07-30 ENCOUNTER — Encounter (HOSPITAL_COMMUNITY)
Admission: RE | Disposition: A | Discharge status: Home / Self Care | Payer: Self-pay | Source: Home / Self Care | Attending: Surgery

## 2018-07-30 DIAGNOSIS — I2510 Atherosclerotic heart disease of native coronary artery without angina pectoris: Secondary | ICD-10-CM | POA: Diagnosis not present

## 2018-07-30 DIAGNOSIS — Z79899 Other long term (current) drug therapy: Secondary | ICD-10-CM | POA: Diagnosis not present

## 2018-07-30 DIAGNOSIS — Z86718 Personal history of other venous thrombosis and embolism: Secondary | ICD-10-CM | POA: Insufficient documentation

## 2018-07-30 DIAGNOSIS — E785 Hyperlipidemia, unspecified: Secondary | ICD-10-CM | POA: Diagnosis not present

## 2018-07-30 DIAGNOSIS — E049 Nontoxic goiter, unspecified: Secondary | ICD-10-CM | POA: Diagnosis not present

## 2018-07-30 DIAGNOSIS — K219 Gastro-esophageal reflux disease without esophagitis: Secondary | ICD-10-CM | POA: Insufficient documentation

## 2018-07-30 DIAGNOSIS — F329 Major depressive disorder, single episode, unspecified: Secondary | ICD-10-CM | POA: Insufficient documentation

## 2018-07-30 DIAGNOSIS — E042 Nontoxic multinodular goiter: Secondary | ICD-10-CM | POA: Insufficient documentation

## 2018-07-30 DIAGNOSIS — E213 Hyperparathyroidism, unspecified: Secondary | ICD-10-CM

## 2018-07-30 DIAGNOSIS — E210 Primary hyperparathyroidism: Secondary | ICD-10-CM | POA: Diagnosis present

## 2018-07-30 DIAGNOSIS — Z7901 Long term (current) use of anticoagulants: Secondary | ICD-10-CM | POA: Insufficient documentation

## 2018-07-30 DIAGNOSIS — I10 Essential (primary) hypertension: Secondary | ICD-10-CM | POA: Diagnosis not present

## 2018-07-30 DIAGNOSIS — E7800 Pure hypercholesterolemia, unspecified: Secondary | ICD-10-CM | POA: Diagnosis not present

## 2018-07-30 DIAGNOSIS — D351 Benign neoplasm of parathyroid gland: Secondary | ICD-10-CM | POA: Diagnosis not present

## 2018-07-30 HISTORY — PX: PARATHYROIDECTOMY: SHX19

## 2018-07-30 SURGERY — PARATHYROIDECTOMY
Anesthesia: General

## 2018-07-30 MED ORDER — BUPIVACAINE HCL (PF) 0.25 % IJ SOLN
INTRAMUSCULAR | Status: AC
  Filled 2018-07-30: qty 30

## 2018-07-30 MED ORDER — KETOROLAC TROMETHAMINE 30 MG/ML IJ SOLN
INTRAMUSCULAR | Status: AC
  Filled 2018-07-30: qty 1

## 2018-07-30 MED ORDER — CIPROFLOXACIN IN D5W 400 MG/200ML IV SOLN
400.0000 mg | INTRAVENOUS | Status: AC
  Administered 2018-07-30: 400 mg via INTRAVENOUS
  Filled 2018-07-30: qty 200

## 2018-07-30 MED ORDER — HYDROCODONE-ACETAMINOPHEN 5-325 MG PO TABS
1.0000 | ORAL_TABLET | ORAL | Status: DC | PRN
  Administered 2018-07-30 (×2): 2 via ORAL
  Administered 2018-07-31 (×2): 1 via ORAL
  Filled 2018-07-30: qty 1
  Filled 2018-07-30 (×2): qty 2
  Filled 2018-07-30: qty 1

## 2018-07-30 MED ORDER — ROCURONIUM BROMIDE 10 MG/ML (PF) SYRINGE
PREFILLED_SYRINGE | INTRAVENOUS | Status: DC | PRN
  Administered 2018-07-30: 60 mg via INTRAVENOUS

## 2018-07-30 MED ORDER — KCL IN DEXTROSE-NACL 20-5-0.45 MEQ/L-%-% IV SOLN
INTRAVENOUS | Status: DC
  Administered 2018-07-30 – 2018-07-31 (×2): via INTRAVENOUS
  Filled 2018-07-30 (×2): qty 1000

## 2018-07-30 MED ORDER — ONDANSETRON 4 MG PO TBDP: 4.0000 mg | ORAL_TABLET | Freq: Four times a day (QID) | ORAL | Status: DC | PRN

## 2018-07-30 MED ORDER — LIDOCAINE 2% (20 MG/ML) 5 ML SYRINGE
INTRAMUSCULAR | Status: DC | PRN
  Administered 2018-07-30: 50 mg via INTRAVENOUS

## 2018-07-30 MED ORDER — BUPIVACAINE HCL 0.25 % IJ SOLN
INTRAMUSCULAR | Status: DC | PRN
  Administered 2018-07-30: 10 mL

## 2018-07-30 MED ORDER — HYDROMORPHONE HCL 1 MG/ML IJ SOLN: 1.0000 mg | INTRAMUSCULAR | Status: DC | PRN

## 2018-07-30 MED ORDER — ACETAMINOPHEN 500 MG PO TABS: 1000.0000 mg | ORAL_TABLET | Freq: Once | ORAL | Status: DC | PRN

## 2018-07-30 MED ORDER — FENTANYL CITRATE (PF) 250 MCG/5ML IJ SOLN
INTRAMUSCULAR | Status: DC | PRN
  Administered 2018-07-30 (×3): 50 ug via INTRAVENOUS
  Administered 2018-07-30: 100 ug via INTRAVENOUS

## 2018-07-30 MED ORDER — DEXAMETHASONE SODIUM PHOSPHATE 10 MG/ML IJ SOLN
INTRAMUSCULAR | Status: DC | PRN
  Administered 2018-07-30: 10 mg via INTRAVENOUS

## 2018-07-30 MED ORDER — ONDANSETRON HCL 4 MG/2ML IJ SOLN
INTRAMUSCULAR | Status: DC | PRN
  Administered 2018-07-30: 4 mg via INTRAVENOUS

## 2018-07-30 MED ORDER — CHLORHEXIDINE GLUCONATE CLOTH 2 % EX PADS: 6.0000 | MEDICATED_PAD | Freq: Once | CUTANEOUS | Status: DC

## 2018-07-30 MED ORDER — METOPROLOL SUCCINATE ER 25 MG PO TB24
25.0000 mg | ORAL_TABLET | Freq: Every day | ORAL | Status: DC
  Filled 2018-07-30: qty 1

## 2018-07-30 MED ORDER — ALPRAZOLAM 0.25 MG PO TABS: 0.2500 mg | ORAL_TABLET | Freq: Every day | ORAL | Status: DC | PRN

## 2018-07-30 MED ORDER — KETOROLAC TROMETHAMINE 30 MG/ML IJ SOLN
INTRAMUSCULAR | Status: DC | PRN
  Administered 2018-07-30: 30 mg via INTRAVENOUS

## 2018-07-30 MED ORDER — OXYCODONE HCL 5 MG PO TABS: 5.0000 mg | ORAL_TABLET | Freq: Once | ORAL | Status: DC | PRN

## 2018-07-30 MED ORDER — ONDANSETRON HCL 4 MG/2ML IJ SOLN
INTRAMUSCULAR | Status: AC
  Filled 2018-07-30: qty 2

## 2018-07-30 MED ORDER — EPHEDRINE SULFATE-NACL 50-0.9 MG/10ML-% IV SOSY
PREFILLED_SYRINGE | INTRAVENOUS | Status: DC | PRN
  Administered 2018-07-30: 15 mg via INTRAVENOUS

## 2018-07-30 MED ORDER — DEXAMETHASONE SODIUM PHOSPHATE 10 MG/ML IJ SOLN
INTRAMUSCULAR | Status: AC
  Filled 2018-07-30: qty 1

## 2018-07-30 MED ORDER — LIDOCAINE 2% (20 MG/ML) 5 ML SYRINGE
INTRAMUSCULAR | Status: AC
  Filled 2018-07-30: qty 5

## 2018-07-30 MED ORDER — CITALOPRAM HYDROBROMIDE 20 MG PO TABS
10.0000 mg | ORAL_TABLET | Freq: Every day | ORAL | Status: DC
  Administered 2018-07-31: 10 mg via ORAL
  Filled 2018-07-30: qty 1

## 2018-07-30 MED ORDER — LACTATED RINGERS IV SOLN
INTRAVENOUS | Status: DC
  Administered 2018-07-30: 07:00:00 via INTRAVENOUS

## 2018-07-30 MED ORDER — ACETAMINOPHEN 325 MG PO TABS: 650.0000 mg | ORAL_TABLET | Freq: Four times a day (QID) | ORAL | Status: DC | PRN

## 2018-07-30 MED ORDER — SUGAMMADEX SODIUM 200 MG/2ML IV SOLN
INTRAVENOUS | Status: DC | PRN
  Administered 2018-07-30: 200 mg via INTRAVENOUS

## 2018-07-30 MED ORDER — PROPOFOL 10 MG/ML IV BOLUS
INTRAVENOUS | Status: DC | PRN
  Administered 2018-07-30: 120 mg via INTRAVENOUS

## 2018-07-30 MED ORDER — ACETAMINOPHEN 10 MG/ML IV SOLN: 1000.0000 mg | Freq: Once | INTRAVENOUS | Status: DC | PRN

## 2018-07-30 MED ORDER — ROCURONIUM BROMIDE 10 MG/ML (PF) SYRINGE
PREFILLED_SYRINGE | INTRAVENOUS | Status: AC
  Filled 2018-07-30: qty 10

## 2018-07-30 MED ORDER — FENTANYL CITRATE (PF) 250 MCG/5ML IJ SOLN
INTRAMUSCULAR | Status: AC
  Filled 2018-07-30: qty 5

## 2018-07-30 MED ORDER — PHENYLEPHRINE HCL 10 MG/ML IJ SOLN
INTRAMUSCULAR | Status: DC | PRN
  Administered 2018-07-30: 40 ug via INTRAVENOUS

## 2018-07-30 MED ORDER — LIP MEDEX EX OINT
TOPICAL_OINTMENT | CUTANEOUS | Status: AC
  Administered 2018-07-30: 13:00:00
  Filled 2018-07-30: qty 7

## 2018-07-30 MED ORDER — PHENYLEPHRINE 40 MCG/ML (10ML) SYRINGE FOR IV PUSH (FOR BLOOD PRESSURE SUPPORT)
PREFILLED_SYRINGE | INTRAVENOUS | Status: AC
  Filled 2018-07-30: qty 10

## 2018-07-30 MED ORDER — PROPOFOL 10 MG/ML IV BOLUS
INTRAVENOUS | Status: AC
  Filled 2018-07-30: qty 20

## 2018-07-30 MED ORDER — TRAMADOL HCL 50 MG PO TABS: 50.0000 mg | ORAL_TABLET | Freq: Four times a day (QID) | ORAL | Status: DC | PRN

## 2018-07-30 MED ORDER — SODIUM CHLORIDE 0.9 % IR SOLN
Status: DC | PRN
  Administered 2018-07-30: 1000 mL

## 2018-07-30 MED ORDER — MIDAZOLAM HCL 2 MG/2ML IJ SOLN
INTRAMUSCULAR | Status: AC
  Filled 2018-07-30: qty 2

## 2018-07-30 MED ORDER — ONDANSETRON HCL 4 MG/2ML IJ SOLN: 4.0000 mg | Freq: Four times a day (QID) | INTRAMUSCULAR | Status: DC | PRN

## 2018-07-30 MED ORDER — SUGAMMADEX SODIUM 200 MG/2ML IV SOLN
INTRAVENOUS | Status: AC
  Filled 2018-07-30: qty 2

## 2018-07-30 MED ORDER — ACETAMINOPHEN 160 MG/5ML PO SOLN: 1000.0000 mg | Freq: Once | ORAL | Status: DC | PRN

## 2018-07-30 MED ORDER — ACETAMINOPHEN 650 MG RE SUPP: 650.0000 mg | Freq: Four times a day (QID) | RECTAL | Status: DC | PRN

## 2018-07-30 MED ORDER — MIDAZOLAM HCL 5 MG/5ML IJ SOLN
INTRAMUSCULAR | Status: DC | PRN
  Administered 2018-07-30: 2 mg via INTRAVENOUS

## 2018-07-30 MED ORDER — OXYCODONE HCL 5 MG/5ML PO SOLN
5.0000 mg | Freq: Once | ORAL | Status: DC | PRN
  Filled 2018-07-30: qty 5

## 2018-07-30 MED ORDER — FENTANYL CITRATE (PF) 100 MCG/2ML IJ SOLN: 25.0000 ug | INTRAMUSCULAR | Status: DC | PRN

## 2018-07-30 MED ORDER — EPHEDRINE 5 MG/ML INJ
INTRAVENOUS | Status: AC
  Filled 2018-07-30: qty 10

## 2018-07-30 MED ORDER — ACETAMINOPHEN 500 MG PO TABS
ORAL_TABLET | ORAL | Status: AC
  Filled 2018-07-30: qty 2

## 2018-07-30 MED ORDER — ACETAMINOPHEN 500 MG PO TABS
1000.0000 mg | ORAL_TABLET | Freq: Once | ORAL | Status: AC
  Administered 2018-07-30: 1000 mg via ORAL

## 2018-07-30 SURGICAL SUPPLY — 28 items
ATTRACTOMAT 16X20 MAGNETIC DRP (DRAPES) ×2 IMPLANT
BLADE SURG 15 STRL LF DISP TIS (BLADE) ×1 IMPLANT
BLADE SURG 15 STRL SS (BLADE) ×1
CHLORAPREP W/TINT 26ML (MISCELLANEOUS) ×2 IMPLANT
CLIP VESOCCLUDE MED 6/CT (CLIP) ×4 IMPLANT
CLIP VESOCCLUDE SM WIDE 6/CT (CLIP) ×4 IMPLANT
COVER SURGICAL LIGHT HANDLE (MISCELLANEOUS) ×2 IMPLANT
COVER WAND RF STERILE (DRAPES) ×2 IMPLANT
DERMABOND ADVANCED (GAUZE/BANDAGES/DRESSINGS)
DERMABOND ADVANCED .7 DNX12 (GAUZE/BANDAGES/DRESSINGS) IMPLANT
DRAPE LAPAROTOMY T 98X78 PEDS (DRAPES) ×2 IMPLANT
ELECT PENCIL ROCKER SW 15FT (MISCELLANEOUS) ×2 IMPLANT
ELECT REM PT RETURN 15FT ADLT (MISCELLANEOUS) ×2 IMPLANT
GAUZE 4X4 16PLY RFD (DISPOSABLE) ×2 IMPLANT
GLOVE SURG ORTHO 8.0 STRL STRW (GLOVE) ×2 IMPLANT
GOWN STRL REUS W/TWL XL LVL3 (GOWN DISPOSABLE) ×6 IMPLANT
HEMOSTAT SURGICEL 2X4 FIBR (HEMOSTASIS) IMPLANT
ILLUMINATOR WAVEGUIDE N/F (MISCELLANEOUS) IMPLANT
KIT BASIN OR (CUSTOM PROCEDURE TRAY) ×2 IMPLANT
NDL HYPO 25X1 1.5 SAFETY (NEEDLE) ×1 IMPLANT
NEEDLE HYPO 25X1 1.5 SAFETY (NEEDLE) ×2 IMPLANT
PACK BASIC VI WITH GOWN DISP (CUSTOM PROCEDURE TRAY) ×2 IMPLANT
SUT MNCRL AB 4-0 PS2 18 (SUTURE) ×2 IMPLANT
SUT VIC AB 3-0 SH 18 (SUTURE) ×3 IMPLANT
SYR BULB IRRIGATION 50ML (SYRINGE) ×2 IMPLANT
SYR CONTROL 10ML LL (SYRINGE) ×2 IMPLANT
TOWEL OR 17X26 10 PK STRL BLUE (TOWEL DISPOSABLE) ×2 IMPLANT
TOWEL OR NON WOVEN STRL DISP B (DISPOSABLE) ×2 IMPLANT

## 2018-07-30 NOTE — Transfer of Care (Signed)
Immediate Anesthesia Transfer of Care Note  Patient: Asjia Berrios  Procedure(s) Performed: PARATHYROIDECTOMY WITH NECK EXPLORATION (N/A )  Patient Location: PACU  Anesthesia Type:General  Level of Consciousness: sedated  Airway & Oxygen Therapy: Patient Spontanous Breathing and Patient connected to face mask oxygen  Post-op Assessment: Report given to RN and Post -op Vital signs reviewed and stable  Post vital signs: Reviewed and stable  Last Vitals:  Vitals Value Taken Time  BP 112/59 07/30/2018 10:15 AM  Temp    Pulse 55 07/30/2018 10:17 AM  Resp 11 07/30/2018 10:17 AM  SpO2 92 % 07/30/2018 10:17 AM  Vitals shown include unvalidated device data.  Last Pain:  Vitals:   07/30/18 0700  TempSrc:   PainSc: 0-No pain      Patients Stated Pain Goal: 4 (26/33/35 4562)  Complications: No apparent anesthesia complications

## 2018-07-30 NOTE — Interval H&P Note (Signed)
History and Physical Interval Note:  07/30/2018 8:21 AM  Gloria Lambert  has presented today for surgery, with the diagnosis of PRIMARY HYPERPARATHYROIDISM  The various methods of treatment have been discussed with the patient and family. After consideration of risks, benefits and other options for treatment, the patient has consented to  Procedure(s): PARATHYROIDECTOMY WITH NECK EXPLORATION (N/A) as a surgical intervention .  The patient's history has been reviewed, patient examined, no change in status, stable for surgery.  I have reviewed the patient's chart and labs.  Questions were answered to the patient's satisfaction.     Armandina Gemma

## 2018-07-30 NOTE — Anesthesia Procedure Notes (Signed)
Procedure Name: Intubation Date/Time: 07/30/2018 8:47 AM Performed by: Talbot Grumbling, CRNA Pre-anesthesia Checklist: Patient identified, Emergency Drugs available, Suction available and Patient being monitored Patient Re-evaluated:Patient Re-evaluated prior to induction Oxygen Delivery Method: Circle system utilized Preoxygenation: Pre-oxygenation with 100% oxygen Induction Type: IV induction Ventilation: Mask ventilation without difficulty Laryngoscope Size: Mac and 3 Grade View: Grade II Tube type: Oral Tube size: 7.0 mm Number of attempts: 1 Airway Equipment and Method: Stylet Placement Confirmation: ETT inserted through vocal cords under direct vision,  positive ETCO2 and breath sounds checked- equal and bilateral Secured at: 22 cm Tube secured with: Tape Dental Injury: Teeth and Oropharynx as per pre-operative assessment

## 2018-07-30 NOTE — Op Note (Signed)
Operative Note  Pre-operative Diagnosis: Primary hyperparathyroidism  Post-operative Diagnosis: Same  Surgeon:  Armandina Gemma, MD  Assistant: None   Procedure: Neck exploration, left superior parathyroidectomy, left inferior parathyroidectomy, biopsy of right superior and inferior parathyroid glands, excision of pyramidal lobe of thyroid  Anesthesia: General  Estimated Blood Loss: Minimal  Drains: None         Specimen: Left superior parathyroid, left inferior parathyroid, biopsy of right superior and inferior parathyroid glands, pyramidal lobe of thyroid  Indications:  Patient is referred by Dr. Dagmar Hait for surgical evaluation and management of primary hyperparathyroidism. Patient was originally diagnosed in [place] over 4 years ago. She was noted to have hypercalcemia. She underwent evaluation including a nuclear medicine parathyroid scan which was performed in April 2016. This showed a left superior parathyroid adenoma and suggested a right inferior parathyroid adenoma. Calcium levels have ranged from 10.4-10.9. PTH levels have been high normal to slightly high. 24-hour urine collection for calcium was elevated at 261. Bone density scanning did show evidence of osteoporosis. Patient denies nephrolithiasis. She denies fatigue. She has had some memory issues. Patient also underwent an ultrasound examination of the neck on February 16, 2018. This showed bilateral thyroid nodules. Also noted were nodules outside of the thyroid gland which were suspicious for parathyroid adenomas. Patient is now referred to surgery for consideration for neck exploration and parathyroidectomy for management of primary hyperparathyroidism.   Procedure Details:  The patient was seen in the pre-op holding area. The risks, benefits, complications, treatment options, and expected outcomes were previously discussed with the patient. The patient agreed with the proposed plan and has signed the informed  consent form.  The patient was brought to the operating room by the surgical team, identified as Gloria Lambert and the procedure verified. A "time out" was completed and the above information confirmed.  Following induction of general anesthesia, the patient was positioned and then prepped and draped in the usual aseptic fashion.  After ascertaining that an adequate level of anesthesia been achieved, a Kocher incision is made with a #15 blade.  Dissection is carried down through subcutaneous tissues and platysma.  Skin flaps are elevated cephalad and caudad.  A Mahorner self-retaining retractors placed for exposure.  Strap muscles are incised in the midline and dissection is begun on the left side.  Left thyroid lobe is normal in size.  It is gently mobilized.  There is an enlarged left superior parathyroid gland located posterior to the upper pole.  This is dissected out.  Vascular pedicle is divided between ligaclips and the gland is excised.  Gland is submitted to pathology where Dr. Claudette Laws confirmed parathyroid tissue consistent with parathyroid adenoma.  Further dissection on the left side reveals an enlarged left inferior parathyroid gland which also has the appearance of an adenoma.  It is dissected down to its vascular pedicle but left in situ for the time being.  Dry pack is placed in the left neck.  Next the strap muscles are reflected to the right exposing a normal-sized right thyroid lobe.  There is a prominent pyramidal lobe just to the right of midline.  This is resected with the electrocautery and submitted to pathology for permanent evaluation only.  Exploration posterior to the right lobe of the thyroid reveals what appears to be a normal right superior parathyroid gland and a normal right inferior parathyroid gland.  Using medium ligaclips and a #15 blade, biopsy of each gland is performed.  Dr. Claudette Laws confirmed  parathyroid tissue at each location, both of which appear to be  normocellular.  Dry pack is placed in the right neck.  Next we return to the left side of the neck and proceeded with resection of the left inferior parathyroid by dissecting out the vascular pedicle and dividing it between ligaclips.  The left inferior gland is submitted for permanent section only.  Neck is irrigated with warm saline and all packs are removed.  Good hemostasis is noted.  Fibrillar is placed throughout the operative field.  Strap muscles are reapproximated in the midline of interrupted 3-0 Vicryl sutures.  Platysma was closed with interrupted 3-0 Vicryl sutures.  Skin is anesthetized with local anesthetic.  Skin edges are reapproximated with a running 4-0 Monocryl subcuticular suture.  Wound is washed and dried and Dermabond is applied as dressing.  Patient is awakened from anesthesia and transported to the recovery room in stable condition.  The patient tolerated the procedure well.   Armandina Gemma, MD [facility] Office: [phone]

## 2018-07-31 ENCOUNTER — Encounter (HOSPITAL_COMMUNITY): Payer: Self-pay | Admitting: Surgery

## 2018-07-31 DIAGNOSIS — Z79899 Other long term (current) drug therapy: Secondary | ICD-10-CM | POA: Diagnosis not present

## 2018-07-31 DIAGNOSIS — Z7901 Long term (current) use of anticoagulants: Secondary | ICD-10-CM | POA: Diagnosis not present

## 2018-07-31 DIAGNOSIS — D351 Benign neoplasm of parathyroid gland: Secondary | ICD-10-CM | POA: Diagnosis not present

## 2018-07-31 DIAGNOSIS — E210 Primary hyperparathyroidism: Secondary | ICD-10-CM | POA: Diagnosis not present

## 2018-07-31 DIAGNOSIS — E7800 Pure hypercholesterolemia, unspecified: Secondary | ICD-10-CM | POA: Diagnosis not present

## 2018-07-31 DIAGNOSIS — E042 Nontoxic multinodular goiter: Secondary | ICD-10-CM | POA: Diagnosis not present

## 2018-07-31 LAB — BASIC METABOLIC PANEL
Anion gap: 7 (ref 5–15)
BUN: 25 mg/dL — AB (ref 8–23)
CO2: 25 mmol/L (ref 22–32)
Calcium: 8.9 mg/dL (ref 8.9–10.3)
Chloride: 108 mmol/L (ref 98–111)
Creatinine, Ser: 0.71 mg/dL (ref 0.44–1.00)
GFR calc Af Amer: >60 mL/min (ref >60)
GFR calc non Af Amer: >60 mL/min (ref >60)
Glucose, Bld: 143 mg/dL — ABNORMAL HIGH (ref 70–99)
Potassium: 5.1 mmol/L (ref 3.5–5.1)
Sodium: 140 mmol/L (ref 135–145)

## 2018-07-31 MED ORDER — DEXTROSE-NACL 5-0.45 % IV SOLN
INTRAVENOUS | Status: DC
  Administered 2018-07-31: 12:00:00 via INTRAVENOUS

## 2018-07-31 MED ORDER — TRAMADOL HCL 50 MG PO TABS: 50.0000 mg | ORAL_TABLET | Freq: Four times a day (QID) | ORAL | 0 refills | Status: DC | PRN

## 2018-07-31 NOTE — Discharge Summary (Signed)
Physician Discharge Summary [facility]  Patient ID: Gloria Lambert MRN: [id number] DOB/AGE: [id number] y.o.  Admit date: 07/30/2018 Discharge date: 07/31/2018  Admission Diagnoses:  primary hyperparathyroidism  Discharge Diagnoses:  Principal Problem:   Hyperparathyroidism [facility]) Active Problems:   Primary hyperparathyroidism [facility])   Discharged Condition: good  Hospital Course: Patient was admitted for observation following parathyroid surgery.  Post op course was uncomplicated.  Pain was well controlled.  Tolerated diet.  Post op calcium level on morning following surgery was 8.9 mg/dl.  Patient was prepared for discharge home on POD#1.  Consults: None  Treatments: surgery: neck exploration and parathyroidectomy  Discharge Exam: Blood pressure 111/75, pulse 65, temperature 98.8 F (37.1 C), temperature source Oral, resp. rate 17, height 5\' 6"  (1.676 m), weight 90.8 kg, SpO2 95 %. HEENT - clear Neck - wound dry and intact; minimal STS; voice normal Chest - clear bilaterally Cor - RRR   Disposition: Home  Discharge Instructions    Diet - low sodium heart healthy   Complete by:  As directed    Discharge instructions   Complete by:  As directed    [facility]  THYROID & PARATHYROID SURGERY:  POST-OP INSTRUCTIONS  Always review your discharge instruction sheet from the facility where your surgery was performed.  A prescription for pain medication may be given to you upon discharge.  Take your pain medication as prescribed.  If narcotic pain medicine is not needed, then you may take acetaminophen (Tylenol) or ibuprofen (Advil) as needed.  Take your usually prescribed medications unless otherwise directed.  If you need a refill on your pain medication, please contact our office during regular business hours.  Prescriptions cannot be processed by our office after 5 pm or on weekends.  Start with a light diet upon arrival home,  such as soup and crackers or toast.  Be sure to drink plenty of fluids daily.  Resume your normal diet the day after surgery.  Most patients will experience some swelling and bruising on the chest and neck area.  Ice packs will help.  Swelling and bruising can take several days to resolve.   It is common to experience some constipation after surgery.  Increasing fluid intake and taking a stool softener (Colace) will usually help or prevent this problem.  A mild laxative (Milk of Magnesia or Miralax) should be taken according to package directions if there has been no bowel movement after 48 hours.  You have steri-strips and a gauze dressing over your incision.  You may remove the gauze bandage on the second day after surgery, and you may shower at that time.  Leave your steri-strips (small skin tapes) in place directly over the incision.  These strips should remain on the skin for 5-7 days and then be removed.  You may get them wet in the shower and pat them dry.  You may resume regular (light) daily activities beginning the next day (such as daily self-care, walking, climbing stairs) gradually increasing activities as tolerated.  You may have sexual intercourse when it is comfortable.  Refrain from any heavy lifting or straining until approved by your doctor.  You may drive when you no longer are taking prescription pain medication, you can comfortably wear a seatbelt, and you can safely maneuver your car and apply brakes.  You should see your doctor in the office for a follow-up appointment approximately three weeks after your surgery.  Make sure that you call for  this appointment within a day or two after you arrive home to insure a convenient appointment time.  WHEN TO CALL YOUR DOCTOR: -- Fever greater than 101.5 -- Inability to urinate -- Nausea and/or vomiting - persistent -- Extreme swelling or bruising -- Continued bleeding from incision -- Increased pain, redness, or drainage from the  incision -- Difficulty swallowing or breathing -- Muscle cramping or spasms -- Numbness or tingling in hands or around lips  The clinic staff is available to answer your questions during regular business hours.  Please don't hesitate to call and ask to speak to one of the nurses if you have concerns.  Armandina Gemma, MD [facility] Office: [phone]   Increase activity slowly   Complete by:  As directed    No dressing needed   Complete by:  As directed      Allergies as of 07/31/2018      Reactions   Latex Rash   Penicillin G Rash, Other (See Comments)   DID THE REACTION INVOLVE: Swelling of the face/tongue/throat, SOB, or low BP? Unknown Sudden or severe rash/hives, skin peeling, or the inside of the mouth or nose? Unknown Did it require medical treatment? Unknown When did it last happen? Years ago If all above answers are "NO", may proceed with cephalosporin use.      Medication List    TAKE these medications   ALPRAZolam 0.25 MG tablet Commonly known as:  XANAX Take 0.25 mg by mouth daily as needed for anxiety.   citalopram 10 MG tablet Commonly known as:  CELEXA TAKE 1 TABLET BY MOUTH EVERY DAY   FISH OIL PO Take 1 capsule by mouth daily.   GAVISCON 80-14.2 MG Chew Generic drug:  Alum Hydroxide-Mag Trisilicate Chew 1 each by mouth daily as needed (for acid reflux).   metoprolol succinate 25 MG 24 hr tablet Commonly known as:  TOPROL XL Take 1 tablet (25 mg total) by mouth daily.   metoprolol tartrate 50 MG tablet Commonly known as:  LOPRESSOR Take Metoprolol, 50mg , one tablet, one hour prior to your coronary CT exam.   MULTIVITAMIN PO Take 1 tablet by mouth daily.   PROBIOTIC-10 PO Take 1 capsule by mouth daily.   ranitidine 300 MG tablet Commonly known as:  ZANTAC Take 300 mg by mouth daily.   rosuvastatin 10 MG tablet Commonly known as:  CRESTOR Take 1 tablet (10 mg total) by mouth daily.   traMADol 50 MG tablet Commonly  known as:  ULTRAM Take 1-2 tablets (50-100 mg total) by mouth every 6 (six) hours as needed.   VITAMIN D PO Take by mouth.   XARELTO 10 MG Tabs tablet Generic drug:  rivaroxaban TAKE 1 TABLET BY MOUTH DAILY WITH SUPPER What changed:  See the new instructions.        Earnstine Regal, MD, [facility] Office: [phone]   Signed: Armandina Gemma 07/31/2018, 1:50 PM

## 2018-07-31 NOTE — Progress Notes (Signed)
Pt stable for discharge. VSS, discharge paperwork, AVS, and f/u appts. Reviewed with pt. Pt confirmed learning with teachback method. PIV removed without complication. Pt escorted off of unit to car and care of husband.

## 2018-08-10 DIAGNOSIS — E210 Primary hyperparathyroidism: Secondary | ICD-10-CM | POA: Diagnosis not present

## 2018-08-10 NOTE — Anesthesia Postprocedure Evaluation (Signed)
Anesthesia Post Note  Patient: Danett Palazzo  Procedure(s) Performed: PARATHYROIDECTOMY WITH NECK EXPLORATION (N/A )     Patient location during evaluation: PACU Anesthesia Type: General Level of consciousness: awake and alert Pain management: pain level controlled Vital Signs Assessment: post-procedure vital signs reviewed and stable Respiratory status: spontaneous breathing, nonlabored ventilation, respiratory function stable and patient connected to nasal cannula oxygen Cardiovascular status: blood pressure returned to baseline and stable Postop Assessment: no apparent nausea or vomiting Anesthetic complications: no    Last Vitals:  Vitals:   07/31/18 0122 07/31/18 0539  BP: (!) 118/58 111/75  Pulse: 72 65  Resp: 15 17  Temp: 37.1 C 37.1 C  SpO2: 95% 95%    Last Pain:  Vitals:   07/31/18 1234  TempSrc:   PainSc: 2                  Lizbet Cirrincione

## 2018-08-25 DIAGNOSIS — E559 Vitamin D deficiency, unspecified: Secondary | ICD-10-CM | POA: Diagnosis not present

## 2018-08-25 DIAGNOSIS — E042 Nontoxic multinodular goiter: Secondary | ICD-10-CM | POA: Diagnosis not present

## 2018-08-25 DIAGNOSIS — K219 Gastro-esophageal reflux disease without esophagitis: Secondary | ICD-10-CM | POA: Diagnosis not present

## 2018-08-25 DIAGNOSIS — Z86718 Personal history of other venous thrombosis and embolism: Secondary | ICD-10-CM | POA: Diagnosis not present

## 2018-08-25 DIAGNOSIS — M810 Age-related osteoporosis without current pathological fracture: Secondary | ICD-10-CM | POA: Diagnosis not present

## 2018-08-25 DIAGNOSIS — Z8639 Personal history of other endocrine, nutritional and metabolic disease: Secondary | ICD-10-CM | POA: Diagnosis not present

## 2018-09-02 ENCOUNTER — Encounter: Payer: Self-pay | Admitting: Physician Assistant

## 2018-09-02 ENCOUNTER — Ambulatory Visit (INDEPENDENT_AMBULATORY_CARE_PROVIDER_SITE_OTHER): Payer: Medicare Other | Admitting: Physician Assistant

## 2018-09-02 VITALS — BP 120/62 | HR 62 | Ht 66.0 in | Wt 205.1 lb

## 2018-09-02 DIAGNOSIS — I2510 Atherosclerotic heart disease of native coronary artery without angina pectoris: Secondary | ICD-10-CM | POA: Diagnosis not present

## 2018-09-02 DIAGNOSIS — I420 Dilated cardiomyopathy: Secondary | ICD-10-CM

## 2018-09-02 MED ORDER — LISINOPRIL 5 MG PO TABS: 2.5000 mg | ORAL_TABLET | Freq: Every day | ORAL | 3 refills | Status: DC

## 2018-09-02 NOTE — Progress Notes (Signed)
Cardiology Office Note:    Date:  09/02/2018   ID:  Gloria Lambert, DOB 05/12/1946, MRN [id number]  PCP:  Gloria Major, MD  Cardiologist:  Gloria Mocha, MD  / Gloria Dopp, PA-C  Electrophysiologist:  None  Endocrinologist: Gloria Brine, MD Hematologist: Gloria Gauze, MD  Referring MD: Gloria Major, MD   Chief Complaint  Patient presents with  . Follow-up    cardiomyopathy     History of Present Illness:    Gloria Lambert is a 73 y.o. female with coronary calcification on CT, mild cardiomyopathy,prior DVT, Factor V Leiden deficiency, chronicanticoagulationwith Rivaroxaban,parathyroid adenoma withhyperparathyroidism, GERD, depression.  She was evaluated in 03/2018 for a heart murmur.  An echocardiogram demonstrated no significant valve disease but her EF was 45-50.  A coronary CTA was arranged and demonstrated LAD plaque but no significant stenosis and an elevated calcium score.  This was consistent with a non-ischemic cardiomyopathy.  She was last seen in November 2019.  Beta-blocker therapy was added.  Since that time, she underwent parathyroidectomy in January 2020.   Gloria Lambert returns for follow up.  She has not had chest pain, shortness of breath, syncope.  She does have chronic leg swelling.  She is tolerating her medications.    Prior CV studies:   The following studies were reviewed today:  Coronary CTA 05/01/18 FINDINGS: Non-cardiac: See separate report from [facility]. The pulmonary veins drained normally to the left atrium. Calcium Score: 12 Agatston units. Coronary Arteries: Right dominant with no anomalies LM: No plaque or stenosis. LAD system: Calcified plaque in the mid LAD, no significant stenosis. Circumflex system: No plaque or stenosis. RCA system: No plaque or stenosis. IMPRESSION: 1. Coronary artery calcium score 12 Agatston units, this places the patient in the 49th percentile for age and gender, suggesting intermediate risk for  future cardiac events. 2. No obstructive coronary artery disease noted.  Echo 04/10/18 Mod LVH, EF 45-50, ant-lat HK, Gr 1 DD, trivial MR  Past Medical History:  Diagnosis Date  . Baker's cyst of knee, right   . 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 Coronary artery calcification seen on CT scan    Coronary CTA 10/19:  Ca score 12 (49th percentile - intermediate risk); mid LAD calcified plaque; no obstructive CAD; Small to mod hiatal hernia  . Depression   . Dilated cardiomyopathy ([place]) 05/19/2018   Echo 04/10/18 - Mod LVH, EF 45-50, ant-lat HK, Gr 1 DD, trivial MR  . Diverticulosis   . Edema   . GERD (gastroesophageal reflux disease)   . Heart murmur 03/2018  . Hemorrhoids   . Heterozygous factor V Leiden mutation ([place]) 11/06/2015  . History of colon polyps   . History of echocardiogram    Echo 9/19: mod LVH, EF 45-50, ant-lat HK, Gr 1 DD, trivial MR  . History of hiatal hernia 05/01/2018   Noted on CT  . HLD (hyperlipidemia)   . Hyperparathyroidism ([place])    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 - surgery not recommended  . OA (osteoarthritis)   . Obese   . Osteopenia   . Sleep apnea    does not use CPAP because of fit  . Stress incontinence    Surgical Hx: The patient  has a past surgical history that includes Hip Arthroplasty (Right); Vaginal hysterectomy (1998); Bilateral oophorectomy (1998); Bladder suspension (1998); arthroscopic knee surgery (Right); Tonsillectomy; Colonoscopy; and Parathyroidectomy (N/A, 07/30/2018).   Current Medications: Current Meds  Medication  Sig  . ALPRAZolam (XANAX) 0.25 MG tablet Take 0.25 mg by mouth daily as needed for anxiety.   . citalopram (CELEXA) 10 MG tablet TAKE 1 TABLET BY MOUTH EVERY DAY  . metoprolol succinate (TOPROL XL) 25 MG 24 hr tablet Take 1 tablet (25 mg total) by mouth daily.  . Multiple Vitamins-Minerals (MULTIVITAMIN PO) Take 1 tablet by mouth daily.  . Omega-3 Fatty Acids (FISH OIL  PO) Take 1 capsule by mouth daily.   . Probiotic Product (PROBIOTIC-10 PO) Take 1 capsule by mouth daily.  . ranitidine (ZANTAC) 300 MG tablet Take 300 mg by mouth daily.   . rosuvastatin (CRESTOR) 10 MG tablet Take 1 tablet (10 mg total) by mouth daily.  Marland Kitchen VITAMIN D PO Take 1 tablet by mouth daily.   Alveda Reasons 10 MG TABS tablet TAKE 1 TABLET BY MOUTH DAILY WITH SUPPER     Allergies:   Latex and Penicillin g   Social History   Tobacco Use  . Smoking status: Never Smoker  . Smokeless tobacco: Never Used  Substance Use Topics  . Alcohol use: Not Currently    Alcohol/week: 0.0 standard drinks    Comment: socaily  . Drug use: No     Family Hx: The patient's family history includes Breast cancer (age of onset: 54) in her mother; Cancer in her father; Heart attack in her paternal grandmother; Heart murmur in her sister.  ROS:   Please see the history of present illness.    ROS All other systems reviewed and are negative.   EKGs/Labs/Other Test Reviewed:    EKG:  EKG is   ordered today.  The ekg ordered today demonstrates normal sinus rhythm, HR 62, normal axis, QTc 403  Recent Labs: 06/16/2018: ALT 16 07/27/2018: Hemoglobin 13.1; Platelets 150 07/31/2018: BUN 25; Creatinine, Ser 0.71; Potassium 5.1; Sodium 140   Recent Lipid Panel Lab Results  Component Value Date/Time   CHOL 136 06/16/2018 09:54 AM   TRIG 115 06/16/2018 09:54 AM   HDL 45 06/16/2018 09:54 AM   CHOLHDL 3.0 06/16/2018 09:54 AM   LDLCALC 68 06/16/2018 09:54 AM    Physical Exam:    VS:  BP 120/62   Pulse 62   Ht 5\' 6"  (1.676 m)   Wt 205 lb 1.9 oz (93 kg)   SpO2 97%   BMI 33.11 kg/m     Wt Readings from Last 3 Encounters:  09/02/18 205 lb 1.9 oz (93 kg)  07/30/18 200 lb 4 oz (90.8 kg)  07/27/18 200 lb 4 oz (90.8 kg)     Physical Exam  Constitutional: She is oriented to person, place, and time. She appears well-developed and well-nourished. No distress.  HENT:  Head: Normocephalic and atraumatic.    Neck: Neck supple. No JVD present.  Cardiovascular: Normal rate, regular rhythm, S1 normal, S2 normal and normal heart sounds.  No murmur heard. Pulmonary/Chest: Effort normal. She has no rales.  Abdominal: Soft. There is no hepatomegaly.  Musculoskeletal:        General: Edema (trace bilat LE edema) present.  Neurological: She is alert and oriented to person, place, and time.  Skin: Skin is warm and dry.    ASSESSMENT & PLAN:    Dilated cardiomyopathy ([place]) NICM.  EF 45-50.  NYHA 2.  Volume status is normal.  Continue Metoprolol.  Start Lisinopril 2.5 mg QD.  BMET 2 weeks.  We discussed the potential side effect of cough.  FU with Dr. Burt Knack or me in 6 mos.  Coronary artery calcification seen on CT scan No significant obstruction on recent CTA.  She is not on ASA as she is on Xarelto.  She is not having angina.  Continue statin.   Dispo:  Return in about 6 months (around 03/03/2019) for Routine Follow Up, w/ Dr. Burt Knack, or Gloria Dopp, PA-C.   Medication Adjustments/Labs and Tests Ordered: Current medicines are reviewed at length with the patient today.  Concerns regarding medicines are outlined above.  Tests Ordered: Orders Placed This Encounter  Procedures  . Basic metabolic panel  . EKG 12-Lead   Medication Changes: Meds ordered this encounter  Medications  . lisinopril (PRINIVIL,ZESTRIL) 5 MG tablet    Sig: Take 0.5 tablets (2.5 mg total) by mouth daily.    Dispense:  45 tablet    Refill:  3    Signed, Gloria Dopp, PA-C  09/02/2018 3:51 PM    [facility] [place], [place], [place]  [address] Phone: [phone]; Fax: [phone]

## 2018-09-02 NOTE — Patient Instructions (Signed)
Medication Instructions:  Your physician has recommended you make the following change in your medication:  1. START LISINOPRIL 2.5 MG (1/2 TABLET) DAILY.   If you need a refill on your cardiac medications before your next appointment, please call your pharmacy.   Lab work: TO BE DONE IN 2 weeks: BMET   If you have labs (blood work) drawn today and your tests are completely normal, you will receive your results only by: Marland Kitchen MyChart Message (if you have MyChart) OR . A paper copy in the mail If you have any lab test that is abnormal or we need to change your treatment, we will call you to review the results.  Testing/Procedures: NONE  Follow-Up: At [facility], you and your health needs are our priority.  As part of our continuing mission to provide you with exceptional heart care, we have created designated Provider Care Teams.  These Care Teams include your primary Cardiologist (physician) and Advanced Practice Providers (APPs -  Physician Assistants and Nurse Practitioners) who all work together to provide you with the care you need, when you need it. . You will need a follow up appointment in:  6 months.  Please call our office 2 months in advance to schedule this appointment.  You may see Sherren Mocha, MD or Richardson Dopp, PA-C   Any Other Special Instructions Will Be Listed Below (If Applicable).

## 2018-09-21 ENCOUNTER — Other Ambulatory Visit: Payer: Medicare Other | Admitting: *Deleted

## 2018-09-21 DIAGNOSIS — Z803 Family history of malignant neoplasm of breast: Secondary | ICD-10-CM | POA: Diagnosis not present

## 2018-09-21 DIAGNOSIS — I420 Dilated cardiomyopathy: Secondary | ICD-10-CM

## 2018-09-21 DIAGNOSIS — Z1231 Encounter for screening mammogram for malignant neoplasm of breast: Secondary | ICD-10-CM | POA: Diagnosis not present

## 2018-09-21 DIAGNOSIS — I2510 Atherosclerotic heart disease of native coronary artery without angina pectoris: Secondary | ICD-10-CM

## 2018-09-23 LAB — BASIC METABOLIC PANEL
BUN / CREAT RATIO: 21 (ref 12–28)
BUN: 16 mg/dL (ref 8–27)
CO2: 18 mmol/L — ABNORMAL LOW (ref 20–29)
Calcium: 9.1 mg/dL (ref 8.7–10.3)
Chloride: 108 mmol/L — ABNORMAL HIGH (ref 96–106)
Creatinine, Ser: 0.76 mg/dL (ref 0.57–1.00)
GFR calc non Af Amer: 80 mL/min/{1.73_m2} (ref >59)
GFR, EST AFRICAN AMERICAN: 92 mL/min/{1.73_m2} (ref >59)
Glucose: 120 mg/dL — ABNORMAL HIGH (ref 65–99)
Potassium: 4.3 mmol/L (ref 3.5–5.2)
Sodium: 147 mmol/L — ABNORMAL HIGH (ref 134–144)

## 2018-10-15 ENCOUNTER — Other Ambulatory Visit: Payer: Self-pay | Admitting: Hematology & Oncology

## 2018-10-15 DIAGNOSIS — I82512 Chronic embolism and thrombosis of left femoral vein: Secondary | ICD-10-CM

## 2018-11-18 ENCOUNTER — Telehealth: Payer: Self-pay | Admitting: Hematology & Oncology

## 2018-11-18 ENCOUNTER — Inpatient Hospital Stay: Payer: Medicare Other | Admitting: Hematology & Oncology

## 2018-11-18 ENCOUNTER — Inpatient Hospital Stay: Payer: Medicare Other

## 2018-11-18 NOTE — Telephone Encounter (Signed)
Returned call to patient she was requesting that 5/8 appts be moved out due to COVID current situation

## 2019-01-01 ENCOUNTER — Other Ambulatory Visit: Payer: Medicare Other

## 2019-01-01 ENCOUNTER — Ambulatory Visit: Payer: Medicare Other | Admitting: Hematology & Oncology

## 2019-02-03 ENCOUNTER — Other Ambulatory Visit: Payer: Self-pay | Admitting: Hematology & Oncology

## 2019-02-03 DIAGNOSIS — I82512 Chronic embolism and thrombosis of left femoral vein: Secondary | ICD-10-CM

## 2019-02-23 DIAGNOSIS — Z8639 Personal history of other endocrine, nutritional and metabolic disease: Secondary | ICD-10-CM | POA: Diagnosis not present

## 2019-02-23 DIAGNOSIS — Z86718 Personal history of other venous thrombosis and embolism: Secondary | ICD-10-CM | POA: Diagnosis not present

## 2019-02-23 DIAGNOSIS — E042 Nontoxic multinodular goiter: Secondary | ICD-10-CM | POA: Diagnosis not present

## 2019-02-23 DIAGNOSIS — M810 Age-related osteoporosis without current pathological fracture: Secondary | ICD-10-CM | POA: Diagnosis not present

## 2019-02-23 DIAGNOSIS — E559 Vitamin D deficiency, unspecified: Secondary | ICD-10-CM | POA: Diagnosis not present

## 2019-02-23 DIAGNOSIS — K219 Gastro-esophageal reflux disease without esophagitis: Secondary | ICD-10-CM | POA: Diagnosis not present

## 2019-03-04 NOTE — Progress Notes (Signed)
Cardiology Office Note:    Date:  03/05/2019   ID:  Gloria Lambert, DOB 08-15-1945, MRN [id number]  PCP:  Gloria Major, MD  Cardiologist:  Gloria Mocha, MD  Electrophysiologist:  None  Endocrinologist: Gloria Brine, MD Hematologist: Gloria Gauze, MD  Referring MD: Gloria Major, MD   Chief Complaint  Patient presents with   Follow-up    Nonischemic cardiomyopathy    History of Present Illness:    Gloria Lambert is a 73 y.o. female with:  Coronary artery disease   Cor Ca score 12 on CTA 10/19  CTA 10/19: LAD plaque; no significant stenosis  Mild cardiomyopathy  Echocardiogram 9/19: EF 45-50  Prior DVT  Factor V Leiden deficiency  Chronic anticoagulation  Parathyroid adenoma with hyperparathyroidism  S/p parathyroidectomy 1.2020   GERD  Depression  Gloria Lambert was last seen in 08/2018.  He returns for follow-up.  She is here alone.  She has been doing well without chest discomfort, significant shortness of breath or syncope.  She does note increasing lower extremity swelling.  This is gradually gotten worse over time.  She has a history of a Baker's cyst in her right leg that was previously drained multiple times.  She wears compression stockings when she can.  Prior CV studies:   The following studies were reviewed today:   Coronary CTA 05/01/18 FINDINGS: Non-cardiac: See separate report from [facility]. The pulmonary veins drained normally to the left atrium. Calcium Score: 12 Agatston units. Coronary Arteries: Right dominant with no anomalies LM: No plaque or stenosis. LAD system: Calcified plaque in the mid LAD, no significant stenosis. Circumflex system: No plaque or stenosis. RCA system: No plaque or stenosis. IMPRESSION: 1. Coronary artery calcium score 12 Agatston units, this places the patient in the 49th percentile for age and gender, suggesting intermediate risk for future cardiac events. 2. No obstructive coronary  artery disease noted.  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) 11/06/2015   Coronary artery calcification seen on CT scan    Coronary CTA 10/19:  Ca score 12 (49th percentile - intermediate risk); mid LAD calcified plaque; no obstructive CAD; Small to mod hiatal hernia   Depression    Dilated cardiomyopathy ([place]) 05/19/2018   Echo 04/10/18 - Mod LVH, EF 45-50, ant-lat HK, Gr 1 DD, trivial MR   Diverticulosis    Edema    GERD (gastroesophageal reflux disease)    Heart murmur 03/2018   Hemorrhoids    Heterozygous factor V Leiden mutation ([place]) 11/06/2015   History of colon polyps    History of echocardiogram    Echo 9/19: mod LVH, EF 45-50, ant-lat HK, Gr 1 DD, trivial MR   History of hiatal hernia 05/01/2018   Noted on CT   HLD (hyperlipidemia)    Hyperparathyroidism ([facility])    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 - surgery not recommended   OA (osteoarthritis)    Obese    Osteopenia    Sleep apnea    does not use CPAP because of fit   Stress incontinence    Surgical Hx: The patient  has a past surgical history that includes Hip Arthroplasty (Right); Vaginal hysterectomy (1998); Bilateral oophorectomy (1998); Bladder suspension (1998); arthroscopic knee surgery (Right); Tonsillectomy; Colonoscopy; and Parathyroidectomy (N/A, 07/30/2018).   Current Medications: Current Meds  Medication Sig   amoxicillin (AMOXIL) 500 MG capsule Take 500 mg by mouth QID.   citalopram (CELEXA) 10 MG tablet TAKE 1 TABLET BY MOUTH EVERY DAY   lisinopril (PRINIVIL,ZESTRIL) 5 MG tablet Take 0.5 tablets (2.5 mg total) by mouth daily.   metoprolol succinate (TOPROL XL) 25 MG 24 hr tablet Take 1 tablet (25 mg total) by mouth daily.   Multiple Vitamins-Minerals (MULTIVITAMIN PO) Take 1 tablet by mouth daily.   Omega-3  Fatty Acids (FISH OIL PO) Take 1 capsule by mouth daily.    Probiotic Product (PROBIOTIC-10 PO) Take 1 capsule by mouth daily.   ranitidine (ZANTAC) 300 MG tablet Take 300 mg by mouth daily.    VITAMIN D PO Take 1 tablet by mouth daily.    XARELTO 10 MG TABS tablet TAKE 1 TABLET BY MOUTH DAILY WITH SUPPER     Allergies:   Latex and Penicillin g   Social History   Tobacco Use   Smoking status: Never Smoker   Smokeless tobacco: Never Used  Substance Use Topics   Alcohol use: Not Currently    Alcohol/week: 0.0 standard drinks    Comment: socaily   Drug use: No     Family Hx: The patient's family history includes Breast cancer (age of onset: 39) in her mother; Cancer in her father; Heart attack in her paternal grandmother; Heart murmur in her sister.  ROS:   Please see the history of present illness.    ROS All other systems reviewed and are negative.   EKGs/Labs/Other Test Reviewed:    EKG:  EKG is  ordered today.  The ekg ordered today demonstrates normal sinus rhythm, heart rate 62, normal axis, QTC 408, no change from prior tracing  Recent Labs: 06/16/2018: ALT 16 07/27/2018: Hemoglobin 13.1; Platelets 150 09/21/2018: BUN 16; Creatinine, Ser 0.76; Potassium 4.3; Sodium 147   Recent Lipid Panel Lab Results  Component Value Date/Time   CHOL 136 06/16/2018 09:54 AM   TRIG 115 06/16/2018 09:54 AM   HDL 45 06/16/2018 09:54 AM   CHOLHDL 3.0 06/16/2018 09:54 AM   LDLCALC 68 06/16/2018 09:54 AM     Physical Exam:    VS:  BP 116/72    Pulse 62    Ht 5\' 6"  (1.676 m)    Wt 210 lb (95.3 kg)    BMI 33.89 kg/m     Wt Readings from Last 3 Encounters:  03/05/19 210 lb (95.3 kg)  09/02/18 205 lb 1.9 oz (93 kg)  07/30/18 200 lb 4 oz (90.8 kg)     Physical Exam  Constitutional: She is oriented to person, place, and time. She appears well-developed and well-nourished. No distress.  HENT:  Head: Normocephalic and atraumatic.  Eyes: No scleral icterus.  Neck: No JVD  present. No thyromegaly present.  Cardiovascular: Normal rate, regular rhythm and normal heart sounds.  No murmur heard. Pulmonary/Chest: Effort normal and breath sounds normal. She has no rales.  Abdominal: Soft. There is no hepatomegaly.  Musculoskeletal:        General: Edema (trace bilat LE edema; R>L; multiple varicose veins noted bilat; chronic changes on the R) present.  Lymphadenopathy:    She has no cervical adenopathy.  Neurological: She is alert and oriented to person, place, and time.  Skin: Skin is warm and dry.  Psychiatric: She has a normal mood and affect.    ASSESSMENT & PLAN:    1. Dilated cardiomyopathy ([place]) EF 45-50.  She is NYHA 2.  Volume status  is stable.  Continue current therapy which includes beta-blocker, ACE inhibitor.  2. Coronary artery calcification seen on CT scan No significant obstruction by CTA in October 2019.  She is not having anginal symptoms.  She is not on aspirin as she is on Xarelto.  Continue statin therapy.  3. Heterozygous factor V Leiden mutation ([place]) Continue Xarelto.  Continue follow-up with hematology.  4. Venous insufficiency She would like to have this further evaluated.  I will refer her to the vein and vascular surgery.   Dispo:  Return in about 1 year (around 03/04/2020) for Routine Follow Up, w/ Dr. Burt Knack, or Richardson Dopp, PA-C, (virtual or in-person).   Medication Adjustments/Labs and Tests Ordered: Current medicines are reviewed at length with the patient today.  Concerns regarding medicines are outlined above.  Tests Ordered: Orders Placed This Encounter  Procedures   Ambulatory referral to Vascular Surgery   EKG 12-Lead   Medication Changes: No orders of the defined types were placed in this encounter.   Signed, Richardson Dopp, PA-C  03/05/2019 10:29 AM    [facility] [place], [place], [place]  [address] Phone: [phone]; Fax: [phone]

## 2019-03-05 ENCOUNTER — Encounter: Payer: Self-pay | Admitting: Physician Assistant

## 2019-03-05 ENCOUNTER — Ambulatory Visit (INDEPENDENT_AMBULATORY_CARE_PROVIDER_SITE_OTHER): Payer: Medicare Other | Admitting: Physician Assistant

## 2019-03-05 ENCOUNTER — Other Ambulatory Visit: Payer: Self-pay

## 2019-03-05 VITALS — BP 116/72 | HR 62 | Ht 66.0 in | Wt 210.0 lb

## 2019-03-05 DIAGNOSIS — I872 Venous insufficiency (chronic) (peripheral): Secondary | ICD-10-CM

## 2019-03-05 DIAGNOSIS — I2510 Atherosclerotic heart disease of native coronary artery without angina pectoris: Secondary | ICD-10-CM

## 2019-03-05 DIAGNOSIS — I420 Dilated cardiomyopathy: Secondary | ICD-10-CM | POA: Diagnosis not present

## 2019-03-05 DIAGNOSIS — D6851 Activated protein C resistance: Secondary | ICD-10-CM | POA: Diagnosis not present

## 2019-03-05 NOTE — Patient Instructions (Signed)
Medication Instructions:  No changes  If you need a refill on your cardiac medications before your next appointment, please call your pharmacy.   Lab work: None   If you have labs (blood work) drawn today and your tests are completely normal, you will receive your results only by: Marland Kitchen MyChart Message (if you have MyChart) OR . A paper copy in the mail If you have any lab test that is abnormal or we need to change your treatment, we will call you to review the results.  Testing/Procedures: None   Follow-Up: At [facility], you and your health needs are our priority.  As part of our continuing mission to provide you with exceptional heart care, we have created designated Provider Care Teams.  These Care Teams include your primary Cardiologist (physician) and Advanced Practice Providers (APPs -  Physician Assistants and Nurse Practitioners) who all work together to provide you with the care you need, when you need it. You will need a follow up appointment in:  12 months.  Please call our office 2 months in advance to schedule this appointment.  You may see Sherren Mocha, MD or Richardson Dopp, PA-C   Any Other Special Instructions Will Be Listed Below (If Applicable).  I will refer you to Vein and Vascular Surgery for your varicose veins

## 2019-03-11 ENCOUNTER — Other Ambulatory Visit: Payer: Self-pay

## 2019-03-11 ENCOUNTER — Inpatient Hospital Stay (HOSPITAL_BASED_OUTPATIENT_CLINIC_OR_DEPARTMENT_OTHER): Payer: Medicare Other | Admitting: Hematology & Oncology

## 2019-03-11 ENCOUNTER — Inpatient Hospital Stay: Payer: Medicare Other | Attending: Hematology & Oncology

## 2019-03-11 VITALS — BP 119/66 | HR 78 | Temp 97.7°F | Resp 18 | Wt 208.0 lb

## 2019-03-11 DIAGNOSIS — Z7901 Long term (current) use of anticoagulants: Secondary | ICD-10-CM | POA: Diagnosis not present

## 2019-03-11 DIAGNOSIS — D6851 Activated protein C resistance: Secondary | ICD-10-CM

## 2019-03-11 DIAGNOSIS — I878 Other specified disorders of veins: Secondary | ICD-10-CM | POA: Insufficient documentation

## 2019-03-11 DIAGNOSIS — I82512 Chronic embolism and thrombosis of left femoral vein: Secondary | ICD-10-CM

## 2019-03-11 DIAGNOSIS — I2510 Atherosclerotic heart disease of native coronary artery without angina pectoris: Secondary | ICD-10-CM

## 2019-03-11 DIAGNOSIS — I8390 Asymptomatic varicose veins of unspecified lower extremity: Secondary | ICD-10-CM | POA: Diagnosis not present

## 2019-03-11 LAB — CMP (CANCER CENTER ONLY)
ALT: 15 U/L (ref 0–44)
AST: 18 U/L (ref 15–41)
Albumin: 4.4 g/dL (ref 3.5–5.0)
Alkaline Phosphatase: 40 U/L (ref 38–126)
Anion gap: 6 (ref 5–15)
BUN: 18 mg/dL (ref 8–23)
CO2: 26 mmol/L (ref 22–32)
Calcium: 9.1 mg/dL (ref 8.9–10.3)
Chloride: 109 mmol/L (ref 98–111)
Creatinine: 0.74 mg/dL (ref 0.44–1.00)
GFR, Est AFR Am: >60 mL/min (ref >60)
GFR, Estimated: >60 mL/min (ref >60)
Glucose, Bld: 122 mg/dL — ABNORMAL HIGH (ref 70–99)
Potassium: 4.5 mmol/L (ref 3.5–5.1)
Sodium: 141 mmol/L (ref 135–145)
Total Bilirubin: 0.8 mg/dL (ref 0.3–1.2)
Total Protein: 7 g/dL (ref 6.5–8.1)

## 2019-03-11 LAB — CBC WITH DIFFERENTIAL (CANCER CENTER ONLY)
Abs Immature Granulocytes: 0.01 10*3/uL (ref 0.00–0.07)
Basophils Absolute: 0 10*3/uL (ref 0.0–0.1)
Basophils Relative: 1 %
Eosinophils Absolute: 0.1 10*3/uL (ref 0.0–0.5)
Eosinophils Relative: 3 %
HCT: 39.9 % (ref 36.0–46.0)
Hemoglobin: 13.3 g/dL (ref 12.0–15.0)
Immature Granulocytes: 0 %
Lymphocytes Relative: 29 %
Lymphs Abs: 1.3 10*3/uL (ref 0.7–4.0)
MCH: 30.2 pg (ref 26.0–34.0)
MCHC: 33.3 g/dL (ref 30.0–36.0)
MCV: 90.7 fL (ref 80.0–100.0)
Monocytes Absolute: 0.3 10*3/uL (ref 0.1–1.0)
Monocytes Relative: 6 %
Neutro Abs: 2.7 10*3/uL (ref 1.7–7.7)
Neutrophils Relative %: 61 %
Platelet Count: 158 10*3/uL (ref 150–400)
RBC: 4.4 MIL/uL (ref 3.87–5.11)
RDW: 13.4 % (ref 11.5–15.5)
WBC Count: 4.4 10*3/uL (ref 4.0–10.5)
nRBC: 0 % (ref 0.0–0.2)

## 2019-03-11 NOTE — Progress Notes (Signed)
Hematology and Oncology Follow Up Visit  Gloria Lambert [id number] 1946/04/29 73 y.o. 03/11/2019   Principle Diagnosis:   Thrombus of the left femoral vein  Heterozygous for factor V Leiden mutation  Current Therapy:    Xarelto 20 mg by mouth daily-finish 6 months in August 2017       Maintenance Xarelto 10 mg by mouth daily      Interim History:  Gloria Lambert is back for follow-up.  She is doing pretty well.  She underwent Gloria parathyroid surgery back in January.  Everything turned out okay with this.  She actually spent 7 weeks up in [place].  She has family up there.  Since Gloria Lambert passed away last year, she wanted to be with family for a while.  She enjoyed herself up there.  It looks like she may have some stasis dermatitis in the legs.  I think she saw Gloria family doctor.  Is going to refer Gloria to a vascular doctor for this.  She is doing well on the Xarelto.  This is low-dose Xarelto.  She has had no bleeding.  There is been no change in bowel or bladder habits.  She has had no fever.  She has had no cough.  Is been no chest wall pain.  Overall, Gloria performance status is ECOG 1.    Medications:  Current Outpatient Medications:  .  famotidine (PEPCID) 40 MG tablet, Take 40 mg by mouth daily., Disp: , Rfl:  .  lisinopril (PRINIVIL,ZESTRIL) 5 MG tablet, Take 0.5 tablets (2.5 mg total) by mouth daily., Disp: 45 tablet, Rfl: 3 .  metoprolol succinate (TOPROL XL) 25 MG 24 hr tablet, Take 1 tablet (25 mg total) by mouth daily., Disp: 90 tablet, Rfl: 3 .  Multiple Vitamins-Minerals (MULTIVITAMIN PO), Take 1 tablet by mouth daily., Disp: , Rfl:  .  Omega-3 Fatty Acids (FISH OIL PO), Take 1 capsule by mouth daily. , Disp: , Rfl:  .  Probiotic Product (PROBIOTIC-10 PO), Take 1 capsule by mouth daily., Disp: , Rfl:  .  rosuvastatin (CRESTOR) 10 MG tablet, Take 1 tablet (10 mg total) by mouth daily., Disp: 90 tablet, Rfl: 3 .  VITAMIN D PO, Take 1 tablet by mouth daily. , Disp: ,  Rfl:  .  XARELTO 10 MG TABS tablet, TAKE 1 TABLET BY MOUTH DAILY WITH SUPPER, Disp: 30 tablet, Rfl: 0  Allergies:  Allergies  Allergen Reactions  . Latex Rash  . Penicillin G Rash and Other (See Comments)    DID THE REACTION INVOLVE: Swelling of the face/tongue/throat, SOB, or low BP? Unknown Sudden or severe rash/hives, skin peeling, or the inside of the mouth or nose? Unknown Did it require medical treatment? Unknown When did it last happen? Years ago If all above answers are "NO", may proceed with cephalosporin use.     Past Medical History, Surgical history, Social history, and Family History were reviewed and updated.  Review of Systems: Review of Systems  Constitutional: Negative.   HENT: Negative.   Eyes: Negative.   Respiratory: Negative.   Cardiovascular: Negative.   Gastrointestinal: Negative.   Genitourinary: Negative.   Musculoskeletal: Negative.   Skin: Negative.   Neurological: Negative.   Endo/Heme/Allergies: Negative.   Psychiatric/Behavioral: Negative.      Physical Exam:  weight is 208 lb (94.3 kg). Gloria temporal temperature is 97.7 F (36.5 C). Gloria blood pressure is 119/66 and Gloria pulse is 78. Gloria respiration is 18 and oxygen saturation is 100%.   Wt Readings  from Last 3 Encounters:  03/11/19 208 lb (94.3 kg)  03/05/19 210 lb (95.3 kg)  09/02/18 205 lb 1.9 oz (93 kg)     Physical Exam Vitals signs reviewed.  HENT:     Head: Normocephalic and atraumatic.  Eyes:     Pupils: Pupils are equal, round, and reactive to light.  Neck:     Musculoskeletal: Normal range of motion.  Cardiovascular:     Rate and Rhythm: Normal rate and regular rhythm.     Heart sounds: Normal heart sounds.  Pulmonary:     Effort: Pulmonary effort is normal.     Breath sounds: Normal breath sounds.  Abdominal:     General: Bowel sounds are normal.     Palpations: Abdomen is soft.  Musculoskeletal: Normal range of motion.        General: No tenderness or deformity.   Lymphadenopathy:     Cervical: No cervical adenopathy.  Skin:    General: Skin is warm and dry.     Findings: No erythema or rash.  Neurological:     Mental Status: She is alert and oriented to person, place, and time.  Psychiatric:        Behavior: Behavior normal.        Thought Content: Thought content normal.        Judgment: Judgment normal.        Lab Results  Component Value Date   WBC 4.4 03/11/2019   HGB 13.3 03/11/2019   HCT 39.9 03/11/2019   MCV 90.7 03/11/2019   PLT 158 03/11/2019     Chemistry      Component Value Date/Time   NA 141 03/11/2019 1044   NA 147 (H) 09/21/2018 1325   NA 148 (H) 05/15/2017 1452   NA 143 07/10/2016 1119   K 4.5 03/11/2019 1044   K 4.4 05/15/2017 1452   K 4.4 07/10/2016 1119   CL 109 03/11/2019 1044   CL 106 05/15/2017 1452   CO2 26 03/11/2019 1044   CO2 26 05/15/2017 1452   CO2 25 07/10/2016 1119   BUN 18 03/11/2019 1044   BUN 16 09/21/2018 1325   BUN 16 05/15/2017 1452   BUN 14.7 07/10/2016 1119   CREATININE 0.74 03/11/2019 1044   CREATININE 0.7 05/15/2017 1452   CREATININE 0.8 07/10/2016 1119      Component Value Date/Time   CALCIUM 9.1 03/11/2019 1044   CALCIUM 10.3 05/15/2017 1452   CALCIUM 10.3 07/10/2016 1119   ALKPHOS 40 03/11/2019 1044   ALKPHOS 51 05/15/2017 1452   ALKPHOS 52 07/10/2016 1119   AST 18 03/11/2019 1044   AST 19 07/10/2016 1119   ALT 15 03/11/2019 1044   ALT 28 05/15/2017 1452   ALT 14 07/10/2016 1119   BILITOT 0.8 03/11/2019 1044   BILITOT 1.03 07/10/2016 1119         Impression and Plan: Ms. Gloria Lambert is a 73 year old white female. She is heterozygous for the factor V Leiden. She has a DVT in the left leg. I'm sure she will have a chronic thrombus.  From my point of view, the venous stasis should not be much of an issue.  I am sure she probably has some venous incompetence with Gloria lower legs.  I will plan to get Gloria back in 6 more months.  I do not see that we had to make any changes  with Gloria Xarelto.   Gloria Napoleon, MD 8/27/202012:04 PM

## 2019-03-17 ENCOUNTER — Other Ambulatory Visit: Payer: Self-pay | Admitting: Physician Assistant

## 2019-03-17 DIAGNOSIS — E785 Hyperlipidemia, unspecified: Secondary | ICD-10-CM

## 2019-03-24 ENCOUNTER — Ambulatory Visit (HOSPITAL_COMMUNITY): Payer: Medicare Other

## 2019-03-26 DIAGNOSIS — Z23 Encounter for immunization: Secondary | ICD-10-CM | POA: Diagnosis not present

## 2019-04-01 ENCOUNTER — Other Ambulatory Visit: Payer: Self-pay | Admitting: Internal Medicine

## 2019-04-01 DIAGNOSIS — E042 Nontoxic multinodular goiter: Secondary | ICD-10-CM

## 2019-04-08 ENCOUNTER — Telehealth: Payer: Self-pay | Admitting: Cardiovascular Disease

## 2019-04-08 NOTE — Telephone Encounter (Signed)
I would like to get MD input. There is slight concern about low blood pressure since she is on blood pressure medications (ACE/BB) for her dilated cardiomyopathy. Her BP is already somewhat on the lower side. This could be monitored closely and medications adjusted (although Im not sure we can go much lower without stopping them). And certainly she should be encouraged to loose weight.    If done correctly, keto diet can be very beneficial to overall health.  Dr. Burt Knack thoughts?

## 2019-04-08 NOTE — Telephone Encounter (Signed)
New Message    Patient wants to know if the Keto Diet will be ok for her to start with the medication she takes.  Please call patient to discuss.

## 2019-04-08 NOTE — Telephone Encounter (Signed)
Will forward to PharmD for advisement. 

## 2019-04-11 ENCOUNTER — Other Ambulatory Visit: Payer: Self-pay | Admitting: Hematology & Oncology

## 2019-04-11 DIAGNOSIS — I82512 Chronic embolism and thrombosis of left femoral vein: Secondary | ICD-10-CM

## 2019-04-11 NOTE — Telephone Encounter (Signed)
Should be fine. Would advise that she stay well hydrated with plenty of water while doing keto diet.

## 2019-04-12 NOTE — Telephone Encounter (Signed)
Patient made aware it is ok to do keto diet. Try to stay well hydrated with water. Advised can cause low BP hence staying well hydrated. Patient appreciative of the call

## 2019-04-15 ENCOUNTER — Other Ambulatory Visit: Payer: Medicare Other

## 2019-04-19 DIAGNOSIS — H26492 Other secondary cataract, left eye: Secondary | ICD-10-CM | POA: Diagnosis not present

## 2019-04-19 DIAGNOSIS — H26491 Other secondary cataract, right eye: Secondary | ICD-10-CM | POA: Diagnosis not present

## 2019-04-27 DIAGNOSIS — H26492 Other secondary cataract, left eye: Secondary | ICD-10-CM | POA: Diagnosis not present

## 2019-04-29 ENCOUNTER — Ambulatory Visit
Admission: RE | Admit: 2019-04-29 | Discharge: 2019-04-29 | Disposition: A | Discharge status: Home / Self Care | Payer: Medicare Other | Source: Ambulatory Visit | Attending: Internal Medicine | Admitting: Internal Medicine

## 2019-04-29 DIAGNOSIS — E042 Nontoxic multinodular goiter: Secondary | ICD-10-CM | POA: Diagnosis not present

## 2019-05-06 ENCOUNTER — Other Ambulatory Visit: Payer: Self-pay

## 2019-05-06 DIAGNOSIS — I872 Venous insufficiency (chronic) (peripheral): Secondary | ICD-10-CM

## 2019-05-07 ENCOUNTER — Telehealth (HOSPITAL_COMMUNITY): Payer: Self-pay | Admitting: *Deleted

## 2019-05-07 NOTE — Telephone Encounter (Signed)

## 2019-05-09 ENCOUNTER — Other Ambulatory Visit: Payer: Self-pay | Admitting: Hematology & Oncology

## 2019-05-09 DIAGNOSIS — I82512 Chronic embolism and thrombosis of left femoral vein: Secondary | ICD-10-CM

## 2019-05-10 ENCOUNTER — Other Ambulatory Visit: Payer: Self-pay

## 2019-05-10 ENCOUNTER — Ambulatory Visit (INDEPENDENT_AMBULATORY_CARE_PROVIDER_SITE_OTHER): Payer: Medicare Other | Admitting: Surgery

## 2019-05-10 ENCOUNTER — Ambulatory Visit (HOSPITAL_COMMUNITY)
Admission: RE | Admit: 2019-05-10 | Discharge: 2019-05-10 | Disposition: A | Discharge status: Home / Self Care | Payer: Medicare Other | Source: Ambulatory Visit | Attending: Surgery | Admitting: Surgery

## 2019-05-10 ENCOUNTER — Encounter: Payer: Self-pay | Admitting: Surgery

## 2019-05-10 VITALS — BP 110/59 | HR 60 | Temp 97.9°F | Resp 20 | Ht 66.0 in | Wt 205.0 lb

## 2019-05-10 DIAGNOSIS — I872 Venous insufficiency (chronic) (peripheral): Secondary | ICD-10-CM

## 2019-05-10 DIAGNOSIS — I2510 Atherosclerotic heart disease of native coronary artery without angina pectoris: Secondary | ICD-10-CM | POA: Diagnosis not present

## 2019-05-10 NOTE — Progress Notes (Signed)
Vascular and Vein Specialist of [facility]  Patient name: Gloria Lambert MRN: [id number] DOB: 06/26/1946 Sex: female   REQUESTING PROVIDER:    [place]:    Venous disease  HISTORY OF PRESENT ILLNESS:   Gloria Lambert is a 73 y.o. female, who is referred for evaluation of chronic venous insufficiency.  Patient has a history of a left leg DVT several years ago.  She was found to be heterozygous factor V Leiden.  She is maintained on Xarelto.  She has had chronic leg swelling for some time and has developed some skin discoloration around her ankles.  She has not had ulcers formed.  She occasionally will wear light compression.  The patient has a Baker's cyst behind the right knee which has been drained several times.  She takes a statin for hypercholesterolemia.  She is on ACE inhibitor for hypertension.  She is a non-smoker.  PAST MEDICAL HISTORY    Past Medical History:  Diagnosis Date  . Baker's cyst of knee, right   . 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 Coronary artery calcification seen on CT scan    Coronary CTA 10/19:  Ca score 12 (49th percentile - intermediate risk); mid LAD calcified plaque; no obstructive CAD; Small to mod hiatal hernia  . Depression   . Dilated cardiomyopathy ([place]) 05/19/2018   Echo 04/10/18 - Mod LVH, EF 45-50, ant-lat HK, Gr 1 DD, trivial MR  . Diverticulosis   . Edema   . GERD (gastroesophageal reflux disease)   . Heart murmur 03/2018  . Hemorrhoids   . Heterozygous factor V Leiden mutation ([place]) 11/06/2015  . History of colon polyps   . History of echocardiogram    Echo 9/19: mod LVH, EF 45-50, ant-lat HK, Gr 1 DD, trivial MR  . History of hiatal hernia 05/01/2018   Noted on CT  . HLD (hyperlipidemia)   . Hyperparathyroidism ([place])    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 - surgery not recommended  . OA (osteoarthritis)   . Obese   . Osteopenia    . Sleep apnea    does not use CPAP because of fit  . Stress incontinence      FAMILY HISTORY   Family History  Problem Relation Age of Onset  . Breast cancer Mother 51  . Cancer Father        ?intestinal; ?renal  . Heart attack Paternal Grandmother   . Heart murmur Sister     SOCIAL HISTORY:   Social History   Socioeconomic History  . Marital status: Widowed    Spouse name: Not on file  . Number of children: 2  . Years of education: Not on file  . Highest education level: Not on file  Occupational History  . Occupation: retired    Comment: previous Transport planner  . Financial resource strain: Not on file  . Food insecurity    Worry: Not on file    Inability: Not on file  . Transportation needs    Medical: Not on file    Non-medical: Not on file  Tobacco Use  . Smoking status: Never Smoker  . Smokeless tobacco: Never Used  Substance and Sexual Activity  . Alcohol use: Not Currently    Alcohol/week: 0.0 standard drinks    Comment: socaily  . Drug use: No  . Sexual activity: Not on file  Lifestyle  . Physical activity    Days  per week: Not on file    Minutes per session: Not on file  . Stress: Not on file  Relationships  . Social Herbalist on phone: Not on file    Gets together: Not on file    Attends religious service: Not on file    Active member of club or organization: Not on file    Attends meetings of clubs or organizations: Not on file    Relationship status: Not on file  . Intimate partner violence    Fear of current or ex partner: Not on file    Emotionally abused: Not on file    Physically abused: Not on file    Forced sexual activity: Not on file  Other Topics Concern  . Not on file  Social History Narrative   Moved from [place] in 19-Oct-2014   Husband died 12-18-2017 (cancer)   Retired    ALLERGIES:    Allergies  Allergen Reactions  . Latex Rash  . Penicillin G Rash and Other (See Comments)    DID THE REACTION  INVOLVE: Swelling of the face/tongue/throat, SOB, or low BP? Unknown Sudden or severe rash/hives, skin peeling, or the inside of the mouth or nose? Unknown Did it require medical treatment? Unknown When did it last happen? Years ago If all above answers are "NO", may proceed with cephalosporin use.     CURRENT MEDICATIONS:    Current Outpatient Medications  Medication Sig Dispense Refill  . famotidine (PEPCID) 40 MG tablet Take 40 mg by mouth daily.    Marland Kitchen lisinopril (PRINIVIL,ZESTRIL) 5 MG tablet Take 0.5 tablets (2.5 mg total) by mouth daily. 45 tablet 3  . metoprolol succinate (TOPROL-XL) 25 MG 24 hr tablet TAKE 1 TABLET BY MOUTH EVERY DAY 90 tablet 3  . Multiple Vitamins-Minerals (MULTIVITAMIN PO) Take 1 tablet by mouth daily.    . Omega-3 Fatty Acids (FISH OIL PO) Take 1 capsule by mouth daily.     . Probiotic Product (PROBIOTIC-10 PO) Take 1 capsule by mouth daily.    . rosuvastatin (CRESTOR) 10 MG tablet TAKE 1 TABLET BY MOUTH EVERY DAY 90 tablet 3  . VITAMIN D PO Take 1 tablet by mouth daily.     Alveda Reasons 10 MG TABS tablet TAKE 1 TABLET BY MOUTH DAILY WITH SUPPER 30 tablet 0   No current facility-administered medications for this visit.     REVIEW OF SYSTEMS:   [X]  denotes positive finding, [ ]  denotes negative finding Cardiac  Comments:  Chest pain or chest pressure: x   Shortness of breath upon exertion:    Short of breath when lying flat:    Irregular heart rhythm:        Vascular    Pain in calf, thigh, or hip brought on by ambulation:    Pain in feet at night that wakes you up from your sleep:     Blood clot in your veins:    Leg swelling:         Pulmonary    Oxygen at home:    Productive cough:     Wheezing:         Neurologic    Sudden weakness in arms or legs:     Sudden numbness in arms or legs:     Sudden onset of difficulty speaking or slurred speech:    Temporary loss of vision in one eye:     Problems with dizziness:  Gastrointestinal     Blood in stool:      Vomited blood:         Genitourinary    Burning when urinating:     Blood in urine:        Psychiatric    Major depression:         Hematologic    Bleeding problems:    Problems with blood clotting too easily:        Skin    Rashes or ulcers:        Constitutional    Fever or chills:     PHYSICAL EXAM:   Vitals:   05/10/19 1357  BP: (!) 110/59  Pulse: 60  Resp: 20  Temp: 97.9 F (36.6 C)  SpO2: 99%  Weight: 205 lb (93 kg)  Height: 5\' 6"  (1.676 m)    GENERAL: The patient is a well-nourished female, in no acute distress. The vital signs are documented above. CARDIAC: There is a regular rate and rhythm.  VASCULAR: Palpable pedal pulses.  2+ pitting edema to the right leg and 1+ to the left.  Hyperpigmentation with dermatosclerosis on bilateral lower extremity, right greater than left PULMONARY: Nonlabored respirations MUSCULOSKELETAL: There are no major deformities or cyanosis. NEUROLOGIC: No focal weakness or paresthesias are detected. SKIN: There are no ulcers or rashes noted. PSYCHIATRIC: The patient has a normal affect.  STUDIES:   I have reviewed the following: Venous Reflux Times Normal value < 0.5 sec +------------------------------+----------+---------+                               Right (ms)Left (ms) +------------------------------+----------+---------+ CFV                           1247.00   2993.00   +------------------------------+----------+---------+ FV                                      4599.00   +------------------------------+----------+---------+ Popliteal                     1716.00   4049.00   +------------------------------+----------+---------+ GSV at Saphenofemoral junction2757.00   3587.00   +------------------------------+----------+---------+ GSV prox thigh                          3726.00   +------------------------------+----------+---------+ GSV mid thigh                            3726.00   +------------------------------+----------+---------+ GSV dist thigh                3733.00   2839.00   +------------------------------+----------+---------+ GSV at knee                   4093.00   2941.00   +------------------------------+----------+---------+ GSV prox calf                 1511.00   3198.00   +------------------------------+----------+---------+ GSV mid calf                  1665.00   1350.00   +------------------------------+----------+---------+ SSV origin                    961.00  506.00    +------------------------------+----------+---------+ SSV prox                      807.00    2318.00   +------------------------------+----------+---------+ SSV mid                       1291.00   4144.00   +------------------------------+----------+---------+  +------------------------------+----------+---------+ VEIN DIAMETERS:               Right (cm)Left (cm) +------------------------------+----------+---------+ GSV at [place].443     0.645     +------------------------------+----------+---------+ GSV at prox thigh             0.440     0.309     +------------------------------+----------+---------+ GSV at mid thigh              0.472     0.390     +------------------------------+----------+---------+ GSV at distal thigh           0.345     0.414     +------------------------------+----------+---------+ GSV at knee                   0.346     0.371     +------------------------------+----------+---------+ GSV prox calf                 0.343     0.542     +------------------------------+----------+---------+ GSV mid calf                  0.331     0.288     +------------------------------+----------+---------+ SSV origin                    0.254     0.251     +------------------------------+----------+---------+ SSV prox                      0.271     0.189      +------------------------------+----------+---------+ SSV mid                       0.157     0.216     +------------------------------+----------+---------+       Summary: Right: No reflux was noted in the femoral vein in the thigh.   Abnormal reflux times were noted in the common femoral vein, popliteal vein, great saphenous vein at the saphenofemoral junction, great saphenous vein at the distal thigh, great saphenous vein at the knee, great saphenous vein at the prox calf, great  saphenous vein at the mid calf, origin of the small saphenous vein, proximal small saphenous vein, and mid small saphenous vein.  There is no evidence of deep vein thrombosis in the lower extremity within the CFV, FV, and POPV. There is no evidence of superficial venous thrombosis within the visualized GSV and SSV.   Left: Abnormal reflux times were noted in the common femoral vein, femoral vein in the thigh, popliteal vein, great saphenous vein at the saphenofemoral junction, great saphenous vein at the proximal thigh, great saphenous vein at the mid thigh, great  saphenous vein at the distal thigh, great saphenous vein at the knee, great saphenous vein at the proximal calf, great saphenous vein at the mid calf, origin of the small saphenous vein, prox small saphenous vein, and mid small saphenous vein.  There is no evidence of deep vein thrombosis in the lower extremity within the CFV, FV, and POPV. There is no  evidence of superficial venous thrombosis within the visualized GSV and SSV.  ASSESSMENT and PLAN   Chronic venous insufficiency: The patient has a history of DVT and known factor V heterozygous.  She is maintained on chronic anticoagulation.  She has bilateral lower extremity edema.  Ultrasound studies today showed chronic venous insufficiency bilaterally..  The saphenous veins do have reflux bilaterally however are relatively small in diameter.  Therefore I do not think that she would be a  good candidate for endovenous laser ablation.  We discussed that her best option for symptom management is to wear 20-30 compression stockings on a consistent basis.  We gave her information about how to get the appropriate grade compression.  She will follow-up on an as-needed basis.   Leia Alf, MD, FACS [facility] of [facility] [phone] Pager [phone]

## 2019-05-14 ENCOUNTER — Encounter: Payer: Self-pay | Admitting: Surgery

## 2019-05-26 DIAGNOSIS — M810 Age-related osteoporosis without current pathological fracture: Secondary | ICD-10-CM | POA: Diagnosis not present

## 2019-06-19 ENCOUNTER — Other Ambulatory Visit: Payer: Self-pay | Admitting: Hematology & Oncology

## 2019-06-19 DIAGNOSIS — I82512 Chronic embolism and thrombosis of left femoral vein: Secondary | ICD-10-CM

## 2019-06-23 DIAGNOSIS — M810 Age-related osteoporosis without current pathological fracture: Secondary | ICD-10-CM | POA: Diagnosis not present

## 2019-07-15 ENCOUNTER — Other Ambulatory Visit: Payer: Self-pay | Admitting: Hematology & Oncology

## 2019-07-15 DIAGNOSIS — I82512 Chronic embolism and thrombosis of left femoral vein: Secondary | ICD-10-CM

## 2019-08-09 DIAGNOSIS — R8290 Unspecified abnormal findings in urine: Secondary | ICD-10-CM | POA: Diagnosis not present

## 2019-08-13 DIAGNOSIS — R300 Dysuria: Secondary | ICD-10-CM | POA: Diagnosis not present

## 2019-08-15 ENCOUNTER — Other Ambulatory Visit: Payer: Self-pay | Admitting: Physician Assistant

## 2019-08-18 ENCOUNTER — Inpatient Hospital Stay: Payer: Medicare Other | Attending: Hematology & Oncology | Admitting: Hematology & Oncology

## 2019-08-18 ENCOUNTER — Other Ambulatory Visit: Payer: Self-pay | Admitting: Hematology & Oncology

## 2019-08-18 ENCOUNTER — Other Ambulatory Visit: Payer: Self-pay

## 2019-08-18 ENCOUNTER — Encounter: Payer: Self-pay | Admitting: Hematology & Oncology

## 2019-08-18 ENCOUNTER — Inpatient Hospital Stay: Payer: Medicare Other

## 2019-08-18 VITALS — BP 120/57 | HR 71 | Temp 97.6°F | Resp 18 | Wt 207.0 lb

## 2019-08-18 DIAGNOSIS — Z7901 Long term (current) use of anticoagulants: Secondary | ICD-10-CM | POA: Insufficient documentation

## 2019-08-18 DIAGNOSIS — Z79899 Other long term (current) drug therapy: Secondary | ICD-10-CM | POA: Diagnosis not present

## 2019-08-18 DIAGNOSIS — I82412 Acute embolism and thrombosis of left femoral vein: Secondary | ICD-10-CM | POA: Diagnosis not present

## 2019-08-18 DIAGNOSIS — I82512 Chronic embolism and thrombosis of left femoral vein: Secondary | ICD-10-CM

## 2019-08-18 DIAGNOSIS — D6851 Activated protein C resistance: Secondary | ICD-10-CM | POA: Insufficient documentation

## 2019-08-18 LAB — CMP (CANCER CENTER ONLY)
ALT: 16 U/L (ref 0–44)
AST: 18 U/L (ref 15–41)
Albumin: 4.5 g/dL (ref 3.5–5.0)
Alkaline Phosphatase: 42 U/L (ref 38–126)
Anion gap: 6 (ref 5–15)
BUN: 26 mg/dL — ABNORMAL HIGH (ref 8–23)
CO2: 27 mmol/L (ref 22–32)
Calcium: 9.7 mg/dL (ref 8.9–10.3)
Chloride: 108 mmol/L (ref 98–111)
Creatinine: 0.81 mg/dL (ref 0.44–1.00)
GFR, Est AFR Am: >60 mL/min (ref >60)
GFR, Estimated: >60 mL/min (ref >60)
Glucose, Bld: 96 mg/dL (ref 70–99)
Potassium: 4.6 mmol/L (ref 3.5–5.1)
Sodium: 141 mmol/L (ref 135–145)
Total Bilirubin: 1 mg/dL (ref 0.3–1.2)
Total Protein: 7.1 g/dL (ref 6.5–8.1)

## 2019-08-18 LAB — CBC WITH DIFFERENTIAL (CANCER CENTER ONLY)
Abs Immature Granulocytes: 0.01 10*3/uL (ref 0.00–0.07)
Basophils Absolute: 0 10*3/uL (ref 0.0–0.1)
Basophils Relative: 1 %
Eosinophils Absolute: 0.1 10*3/uL (ref 0.0–0.5)
Eosinophils Relative: 3 %
HCT: 38.2 % (ref 36.0–46.0)
Hemoglobin: 12.4 g/dL (ref 12.0–15.0)
Immature Granulocytes: 0 %
Lymphocytes Relative: 26 %
Lymphs Abs: 1.3 10*3/uL (ref 0.7–4.0)
MCH: 28.6 pg (ref 26.0–34.0)
MCHC: 32.5 g/dL (ref 30.0–36.0)
MCV: 88.2 fL (ref 80.0–100.0)
Monocytes Absolute: 0.4 10*3/uL (ref 0.1–1.0)
Monocytes Relative: 8 %
Neutro Abs: 3 10*3/uL (ref 1.7–7.7)
Neutrophils Relative %: 62 %
Platelet Count: 181 10*3/uL (ref 150–400)
RBC: 4.33 MIL/uL (ref 3.87–5.11)
RDW: 13.6 % (ref 11.5–15.5)
WBC Count: 4.8 10*3/uL (ref 4.0–10.5)
nRBC: 0 % (ref 0.0–0.2)

## 2019-08-18 NOTE — Progress Notes (Signed)
Hematology and Oncology Follow Up Visit  Gloria Lambert [id number] [birth year] 74 y.o. 08/18/2019   Principle Diagnosis:   Thrombus of the left femoral vein  Heterozygous for Factor V Leiden mutation  Current Therapy:    Xarelto 20 mg by mouth daily-finish 6 months in August 2017       Maintenance Xarelto 10 mg by mouth daily      Interim History:  Gloria Lambert is back for follow-up.  Overall, she seems to be doing okay.  We saw her 6 months ago.  She really has had no problems since we last saw her.  I think she did see a vein specialist.  She cannot remember who she saw.  She cannot remember where the office was.  She had studies done.  I would not think that they would have shown anything with thrombotic issues.  We did Dopplers of her legs on 05/10/2019.  The report shows no evidence of thromboembolic disease in the deep veins in either leg.  She is doing okay on the Xarelto.  She is on maintenance low-dose Xarelto.  The vein specialist gave her some compression stockings.  They are thigh-high compression stockings.  She does not like to wear them.  They are quite difficult to put off what she says.  She has had no bleeding.  There is no change in bowel or bladder habits.  She has had no headache.  She has had no issues with mouth sores.  There is been no chest wall pain.  She has had no cough or shortness of breath.  She has been very diligent with avoiding the coronavirus.  Overall, her performance status is ECOG 1.    Medications:  Current Outpatient Medications:  .  citalopram (CELEXA) 10 MG tablet, Take 10 mg by mouth daily., Disp: , Rfl:  .  famotidine (PEPCID) 40 MG tablet, Take 40 mg by mouth daily., Disp: , Rfl:  .  lisinopril (ZESTRIL) 5 MG tablet, TAKE 1/2 TABLET BY MOUTH DAILY, Disp: 45 tablet, Rfl: 2 .  metoprolol succinate (TOPROL-XL) 25 MG 24 hr tablet, TAKE 1 TABLET BY MOUTH EVERY DAY, Disp: 90 tablet, Rfl: 3 .  Multiple Vitamin (MULTIVITAMIN) capsule, Take 1  capsule by mouth daily., Disp: , Rfl:  .  Multiple Vitamins-Minerals (MULTIVITAMIN PO), Take 1 tablet by mouth daily., Disp: , Rfl:  .  Omega-3 Fatty Acids (FISH OIL PO), Take 1 capsule by mouth daily. , Disp: , Rfl:  .  Probiotic Product (PROBIOTIC-10 PO), Take 1 capsule by mouth daily., Disp: , Rfl:  .  rosuvastatin (CRESTOR) 10 MG tablet, TAKE 1 TABLET BY MOUTH EVERY DAY, Disp: 90 tablet, Rfl: 3 .  VITAMIN D PO, Take 1 tablet by mouth daily. , Disp: , Rfl:  .  XARELTO 10 MG TABS tablet, TAKE 1 TABLET BY MOUTH DAILY WITH SUPPER, Disp: 30 tablet, Rfl: 0  Allergies:  Allergies  Allergen Reactions  . Latex Rash  . Penicillin G Rash and Other (See Comments)    DID THE REACTION INVOLVE: Swelling of the face/tongue/throat, SOB, or low BP? Unknown Sudden or severe rash/hives, skin peeling, or the inside of the mouth or nose? Unknown Did it require medical treatment? Unknown When did it last happen? Years ago If all above answers are "NO", may proceed with cephalosporin use.     Past Medical History, Surgical history, Social history, and Family History were reviewed and updated.  Review of Systems: Review of Systems  Constitutional: Negative.   HENT:  Negative.   Eyes: Negative.   Respiratory: Negative.   Cardiovascular: Negative.   Gastrointestinal: Negative.   Genitourinary: Negative.   Musculoskeletal: Negative.   Skin: Negative.   Neurological: Negative.   Endo/Heme/Allergies: Negative.   Psychiatric/Behavioral: Negative.      Physical Exam:  weight is 207 lb (93.9 kg). Her temporal temperature is 97.6 F (36.4 C). Her blood pressure is 120/57 (abnormal) and her pulse is 71. Her respiration is 18 and oxygen saturation is 99%.   Wt Readings from Last 3 Encounters:  08/18/19 207 lb (93.9 kg)  05/10/19 205 lb (93 kg)  03/11/19 208 lb (94.3 kg)     Physical Exam Vitals reviewed.  HENT:     Head: Normocephalic and atraumatic.  Eyes:     Pupils: Pupils are equal, round,  and reactive to light.  Cardiovascular:     Rate and Rhythm: Normal rate and regular rhythm.     Heart sounds: Normal heart sounds.  Pulmonary:     Effort: Pulmonary effort is normal.     Breath sounds: Normal breath sounds.  Abdominal:     General: Bowel sounds are normal.     Palpations: Abdomen is soft.  Musculoskeletal:        General: No tenderness or deformity. Normal range of motion.     Cervical back: Normal range of motion.  Lymphadenopathy:     Cervical: No cervical adenopathy.  Skin:    General: Skin is warm and dry.     Findings: No erythema or rash.  Neurological:     Mental Status: She is alert and oriented to person, place, and time.  Psychiatric:        Behavior: Behavior normal.        Thought Content: Thought content normal.        Judgment: Judgment normal.        Lab Results  Component Value Date   WBC 4.8 08/18/2019   HGB 12.4 08/18/2019   HCT 38.2 08/18/2019   MCV 88.2 08/18/2019   PLT 181 08/18/2019     Chemistry      Component Value Date/Time   NA 141 08/18/2019 1121   NA 147 (H) 09/21/2018 1325   NA 148 (H) 05/15/2017 1452   NA 143 07/10/2016 1119   K 4.6 08/18/2019 1121   K 4.4 05/15/2017 1452   K 4.4 07/10/2016 1119   CL 108 08/18/2019 1121   CL 106 05/15/2017 1452   CO2 27 08/18/2019 1121   CO2 26 05/15/2017 1452   CO2 25 07/10/2016 1119   BUN 26 (H) 08/18/2019 1121   BUN 16 09/21/2018 1325   BUN 16 05/15/2017 1452   BUN 14.7 07/10/2016 1119   CREATININE 0.81 08/18/2019 1121   CREATININE 0.7 05/15/2017 1452   CREATININE 0.8 07/10/2016 1119      Component Value Date/Time   CALCIUM 9.7 08/18/2019 1121   CALCIUM 10.3 05/15/2017 1452   CALCIUM 10.3 07/10/2016 1119   ALKPHOS 42 08/18/2019 1121   ALKPHOS 51 05/15/2017 1452   ALKPHOS 52 07/10/2016 1119   AST 18 08/18/2019 1121   AST 19 07/10/2016 1119   ALT 16 08/18/2019 1121   ALT 28 05/15/2017 1452   ALT 14 07/10/2016 1119   BILITOT 1.0 08/18/2019 1121   BILITOT 1.03  07/10/2016 1119      Impression and Plan: Gloria Lambert is a 74 year old white female. She is heterozygous for the factor V Leiden. She has had a DVT in the  left leg.   We will keep her on Xarelto for right now.  However I think this is going to help her out in the long-term.  We will plan to get her back in another 6 months.  Hopefully, she will be able to go up to [facility] before we see her.  She has family up there.  She was a little unhappy that she cannot go up there over the holiday season.  Volanda Napoleon, MD 2/3/202112:25 PM

## 2019-09-12 ENCOUNTER — Other Ambulatory Visit: Payer: Self-pay | Admitting: Hematology & Oncology

## 2019-09-12 DIAGNOSIS — I82512 Chronic embolism and thrombosis of left femoral vein: Secondary | ICD-10-CM

## 2019-09-16 ENCOUNTER — Other Ambulatory Visit: Payer: Self-pay | Admitting: Hematology & Oncology

## 2019-09-16 DIAGNOSIS — I82512 Chronic embolism and thrombosis of left femoral vein: Secondary | ICD-10-CM

## 2019-09-27 DIAGNOSIS — Z1231 Encounter for screening mammogram for malignant neoplasm of breast: Secondary | ICD-10-CM | POA: Diagnosis not present

## 2019-09-28 DIAGNOSIS — Z23 Encounter for immunization: Secondary | ICD-10-CM | POA: Diagnosis not present

## 2019-10-19 DIAGNOSIS — Z23 Encounter for immunization: Secondary | ICD-10-CM | POA: Diagnosis not present

## 2019-10-20 ENCOUNTER — Other Ambulatory Visit: Payer: Self-pay | Admitting: Hematology & Oncology

## 2019-10-20 DIAGNOSIS — I82512 Chronic embolism and thrombosis of left femoral vein: Secondary | ICD-10-CM

## 2019-11-17 ENCOUNTER — Other Ambulatory Visit: Payer: Self-pay | Admitting: Hematology & Oncology

## 2019-11-17 DIAGNOSIS — I82512 Chronic embolism and thrombosis of left femoral vein: Secondary | ICD-10-CM

## 2019-11-26 DIAGNOSIS — Z20828 Contact with and (suspected) exposure to other viral communicable diseases: Secondary | ICD-10-CM | POA: Diagnosis not present

## 2019-11-26 DIAGNOSIS — Z03818 Encounter for observation for suspected exposure to other biological agents ruled out: Secondary | ICD-10-CM | POA: Diagnosis not present

## 2019-12-17 ENCOUNTER — Other Ambulatory Visit: Payer: Self-pay | Admitting: Hematology & Oncology

## 2019-12-17 DIAGNOSIS — I82512 Chronic embolism and thrombosis of left femoral vein: Secondary | ICD-10-CM

## 2019-12-23 IMAGING — CT CT HEART MORP W/ CTA COR W/ SCORE W/ CA W/CM &/OR W/O CM
4 of 7 series · 8 of 20 positions shown, 9 images · IV contrast (APPLIED)
Comparison: None.

EXAM:
OVER-READ INTERPRETATION  CT CHEST

The following report is an over-read performed by radiologist Dr.
does not include interpretation of cardiac or coronary anatomy or
pathology. The cardiac/coronary CT interpretation by the
cardiologist is attached.
CLINICAL DATA: Chest pain
Cardiac CTA
MEDICATIONS:
Sub lingual nitro. 4mg x 2
TECHNIQUE: The patient was scanned on a Siemens [REDACTED]ice scanner. Gantry
rotation speed was 250 msecs. Collimation was 0.6 mm. A 100 kV
prospective scan was triggered in the ascending thoracic aorta at
35-75% of the R-R interval. Average HR during the scan was 60 bpm.
The 3D data set was interpreted on a dedicated work station using
MPR, MIP and VRT modes. A total of 80cc of contrast was used.

[Series 6: best diast 71 % · axial · 0.36mm/px · z∈[+1187,+1226]mm · 2 of 298 slices shown, 3 images]
[im 100/298  vessel]
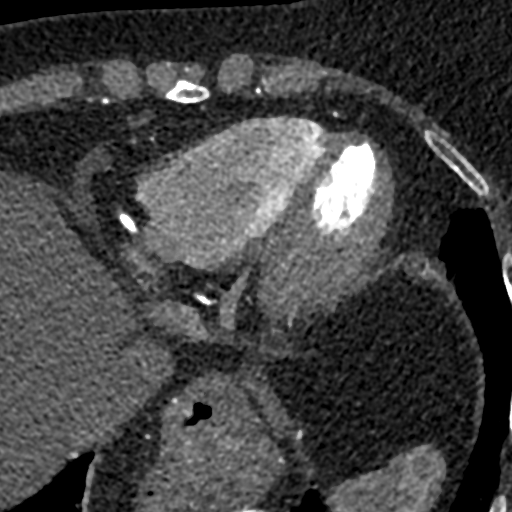
[im 100/298  lung]
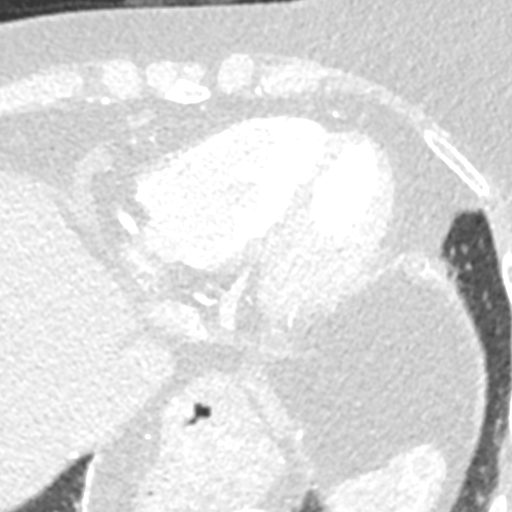
[im 199/298  vessel]
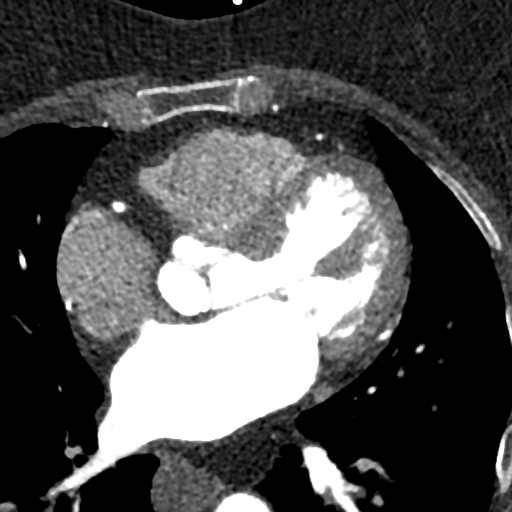

[Series 7: best syst 54 % · axial · 0.36mm/px · z∈[+1187,+1226]mm · 2 of 298 slices shown]
[im 100/298  vessel]
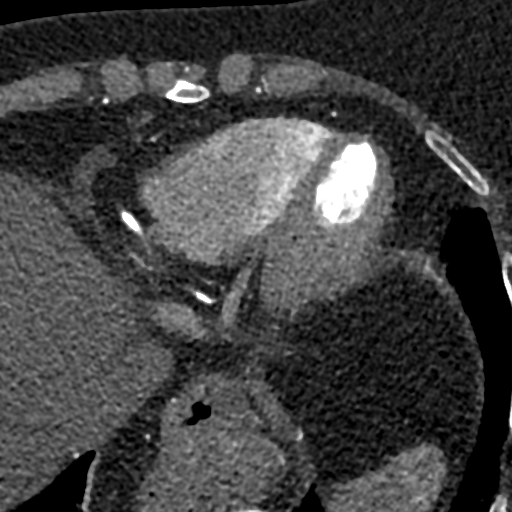
[im 199/298  vessel]
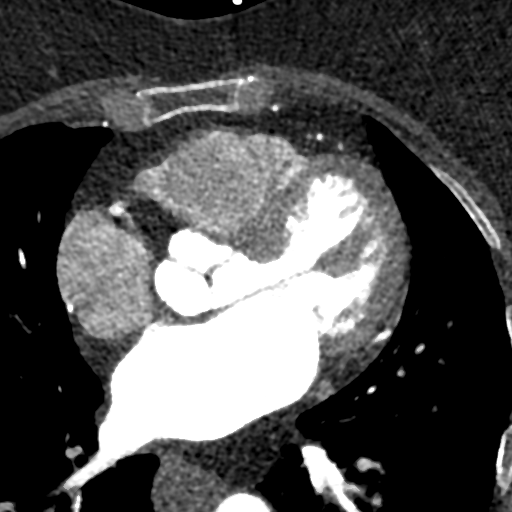

[Series 8: ts diast sharp 71 % · axial · 0.36mm/px · z∈[+1187,+1226]mm · 2 of 298 slices shown]
[im 100/298  lung]
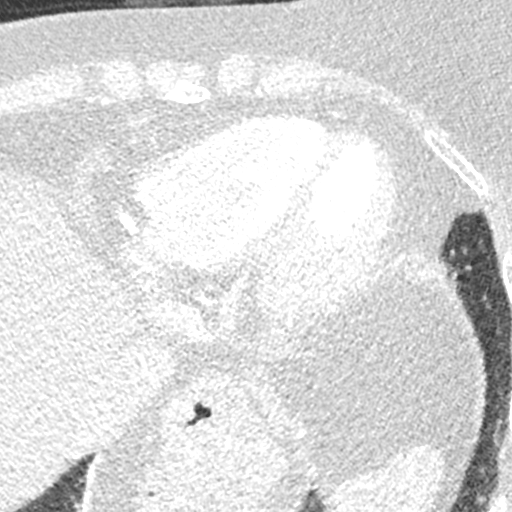
[im 199/298  lung]
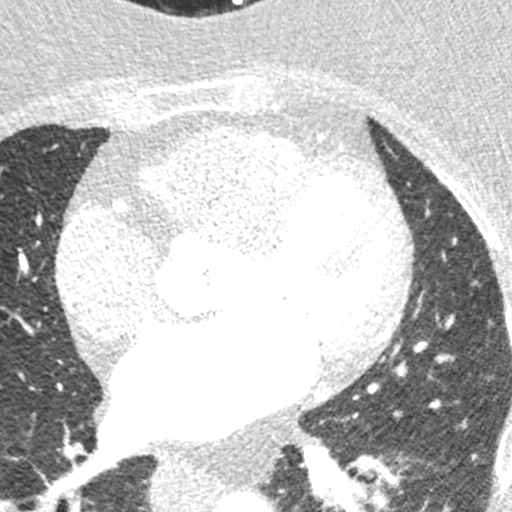

[Series 9: ts syst sharp 54 % · axial · 0.36mm/px · z∈[+1187,+1226]mm · 2 of 298 slices shown]
[im 100/298  lung]
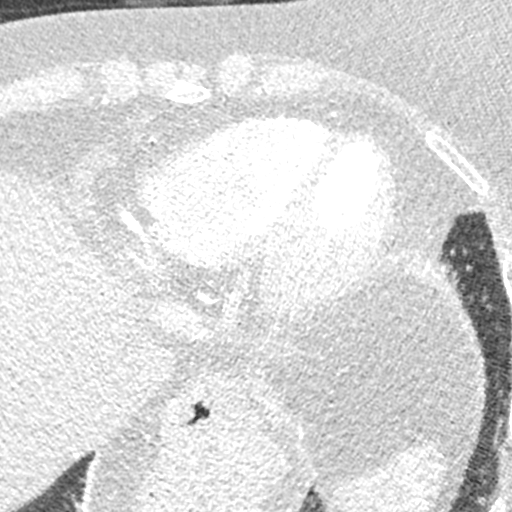
[im 199/298  lung]
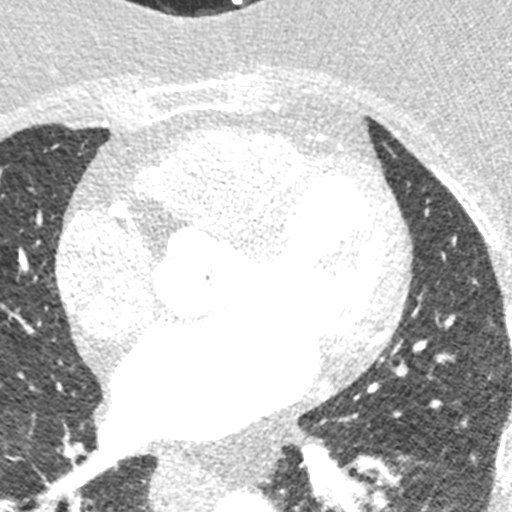

[8 of 20 positions shown; findings below may reference images not displayed]

FINDINGS: The visualized portions of the lower lung fields show no suspicious
nodules, masses, or infiltrates. The visualized portions of the
mediastinum and chest wall are unremarkable, except for a small to
moderate size hiatal hernia.
IMPRESSION: Small to moderate hiatal hernia.

No other significant non-cardiac abnormality in visualized portion
of the thorax.
FINDINGS: Non-cardiac: See separate report from [REDACTED].

The pulmonary veins drained normally to the left atrium.

Calcium Score: 12 Agatston units.

Coronary Arteries: Right dominant with no anomalies

LM: No plaque or stenosis.

LAD system: Calcified plaque in the mid LAD, no significant
stenosis.

Circumflex system: No plaque or stenosis.

RCA system: No plaque or stenosis.
IMPRESSION: 1. Coronary artery calcium score 12 Agatston units, this places the
patient in the 49th percentile for age and gender, suggesting
intermediate risk for future cardiac events.

2.  No obstructive coronary artery disease noted.

Tais Ragland

## 2020-01-19 ENCOUNTER — Other Ambulatory Visit: Payer: Self-pay | Admitting: Hematology & Oncology

## 2020-01-19 DIAGNOSIS — I82512 Chronic embolism and thrombosis of left femoral vein: Secondary | ICD-10-CM

## 2020-02-01 ENCOUNTER — Other Ambulatory Visit: Payer: Self-pay | Admitting: Physician Assistant

## 2020-02-15 ENCOUNTER — Inpatient Hospital Stay: Payer: Medicare Other | Attending: Hematology & Oncology

## 2020-02-15 ENCOUNTER — Telehealth: Payer: Self-pay | Admitting: Hematology & Oncology

## 2020-02-15 ENCOUNTER — Other Ambulatory Visit: Payer: Self-pay

## 2020-02-15 ENCOUNTER — Inpatient Hospital Stay (HOSPITAL_BASED_OUTPATIENT_CLINIC_OR_DEPARTMENT_OTHER): Payer: Medicare Other | Admitting: Family

## 2020-02-15 ENCOUNTER — Encounter: Payer: Self-pay | Admitting: Family

## 2020-02-15 VITALS — BP 98/40 | HR 60 | Temp 98.0°F | Resp 18 | Ht 62.0 in | Wt 204.0 lb

## 2020-02-15 DIAGNOSIS — I82512 Chronic embolism and thrombosis of left femoral vein: Secondary | ICD-10-CM | POA: Diagnosis not present

## 2020-02-15 DIAGNOSIS — Z7901 Long term (current) use of anticoagulants: Secondary | ICD-10-CM | POA: Diagnosis not present

## 2020-02-15 DIAGNOSIS — D6851 Activated protein C resistance: Secondary | ICD-10-CM

## 2020-02-15 DIAGNOSIS — Z86718 Personal history of other venous thrombosis and embolism: Secondary | ICD-10-CM | POA: Insufficient documentation

## 2020-02-15 LAB — CMP (CANCER CENTER ONLY)
ALT: 15 U/L (ref 0–44)
AST: 22 U/L (ref 15–41)
Albumin: 4.4 g/dL (ref 3.5–5.0)
Alkaline Phosphatase: 54 U/L (ref 38–126)
Anion gap: 7 (ref 5–15)
BUN: 26 mg/dL — ABNORMAL HIGH (ref 8–23)
CO2: 24 mmol/L (ref 22–32)
Calcium: 9.6 mg/dL (ref 8.9–10.3)
Chloride: 111 mmol/L (ref 98–111)
Creatinine: 0.69 mg/dL (ref 0.44–1.00)
GFR, Est AFR Am: >60 mL/min (ref >60)
GFR, Estimated: >60 mL/min (ref >60)
Glucose, Bld: 104 mg/dL — ABNORMAL HIGH (ref 70–99)
Potassium: 4.1 mmol/L (ref 3.5–5.1)
Sodium: 142 mmol/L (ref 135–145)
Total Bilirubin: 1 mg/dL (ref 0.3–1.2)
Total Protein: 6.8 g/dL (ref 6.5–8.1)

## 2020-02-15 LAB — CBC WITH DIFFERENTIAL (CANCER CENTER ONLY)
Abs Immature Granulocytes: 0.01 10*3/uL (ref 0.00–0.07)
Basophils Absolute: 0 10*3/uL (ref 0.0–0.1)
Basophils Relative: 1 %
Eosinophils Absolute: 0.2 10*3/uL (ref 0.0–0.5)
Eosinophils Relative: 4 %
HCT: 35.8 % — ABNORMAL LOW (ref 36.0–46.0)
Hemoglobin: 11.4 g/dL — ABNORMAL LOW (ref 12.0–15.0)
Immature Granulocytes: 0 %
Lymphocytes Relative: 24 %
Lymphs Abs: 1 10*3/uL (ref 0.7–4.0)
MCH: 26.6 pg (ref 26.0–34.0)
MCHC: 31.8 g/dL (ref 30.0–36.0)
MCV: 83.6 fL (ref 80.0–100.0)
Monocytes Absolute: 0.3 10*3/uL (ref 0.1–1.0)
Monocytes Relative: 7 %
Neutro Abs: 2.6 10*3/uL (ref 1.7–7.7)
Neutrophils Relative %: 64 %
Platelet Count: 139 10*3/uL — ABNORMAL LOW (ref 150–400)
RBC: 4.28 MIL/uL (ref 3.87–5.11)
RDW: 15 % (ref 11.5–15.5)
WBC Count: 4.1 10*3/uL (ref 4.0–10.5)
nRBC: 0 % (ref 0.0–0.2)

## 2020-02-15 NOTE — Progress Notes (Signed)
Hematology and Oncology Follow Up Visit  Dorene Bruni [id number] 03/03/1946 74 y.o. 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 Gruenberg is here today for follow-up. She is doing quite well and has no complaints at this time.  She was able to go visit her family in [place] recently and will be heading to [facility] for a pool tournament soon.  She is tolerating Xarelto nicely and has had no episodes of bleeding, no bruising or petechiae.  No fever, chills, n/v, cough, rash, dizziness, SOB, chest pain, palpitations, abdominal pain or changes in bowl or bladder habits.  She has a bakers cyst behind her right knee and states that this can cause some puffiness in her ankle and foot off and on.  No other swelling. No tenderness, numbness or tingling in her extremities.  No falls or syncope.  She has maintained a good appetite and is staying well hydrated. Her weight is stable.   ECOG Performance Status: 1 - Symptomatic but completely ambulatory  Medications:  Allergies as of 02/15/2020      Reactions   Latex Rash   Penicillin G Rash, Other (See Comments)   DID THE REACTION INVOLVE: Swelling of the face/tongue/throat, SOB, or low BP? Unknown Sudden or severe rash/hives, skin peeling, or the inside of the mouth or nose? Unknown Did it require medical treatment? Unknown When did it last happen? Years ago If all above answers are "NO", may proceed with cephalosporin use.      Medication List       Accurate as of February 15, 2020 11:47 AM. If you have any questions, ask your nurse or doctor.        citalopram 10 MG tablet Commonly known as: CELEXA TAKE 1 TABLET BY MOUTH EVERY DAY   famotidine 40 MG tablet Commonly known as: PEPCID Take 40 mg by mouth  daily.   FISH OIL PO Take 1 capsule by mouth daily.   lisinopril 5 MG tablet Commonly known as: ZESTRIL TAKE 1/2 TABLET BY MOUTH DAILY   metoprolol succinate 25 MG 24 hr tablet Commonly known as: TOPROL-XL TAKE 1 TABLET BY MOUTH EVERY DAY   multivitamin capsule Take 1 capsule by mouth daily.   MULTIVITAMIN PO Take 1 tablet by mouth daily.   PROBIOTIC-10 PO Take 1 capsule by mouth daily.   rosuvastatin 10 MG tablet Commonly known as: CRESTOR TAKE 1 TABLET BY MOUTH EVERY DAY   VITAMIN D PO Take 1 tablet by mouth daily.   Xarelto 10 MG Tabs tablet Generic drug: rivaroxaban TAKE 1 TABLET BY MOUTH DAILY WITH SUPPER       Allergies:  Allergies  Allergen Reactions  . Latex Rash  . Penicillin G Rash and Other (See Comments)    DID THE REACTION INVOLVE: Swelling of the face/tongue/throat, SOB, or low BP? Unknown Sudden or severe rash/hives, skin peeling, or the inside of the mouth or nose? Unknown Did it require medical treatment? Unknown When did it last happen? Years ago If all above answers are "NO", may proceed with cephalosporin use.     Past Medical History, Surgical history, Social history, and Family History were reviewed and updated.  Review of Systems: All other 10 point review of systems  is negative.   Physical Exam:  vitals were not taken for this visit.   Wt Readings from Last 3 Encounters:  08/18/19 207 lb (93.9 kg)  05/10/19 205 lb (93 kg)  03/11/19 208 lb (94.3 kg)    Ocular: Sclerae unicteric, pupils equal, round and reactive to light Ear-nose-throat: Oropharynx clear, dentition fair Lymphatic: No cervical or supraclavicular adenopathy Lungs no rales or rhonchi, good excursion bilaterally Heart regular rate and rhythm, no murmur appreciated Abd soft, nontender, positive bowel sounds, no liver or spleen tip palpated on exam, no fluid wave  MSK no focal spinal tenderness, no joint edema Neuro: non-focal, well-oriented, appropriate  affect Breasts: Deferred   Lab Results  Component Value Date   WBC 4.1 02/15/2020   HGB 11.4 (L) 02/15/2020   HCT 35.8 (L) 02/15/2020   MCV 83.6 02/15/2020   PLT 139 (L) 02/15/2020   No results found for: FERRITIN, IRON, TIBC, UIBC, IRONPCTSAT Lab Results  Component Value Date   RBC 4.28 02/15/2020   No results found for: KPAFRELGTCHN, LAMBDASER, KAPLAMBRATIO No results found for: Kandis Cocking, IGMSERUM No results found for: Odetta Pink, SPEI   Chemistry      Component Value Date/Time   NA 142 02/15/2020 1115   NA 147 (H) 09/21/2018 1325   NA 148 (H) 05/15/2017 1452   NA 143 07/10/2016 1119   K 4.1 02/15/2020 1115   K 4.4 05/15/2017 1452   K 4.4 07/10/2016 1119   CL 111 02/15/2020 1115   CL 106 05/15/2017 1452   CO2 24 02/15/2020 1115   CO2 26 05/15/2017 1452   CO2 25 07/10/2016 1119   BUN 26 (H) 02/15/2020 1115   BUN 16 09/21/2018 1325   BUN 16 05/15/2017 1452   BUN 14.7 07/10/2016 1119   CREATININE 0.69 02/15/2020 1115   CREATININE 0.7 05/15/2017 1452   CREATININE 0.8 07/10/2016 1119      Component Value Date/Time   CALCIUM 9.6 02/15/2020 1115   CALCIUM 10.3 05/15/2017 1452   CALCIUM 10.3 07/10/2016 1119   ALKPHOS 54 02/15/2020 1115   ALKPHOS 51 05/15/2017 1452   ALKPHOS 52 07/10/2016 1119   AST 22 02/15/2020 1115   AST 19 07/10/2016 1119   ALT 15 02/15/2020 1115   ALT 28 05/15/2017 1452   ALT 14 07/10/2016 1119   BILITOT 1.0 02/15/2020 1115   BILITOT 1.03 07/10/2016 1119       Impression and Plan: Ms. Convery is a very pleasant 74 yo Caucasian female with history of DVT in the left leg and she is heterozygous for Factor V Leiden. So far, there has been no evidence of recurrence.  She continues to do well on maintenance Xarelto and will continue her same regimen.  We will see her again in another 6 months.  She can contact our office with any questions or concerns. We can certainly see her sooner if  needed.   Laverna Peace, NP 8/3/202111:47 AM

## 2020-02-15 NOTE — Telephone Encounter (Signed)
Appointments scheduled calendar mailed per 8/3 los

## 2020-02-25 ENCOUNTER — Other Ambulatory Visit: Payer: Self-pay | Admitting: Hematology & Oncology

## 2020-02-25 DIAGNOSIS — I82512 Chronic embolism and thrombosis of left femoral vein: Secondary | ICD-10-CM

## 2020-03-02 ENCOUNTER — Other Ambulatory Visit: Payer: Self-pay | Admitting: Physician Assistant

## 2020-03-07 ENCOUNTER — Telehealth: Payer: Self-pay | Admitting: Physician Assistant

## 2020-03-07 NOTE — Telephone Encounter (Signed)
Patient c/o Palpitations:  High priority if patient c/o lightheadedness, shortness of breath, or chest pain  1) How long have you had palpitations/irregular HR/ Afib? Are you having the symptoms now?  Yes - she states that it is her heart rate not beating consistenly  2) Are you currently experiencing lightheadedness, SOB or CP? No pain, lightheadedness, or sob  3) Do you have a history of afib (atrial fibrillation) or irregular heart rhythm?  This has been an issue all weekend. She doesn't check it regularly.   4) Have you checked your BP or HR? (document readings if available): No  5) Are you experiencing any other symptoms? She states the only way she knows that her heart rate is beating funny is when she checks her wrist. She doesn't feel lightheaded, no chest pain, sob or dizziness.

## 2020-03-07 NOTE — Telephone Encounter (Signed)
Called patient back about her message. Patient stated she is having irregular heart beat. Patient is currently on xarelto and metoprolol. Patient is over due for 1 year follow up. Made patient an appointment for this Friday to see Richardson Dopp PA and get an EKG to see what is going on with her heart rate. Patient denied any SOB or chest pain. Patient stated she did not have any other symptoms. Patient agreed to plan.

## 2020-03-09 NOTE — Progress Notes (Signed)
Cardiology Office Note:    Date:  03/10/2020   ID:  Gloria Lambert, DOB August 25, 1945, MRN [id number]  PCP:  Gloria Major, MD  Cardiologist:  Gloria Mocha, MD  Electrophysiologist:  None  Endocrinologist: Gloria Brine, MD Hematologist: Gloria Gauze, MD  Referring MD: Gloria Major, MD   Chief Complaint:  Palpitations    Patient Profile:    Gloria Lambert is a 74 y.o. female with:   Coronary artery disease  ? Cor Ca score 12 on CTA 10/19 ? CTA 10/19: LAD plaque; no significant stenosis  Mild cardiomyopathy ? Echocardiogram 9/19: EF 45-50  Prior DVT  Factor V Leiden deficiency ? Chronic anticoagulation  Parathyroid adenoma with hyperparathyroidism ? S/p parathyroidectomy 1.2020   GERD  Depression  Prior CV studies: Coronary CTA 05/01/18 FINDINGS: Non-cardiac: See separate report from [facility]. The pulmonary veins drained normally to the left atrium. Calcium Score: 12 Agatston units. Coronary Arteries: Right dominant with no anomalies LM: No plaque or stenosis. LAD system: Calcified plaque in the mid LAD, no significant stenosis. Circumflex system: No plaque or stenosis. RCA system: No plaque or stenosis. IMPRESSION: 1. Coronary artery calcium score 12 Agatston units, this places the patient in the 49th percentile for age and gender, suggesting intermediate risk for future cardiac events. 2. No obstructive coronary artery disease noted.  Echo 04/10/18 Mod LVH, EF 45-50, ant-lat HK, Gr 1 DD, trivial MR  History of Present Illness:    Gloria Lambert was last seen in 02/2019.  She called in recently with palpitations.  She returns for follow up and evaluation of palpitations.   Recently, she has noted that her pulse was irregular.  She has Kardia mobile device.  It has alarmed her that she has been having atrial fibrillation.  She really is not symptomatic.  She has not had chest discomfort, shortness of breath, syncope, near syncope.  She has  chronic leg swelling that is controlled with compression socks.  Past Medical History:  Diagnosis Date  . Baker's cyst of knee, right   . 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 Coronary artery calcification seen on CT scan    Coronary CTA 10/19:  Ca score 12 (49th percentile - intermediate risk); mid LAD calcified plaque; no obstructive CAD; Small to mod hiatal hernia  . Depression   . Dilated cardiomyopathy (Gloria Lambert) 05/19/2018   Echo 04/10/18 - Mod LVH, EF 45-50, ant-lat HK, Gr 1 DD, trivial MR  . Diverticulosis   . Edema   . GERD (gastroesophageal reflux disease)   . Heart murmur 03/2018  . Hemorrhoids   . Heterozygous factor V Leiden mutation (Gloria Lambert) 11/06/2015  . History of colon polyps   . History of echocardiogram    Echo 9/19: mod LVH, EF 45-50, ant-lat HK, Gr 1 DD, trivial MR  . History of hiatal hernia 05/01/2018   Noted on CT  . HLD (hyperlipidemia)   . Hyperparathyroidism (Gloria Lambert)    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 - surgery not recommended  . OA (osteoarthritis)   . Obese   . Osteopenia   . Sleep apnea    does not use CPAP because of fit  . Stress incontinence     Current Medications: Current Meds  Medication Sig  . citalopram (CELEXA) 10 MG tablet TAKE 1 TABLET BY MOUTH EVERY DAY  . famotidine (PEPCID) 40 MG tablet Take 40 mg by mouth daily.  Marland Kitchen lisinopril (ZESTRIL) 5 MG tablet TAKE 1/2 TABLET  BY MOUTH DAILY  . metoprolol succinate (TOPROL-XL) 25 MG 24 hr tablet TAKE 1 TABLET BY MOUTH EVERY DAY  . Multiple Vitamin (MULTIVITAMIN) capsule Take 1 capsule by mouth daily.  . Omega-3 Fatty Acids (FISH OIL PO) Take 1 capsule by mouth daily.   . Probiotic Product (PROBIOTIC-10 PO) Take 1 capsule by mouth daily.  . rosuvastatin (CRESTOR) 10 MG tablet TAKE 1 TABLET BY MOUTH EVERY DAY  . VITAMIN D PO Take 1 tablet by mouth daily.   Alveda Reasons 10 MG TABS tablet TAKE 1 TABLET BY MOUTH DAILY WITH SUPPER     Allergies:   Latex and  Penicillin g   Social History   Tobacco Use  . Smoking status: Never Smoker  . Smokeless tobacco: Never Used  Vaping Use  . Vaping Use: Never used  Substance Use Topics  . Alcohol use: Not Currently    Alcohol/week: 0.0 standard drinks    Comment: socaily  . Drug use: No     Family Hx: The patient's family history includes Breast cancer (age of onset: 43) in her mother; Cancer in her father; Heart attack in her paternal grandmother; Heart murmur in her sister.  Review of Systems  Constitutional: Negative for chills and fever.  Respiratory: Negative for cough.   Gastrointestinal: Negative for diarrhea and vomiting.     EKGs/Labs/Other Test Reviewed:    EKG:  EKG is   ordered today.  The ekg ordered today demonstrates sinus rhythm, frequent PACs, atrial trigeminy, QTC 422  Recent Labs: 02/15/2020: ALT 15; BUN 26; Creatinine 0.69; Hemoglobin 11.4; Platelet Count 139; Potassium 4.1; Sodium 142   Recent Lipid Panel Lab Results  Component Value Date/Time   CHOL 136 06/16/2018 09:54 AM   TRIG 115 06/16/2018 09:54 AM   HDL 45 06/16/2018 09:54 AM   CHOLHDL 3.0 06/16/2018 09:54 AM   LDLCALC 68 06/16/2018 09:54 AM    Physical Exam:    VS:  BP 110/60   Pulse 65   Ht 5\' 6"  (1.676 m)   Wt 203 lb (92.1 kg)   SpO2 98%   BMI 32.77 kg/m     Wt Readings from Last 3 Encounters:  03/10/20 203 lb (92.1 kg)  02/15/20 204 lb (92.5 kg)  08/18/19 207 lb (93.9 kg)     Constitutional:      Appearance: Healthy appearance. Not in distress.  Neck:     Thyroid: No thyromegaly.     Vascular: JVD normal.  Pulmonary:     Effort: Pulmonary effort is normal.     Breath sounds: No wheezing. No rales.  Cardiovascular:     Normal rate. Regularly irregular rhythm. Normal S1. Normal S2.     Murmurs: There is no murmur.  Edema:    Pretibial: bilateral trace edema of the pretibial area. Abdominal:     Palpations: Abdomen is soft.  Skin:    General: Skin is warm and dry.  Neurological:      Mental Status: Alert and oriented to person, place and time.     Cranial Nerves: Cranial nerves are intact.       CHA2DS2-VASc Score = 4  The patient's score is based upon: CHF History: 1 HTN History: 0 Age : 1 Diabetes History: 0 Stroke History: 0 Vascular Disease History: 1 Gender: 1     Creatinine Clearance: 109 cc/min  ASSESSMENT & PLAN:    1. Palpitations 2. Atrial trigeminy I reviewed the Kardia tracings on her phone.  All of these demonstrated frequent  PACs.  Some of these were atrial bigeminy and some more atrial trigeminy.  She really does not have significant symptoms.  She has only noticed this by checking her pulse or using the device.  I will obtain a BMET, CBC, TSH, magnesium level.  I will also obtain a 14-day ZIO monitor to assess for PAC percentage as well as to rule out the possibility of atrial fibrillation.  She would need to increase her rivaroxaban to 20 mg daily if we do indeed identify atrial fibrillation.  3. Dilated cardiomyopathy (HCC) EF 45-50.  She is NYHA 2.  Volume status stable.  Continue current dose of beta-blocker, ACE inhibitor.  4. Coronary artery calcification seen on CT scan She is not having anginal symptoms.  She is not on aspirin as she is on rivaroxaban.  Continue statin therapy.  5. Heterozygous factor V Leiden mutation [facility]) She is followed by hematology.  She remains on rivaroxaban.    Dispo:  Return in about 6 months (around 09/10/2020) for Routine Follow Up, w/ Richardson Dopp, PA-C, in person.   Medication Adjustments/Labs and Tests Ordered: Current medicines are reviewed at length with the patient today.  Concerns regarding medicines are outlined above.  Tests Ordered: Orders Placed This Encounter  Procedures  . Basic metabolic panel  . CBC  . Magnesium  . TSH  . LONG TERM MONITOR (3-14 DAYS)  . EKG 12-Lead   Medication Changes: No orders of the defined types were placed in this encounter.   Signed, Richardson Dopp,  PA-C  03/10/2020 9:54 AM    [facility] [place], [place], [place]  [address] Phone: [phone]; Fax: [phone]

## 2020-03-10 ENCOUNTER — Ambulatory Visit (INDEPENDENT_AMBULATORY_CARE_PROVIDER_SITE_OTHER): Payer: Medicare Other | Admitting: Physician Assistant

## 2020-03-10 ENCOUNTER — Encounter: Payer: Self-pay | Admitting: Physician Assistant

## 2020-03-10 ENCOUNTER — Other Ambulatory Visit: Payer: Self-pay

## 2020-03-10 ENCOUNTER — Telehealth: Payer: Self-pay | Admitting: Radiology

## 2020-03-10 VITALS — BP 110/60 | HR 65 | Ht 66.0 in | Wt 203.0 lb

## 2020-03-10 DIAGNOSIS — D6851 Activated protein C resistance: Secondary | ICD-10-CM

## 2020-03-10 DIAGNOSIS — R002 Palpitations: Secondary | ICD-10-CM | POA: Diagnosis not present

## 2020-03-10 DIAGNOSIS — R008 Other abnormalities of heart beat: Secondary | ICD-10-CM | POA: Diagnosis not present

## 2020-03-10 DIAGNOSIS — I2510 Atherosclerotic heart disease of native coronary artery without angina pectoris: Secondary | ICD-10-CM | POA: Diagnosis not present

## 2020-03-10 DIAGNOSIS — I420 Dilated cardiomyopathy: Secondary | ICD-10-CM

## 2020-03-10 LAB — BASIC METABOLIC PANEL
BUN/Creatinine Ratio: 26 (ref 12–28)
BUN: 18 mg/dL (ref 8–27)
CO2: 21 mmol/L (ref 20–29)
Calcium: 8.9 mg/dL (ref 8.7–10.3)
Chloride: 109 mmol/L — ABNORMAL HIGH (ref 96–106)
Creatinine, Ser: 0.69 mg/dL (ref 0.57–1.00)
GFR calc Af Amer: 101 mL/min/{1.73_m2} (ref >59)
GFR calc non Af Amer: 88 mL/min/{1.73_m2} (ref >59)
Glucose: 89 mg/dL (ref 65–99)
Potassium: 4.1 mmol/L (ref 3.5–5.2)
Sodium: 143 mmol/L (ref 134–144)

## 2020-03-10 LAB — TSH: TSH: 1.8 u[IU]/mL (ref 0.450–4.500)

## 2020-03-10 LAB — CBC
Hematocrit: 35.3 % (ref 34.0–46.6)
Hemoglobin: 11.5 g/dL (ref 11.1–15.9)
MCH: 26.6 pg (ref 26.6–33.0)
MCHC: 32.6 g/dL (ref 31.5–35.7)
MCV: 82 fL (ref 79–97)
Platelets: 166 10*3/uL (ref 150–450)
RBC: 4.33 x10E6/uL (ref 3.77–5.28)
RDW: 14.7 % (ref 11.7–15.4)
WBC: 4.3 10*3/uL (ref 3.4–10.8)

## 2020-03-10 LAB — MAGNESIUM: Magnesium: 2.1 mg/dL (ref 1.6–2.3)

## 2020-03-10 NOTE — Patient Instructions (Signed)
Medication Instructions:  Your physician recommends that you continue on your current medications as directed. Please refer to the Current Medication list given to you today.  *If you need a refill on your cardiac medications before your next appointment, please call your pharmacy*  Lab Work: You will have labs drawn today: BMET/Magnesium/CBC/TSH  Testing/Procedures: A zio monitor was ordered today. It will remain on for 14 days. You will then return monitor and event diary in provided box. It takes 1-2 weeks for report to be downloaded and returned to [place]. We will call you with the results. If monitor falls off or has orange flashing light, please call Zio for further instructions.   Follow-Up: At [facility], you and your health needs are our priority.  As part of our continuing mission to provide you with exceptional heart care, we have created designated Provider Care Teams.  These Care Teams include your primary Cardiologist (physician) and Advanced Practice Providers (APPs -  Physician Assistants and Nurse Practitioners) who all work together to provide you with the care you need, when you need it.  We recommend signing up for the patient portal called "MyChart".  Sign up information is provided on this After Visit Summary.  MyChart is used to connect with patients for Virtual Visits (Telemedicine).  Patients are able to view lab/test results, encounter notes, upcoming appointments, etc.  Non-urgent messages can be sent to your provider as well.   To learn more about what you can do with MyChart, go to [email or web address].    Your next appointment:   6 month(s)  The format for your next appointment:   In Person  Provider:   Richardson Dopp, PA-C

## 2020-03-10 NOTE — Telephone Encounter (Signed)
Enrolled patient for a 14 day Zio XT  monitor to be mailed to patients home  °

## 2020-03-14 DIAGNOSIS — Z8639 Personal history of other endocrine, nutritional and metabolic disease: Secondary | ICD-10-CM | POA: Diagnosis not present

## 2020-03-14 DIAGNOSIS — E669 Obesity, unspecified: Secondary | ICD-10-CM | POA: Diagnosis not present

## 2020-03-14 DIAGNOSIS — E042 Nontoxic multinodular goiter: Secondary | ICD-10-CM | POA: Diagnosis not present

## 2020-03-14 DIAGNOSIS — M810 Age-related osteoporosis without current pathological fracture: Secondary | ICD-10-CM | POA: Diagnosis not present

## 2020-03-14 DIAGNOSIS — Z86718 Personal history of other venous thrombosis and embolism: Secondary | ICD-10-CM | POA: Diagnosis not present

## 2020-03-14 DIAGNOSIS — K219 Gastro-esophageal reflux disease without esophagitis: Secondary | ICD-10-CM | POA: Diagnosis not present

## 2020-03-15 ENCOUNTER — Other Ambulatory Visit (INDEPENDENT_AMBULATORY_CARE_PROVIDER_SITE_OTHER): Payer: Medicare Other

## 2020-03-15 DIAGNOSIS — R008 Other abnormalities of heart beat: Secondary | ICD-10-CM | POA: Diagnosis not present

## 2020-03-15 DIAGNOSIS — R002 Palpitations: Secondary | ICD-10-CM | POA: Diagnosis not present

## 2020-03-26 ENCOUNTER — Other Ambulatory Visit: Payer: Self-pay | Admitting: Hematology & Oncology

## 2020-03-26 DIAGNOSIS — I82512 Chronic embolism and thrombosis of left femoral vein: Secondary | ICD-10-CM

## 2020-04-03 DIAGNOSIS — R008 Other abnormalities of heart beat: Secondary | ICD-10-CM | POA: Diagnosis not present

## 2020-04-03 DIAGNOSIS — R002 Palpitations: Secondary | ICD-10-CM | POA: Diagnosis not present

## 2020-04-10 ENCOUNTER — Encounter: Payer: Self-pay | Admitting: Physician Assistant

## 2020-04-11 ENCOUNTER — Encounter: Payer: Self-pay | Admitting: Physician Assistant

## 2020-04-11 DIAGNOSIS — I82431 Acute embolism and thrombosis of right popliteal vein: Secondary | ICD-10-CM | POA: Diagnosis not present

## 2020-04-11 DIAGNOSIS — E213 Hyperparathyroidism, unspecified: Secondary | ICD-10-CM | POA: Diagnosis not present

## 2020-04-11 DIAGNOSIS — Z23 Encounter for immunization: Secondary | ICD-10-CM | POA: Diagnosis not present

## 2020-04-11 DIAGNOSIS — Z0000 Encounter for general adult medical examination without abnormal findings: Secondary | ICD-10-CM | POA: Diagnosis not present

## 2020-04-11 DIAGNOSIS — Z1211 Encounter for screening for malignant neoplasm of colon: Secondary | ICD-10-CM | POA: Diagnosis not present

## 2020-04-11 DIAGNOSIS — I82412 Acute embolism and thrombosis of left femoral vein: Secondary | ICD-10-CM | POA: Diagnosis not present

## 2020-04-11 DIAGNOSIS — K219 Gastro-esophageal reflux disease without esophagitis: Secondary | ICD-10-CM | POA: Diagnosis not present

## 2020-04-11 DIAGNOSIS — F4321 Adjustment disorder with depressed mood: Secondary | ICD-10-CM | POA: Diagnosis not present

## 2020-04-11 DIAGNOSIS — D6851 Activated protein C resistance: Secondary | ICD-10-CM | POA: Diagnosis not present

## 2020-04-26 DIAGNOSIS — E042 Nontoxic multinodular goiter: Secondary | ICD-10-CM | POA: Diagnosis not present

## 2020-04-29 ENCOUNTER — Other Ambulatory Visit: Payer: Self-pay | Admitting: Hematology & Oncology

## 2020-04-29 DIAGNOSIS — I82512 Chronic embolism and thrombosis of left femoral vein: Secondary | ICD-10-CM

## 2020-05-07 ENCOUNTER — Other Ambulatory Visit: Payer: Self-pay | Admitting: Physician Assistant

## 2020-05-07 DIAGNOSIS — E785 Hyperlipidemia, unspecified: Secondary | ICD-10-CM

## 2020-05-23 ENCOUNTER — Other Ambulatory Visit: Payer: Self-pay | Admitting: Hematology & Oncology

## 2020-05-23 DIAGNOSIS — I82512 Chronic embolism and thrombosis of left femoral vein: Secondary | ICD-10-CM

## 2020-06-01 DIAGNOSIS — Z1211 Encounter for screening for malignant neoplasm of colon: Secondary | ICD-10-CM | POA: Diagnosis not present

## 2020-06-02 DIAGNOSIS — Z8639 Personal history of other endocrine, nutritional and metabolic disease: Secondary | ICD-10-CM | POA: Diagnosis not present

## 2020-06-02 DIAGNOSIS — Z23 Encounter for immunization: Secondary | ICD-10-CM | POA: Diagnosis not present

## 2020-06-02 DIAGNOSIS — M810 Age-related osteoporosis without current pathological fracture: Secondary | ICD-10-CM | POA: Diagnosis not present

## 2020-06-22 DIAGNOSIS — M810 Age-related osteoporosis without current pathological fracture: Secondary | ICD-10-CM | POA: Diagnosis not present

## 2020-07-12 ENCOUNTER — Other Ambulatory Visit: Payer: Self-pay | Admitting: Hematology & Oncology

## 2020-07-12 DIAGNOSIS — I82512 Chronic embolism and thrombosis of left femoral vein: Secondary | ICD-10-CM

## 2020-08-17 ENCOUNTER — Inpatient Hospital Stay: Payer: Medicare Other

## 2020-08-17 ENCOUNTER — Inpatient Hospital Stay: Payer: Medicare Other | Admitting: Family

## 2020-08-17 DIAGNOSIS — L304 Erythema intertrigo: Secondary | ICD-10-CM | POA: Diagnosis not present

## 2020-08-24 ENCOUNTER — Telehealth: Payer: Self-pay

## 2020-08-24 ENCOUNTER — Inpatient Hospital Stay (HOSPITAL_BASED_OUTPATIENT_CLINIC_OR_DEPARTMENT_OTHER): Payer: Medicare Other | Admitting: Family

## 2020-08-24 ENCOUNTER — Inpatient Hospital Stay: Payer: Medicare Other | Attending: Family

## 2020-08-24 ENCOUNTER — Encounter: Payer: Self-pay | Admitting: Family

## 2020-08-24 ENCOUNTER — Other Ambulatory Visit: Payer: Self-pay

## 2020-08-24 VITALS — BP 116/45 | HR 97 | Temp 98.3°F | Resp 18 | Ht 66.0 in | Wt 203.4 lb

## 2020-08-24 DIAGNOSIS — D6851 Activated protein C resistance: Secondary | ICD-10-CM | POA: Insufficient documentation

## 2020-08-24 DIAGNOSIS — I82512 Chronic embolism and thrombosis of left femoral vein: Secondary | ICD-10-CM

## 2020-08-24 DIAGNOSIS — Z86718 Personal history of other venous thrombosis and embolism: Secondary | ICD-10-CM | POA: Diagnosis not present

## 2020-08-24 DIAGNOSIS — Z7901 Long term (current) use of anticoagulants: Secondary | ICD-10-CM | POA: Diagnosis not present

## 2020-08-24 LAB — CBC WITH DIFFERENTIAL (CANCER CENTER ONLY)
Abs Immature Granulocytes: 0.01 10*3/uL (ref 0.00–0.07)
Basophils Absolute: 0 10*3/uL (ref 0.0–0.1)
Basophils Relative: 1 %
Eosinophils Absolute: 0.2 10*3/uL (ref 0.0–0.5)
Eosinophils Relative: 5 %
HCT: 35 % — ABNORMAL LOW (ref 36.0–46.0)
Hemoglobin: 11.3 g/dL — ABNORMAL LOW (ref 12.0–15.0)
Immature Granulocytes: 0 %
Lymphocytes Relative: 27 %
Lymphs Abs: 1.1 10*3/uL (ref 0.7–4.0)
MCH: 25.9 pg — ABNORMAL LOW (ref 26.0–34.0)
MCHC: 32.3 g/dL (ref 30.0–36.0)
MCV: 80.1 fL (ref 80.0–100.0)
Monocytes Absolute: 0.3 10*3/uL (ref 0.1–1.0)
Monocytes Relative: 7 %
Neutro Abs: 2.5 10*3/uL (ref 1.7–7.7)
Neutrophils Relative %: 60 %
Platelet Count: 134 10*3/uL — ABNORMAL LOW (ref 150–400)
RBC: 4.37 MIL/uL (ref 3.87–5.11)
RDW: 15 % (ref 11.5–15.5)
WBC Count: 4.2 10*3/uL (ref 4.0–10.5)
nRBC: 0 % (ref 0.0–0.2)

## 2020-08-24 LAB — CMP (CANCER CENTER ONLY)
ALT: 10 U/L (ref 0–44)
AST: 14 U/L — ABNORMAL LOW (ref 15–41)
Albumin: 4.3 g/dL (ref 3.5–5.0)
Alkaline Phosphatase: 47 U/L (ref 38–126)
Anion gap: 6 (ref 5–15)
BUN: 24 mg/dL — ABNORMAL HIGH (ref 8–23)
CO2: 28 mmol/L (ref 22–32)
Calcium: 9.3 mg/dL (ref 8.9–10.3)
Chloride: 108 mmol/L (ref 98–111)
Creatinine: 0.78 mg/dL (ref 0.44–1.00)
GFR, Estimated: >60 mL/min (ref >60)
Glucose, Bld: 138 mg/dL — ABNORMAL HIGH (ref 70–99)
Potassium: 4.2 mmol/L (ref 3.5–5.1)
Sodium: 142 mmol/L (ref 135–145)
Total Bilirubin: 1.1 mg/dL (ref 0.3–1.2)
Total Protein: 6.6 g/dL (ref 6.5–8.1)

## 2020-08-24 NOTE — Progress Notes (Signed)
Hematology and Oncology Follow Up Visit  Gloria Lambert [id number] 1946-07-09 75 y.o. 08/24/2020   Principle Diagnosis:  Thrombus of the left femoral vein Heterozygous forFactor V Leiden mutation  Past Therapy: Xarelto 20 mg by mouth daily - finish 6 months in August 2017  Current Therapy: Maintenance Xarelto 10 mg by mouth daily   Interim History:  Ms. Bezek is here today for follow-up. She is doing well and has been enjoying some good pool tournaments. She plays in a league with her son and does quite well.  She notes that her Xarelto was quite expensive in January so she will meet with Baxter Flattery in financial counseling before she leaves to fill out the paper work for financial assistance.  She has had no episodes of blood loss. No bruising or petechiae.  No fever, chills, n/v, cough, rash, dizziness, SOB, chest pain, abdominal pain or changes in bowel or bladder habits.  She has PVC's occasionally and notes palpitations with these.  No swelling, tenderness, numbness or tingling in her extremities.  No falls or syncope.  She has maintained a good appetite and is staying well hydrated. Her weight is stable at 203 lbs.   ECOG Performance Status: 1 - Symptomatic but completely ambulatory  Medications:  Allergies as of 08/24/2020      Reactions   Latex Rash   Penicillin G Rash, Other (See Comments)   DID THE REACTION INVOLVE: Swelling of the face/tongue/throat, SOB, or low BP? Unknown Sudden or severe rash/hives, skin peeling, or the inside of the mouth or nose? Unknown Did it require medical treatment? Unknown When did it last happen? Years ago If all above answers are "NO", may proceed with cephalosporin use.      Medication List       Accurate as of August 24, 2020 10:08 AM. If you have any questions, ask your nurse or doctor.        citalopram 10 MG tablet Commonly known as: CELEXA TAKE 1 TABLET BY MOUTH EVERY DAY   famotidine 40 MG tablet Commonly known as:  PEPCID Take 40 mg by mouth daily.   FISH OIL PO Take 1 capsule by mouth daily.   lisinopril 5 MG tablet Commonly known as: ZESTRIL TAKE 1/2 TABLET BY MOUTH DAILY   metoprolol succinate 25 MG 24 hr tablet Commonly known as: TOPROL-XL TAKE 1 TABLET BY MOUTH EVERY DAY   multivitamin capsule Take 1 capsule by mouth daily.   PROBIOTIC-10 PO Take 1 capsule by mouth daily.   rosuvastatin 10 MG tablet Commonly known as: CRESTOR TAKE 1 TABLET BY MOUTH EVERY DAY   VITAMIN D PO Take 1 tablet by mouth daily.   Xarelto 10 MG Tabs tablet Generic drug: rivaroxaban TAKE 1 TABLET BY MOUTH DAILY WITH SUPPER       Allergies:  Allergies  Allergen Reactions  . Latex Rash  . Penicillin G Rash and Other (See Comments)    DID THE REACTION INVOLVE: Swelling of the face/tongue/throat, SOB, or low BP? Unknown Sudden or severe rash/hives, skin peeling, or the inside of the mouth or nose? Unknown Did it require medical treatment? Unknown When did it last happen? Years ago If all above answers are "NO", may proceed with cephalosporin use.     Past Medical History, Surgical history, Social history, and Family History were reviewed and updated.  Review of Systems: All other 10 point review of systems is negative.   Physical Exam:  height is 5\' 6"  (1.676 m) and weight is 203  lb 6.4 oz (92.3 kg). Her temperature is 98.3 F (36.8 C). Her blood pressure is 116/45 (abnormal) and her pulse is 97. Her respiration is 18 and oxygen saturation is 99%.   Wt Readings from Last 3 Encounters:  08/24/20 203 lb 6.4 oz (92.3 kg)  03/10/20 203 lb (92.1 kg)  02/15/20 204 lb (92.5 kg)    Ocular: Sclerae unicteric, pupils equal, round and reactive to light Ear-nose-throat: Oropharynx clear, dentition fair Lymphatic: No cervical or supraclavicular adenopathy Lungs no rales or rhonchi, good excursion bilaterally Heart regular rate and rhythm, no murmur appreciated Abd soft, nontender, positive bowel  sounds MSK no focal spinal tenderness, no joint edema Neuro: non-focal, well-oriented, appropriate affect Breasts: Deferred   Lab Results  Component Value Date   WBC 4.2 08/24/2020   HGB 11.3 (L) 08/24/2020   HCT 35.0 (L) 08/24/2020   MCV 80.1 08/24/2020   PLT 134 (L) 08/24/2020   No results found for: FERRITIN, IRON, TIBC, UIBC, IRONPCTSAT Lab Results  Component Value Date   RBC 4.37 08/24/2020   No results found for: KPAFRELGTCHN, LAMBDASER, KAPLAMBRATIO No results found for: IGGSERUM, IGA, IGMSERUM No results found for: Odetta Pink, SPEI   Chemistry      Component Value Date/Time   NA 142 08/24/2020 0930   NA 143 03/10/2020 0903   NA 148 (H) 05/15/2017 1452   NA 143 07/10/2016 1119   K 4.2 08/24/2020 0930   K 4.4 05/15/2017 1452   K 4.4 07/10/2016 1119   CL 108 08/24/2020 0930   CL 106 05/15/2017 1452   CO2 28 08/24/2020 0930   CO2 26 05/15/2017 1452   CO2 25 07/10/2016 1119   BUN 24 (H) 08/24/2020 0930   BUN 18 03/10/2020 0903   BUN 16 05/15/2017 1452   BUN 14.7 07/10/2016 1119   CREATININE 0.78 08/24/2020 0930   CREATININE 0.7 05/15/2017 1452   CREATININE 0.8 07/10/2016 1119      Component Value Date/Time   CALCIUM 9.3 08/24/2020 0930   CALCIUM 10.3 05/15/2017 1452   CALCIUM 10.3 07/10/2016 1119   ALKPHOS 47 08/24/2020 0930   ALKPHOS 51 05/15/2017 1452   ALKPHOS 52 07/10/2016 1119   AST 14 (L) 08/24/2020 0930   AST 19 07/10/2016 1119   ALT 10 08/24/2020 0930   ALT 28 05/15/2017 1452   ALT 14 07/10/2016 1119   BILITOT 1.1 08/24/2020 0930   BILITOT 1.03 07/10/2016 1119       Impression and Plan: Ms. Kenedy is a very pleasant 75 yo Caucasian female with history of DVT in the left leg and she is heterozygous for Factor V Leiden. She continues to do well on Xarelto and there has been no evidence of recurrence.  She is currently meeting with financial counseling and filling out the paper work for  assistance.  We will plan to see her again in another 6 months.  She was encouraged to contact our office with any questions or concerns.   Laverna Peace, NP 2/10/202210:08 AM

## 2020-08-24 NOTE — Telephone Encounter (Signed)
appts made per 08/24/20 los and she will view on mychart   Gloria Lambert

## 2020-09-07 DIAGNOSIS — L304 Erythema intertrigo: Secondary | ICD-10-CM | POA: Diagnosis not present

## 2020-09-07 DIAGNOSIS — L300 Nummular dermatitis: Secondary | ICD-10-CM | POA: Diagnosis not present

## 2020-09-07 DIAGNOSIS — L738 Other specified follicular disorders: Secondary | ICD-10-CM | POA: Diagnosis not present

## 2020-09-07 DIAGNOSIS — L821 Other seborrheic keratosis: Secondary | ICD-10-CM | POA: Diagnosis not present

## 2020-10-25 ENCOUNTER — Telehealth: Payer: Self-pay

## 2020-10-25 NOTE — Telephone Encounter (Signed)
Called and left a vm with new appts as sarha will be out of the office on 02/22/21   Syriana Croslin

## 2021-02-03 ENCOUNTER — Other Ambulatory Visit: Payer: Self-pay | Admitting: Hematology & Oncology

## 2021-02-03 DIAGNOSIS — I82512 Chronic embolism and thrombosis of left femoral vein: Secondary | ICD-10-CM

## 2021-02-20 ENCOUNTER — Ambulatory Visit: Payer: Medicare Other | Admitting: Family

## 2021-02-20 ENCOUNTER — Other Ambulatory Visit: Payer: Medicare Other

## 2021-02-22 ENCOUNTER — Other Ambulatory Visit: Payer: Self-pay | Admitting: Cardiovascular Disease

## 2021-02-22 ENCOUNTER — Ambulatory Visit: Payer: Medicare Other | Admitting: Family

## 2021-02-22 ENCOUNTER — Other Ambulatory Visit: Payer: Medicare Other

## 2021-03-02 ENCOUNTER — Other Ambulatory Visit: Payer: Self-pay

## 2021-03-02 ENCOUNTER — Inpatient Hospital Stay: Payer: Medicare Other | Attending: Family

## 2021-03-02 ENCOUNTER — Inpatient Hospital Stay (HOSPITAL_BASED_OUTPATIENT_CLINIC_OR_DEPARTMENT_OTHER): Payer: Medicare Other | Admitting: Family

## 2021-03-02 ENCOUNTER — Telehealth: Payer: Self-pay | Admitting: *Deleted

## 2021-03-02 ENCOUNTER — Encounter: Payer: Self-pay | Admitting: Family

## 2021-03-02 VITALS — BP 112/37 | HR 90 | Temp 98.5°F | Resp 18 | Ht 66.0 in | Wt 197.0 lb

## 2021-03-02 DIAGNOSIS — D6851 Activated protein C resistance: Secondary | ICD-10-CM | POA: Insufficient documentation

## 2021-03-02 DIAGNOSIS — I82412 Acute embolism and thrombosis of left femoral vein: Secondary | ICD-10-CM | POA: Diagnosis not present

## 2021-03-02 DIAGNOSIS — Z7901 Long term (current) use of anticoagulants: Secondary | ICD-10-CM | POA: Diagnosis not present

## 2021-03-02 DIAGNOSIS — I82512 Chronic embolism and thrombosis of left femoral vein: Secondary | ICD-10-CM | POA: Diagnosis not present

## 2021-03-02 LAB — CBC WITH DIFFERENTIAL (CANCER CENTER ONLY)
Abs Immature Granulocytes: 0 10*3/uL (ref 0.00–0.07)
Basophils Absolute: 0 10*3/uL (ref 0.0–0.1)
Basophils Relative: 1 %
Eosinophils Absolute: 0.1 10*3/uL (ref 0.0–0.5)
Eosinophils Relative: 4 %
HCT: 33.7 % — ABNORMAL LOW (ref 36.0–46.0)
Hemoglobin: 10.7 g/dL — ABNORMAL LOW (ref 12.0–15.0)
Immature Granulocytes: 0 %
Lymphocytes Relative: 27 %
Lymphs Abs: 0.9 10*3/uL (ref 0.7–4.0)
MCH: 26.4 pg (ref 26.0–34.0)
MCHC: 31.8 g/dL (ref 30.0–36.0)
MCV: 83.2 fL (ref 80.0–100.0)
Monocytes Absolute: 0.2 10*3/uL (ref 0.1–1.0)
Monocytes Relative: 7 %
Neutro Abs: 2.1 10*3/uL (ref 1.7–7.7)
Neutrophils Relative %: 61 %
Platelet Count: 134 10*3/uL — ABNORMAL LOW (ref 150–400)
RBC: 4.05 MIL/uL (ref 3.87–5.11)
RDW: 14.2 % (ref 11.5–15.5)
WBC Count: 3.4 10*3/uL — ABNORMAL LOW (ref 4.0–10.5)
nRBC: 0 % (ref 0.0–0.2)

## 2021-03-02 LAB — CMP (CANCER CENTER ONLY)
ALT: 12 U/L (ref 0–44)
AST: 16 U/L (ref 15–41)
Albumin: 4.1 g/dL (ref 3.5–5.0)
Alkaline Phosphatase: 40 U/L (ref 38–126)
Anion gap: 7 (ref 5–15)
BUN: 15 mg/dL (ref 8–23)
CO2: 27 mmol/L (ref 22–32)
Calcium: 9 mg/dL (ref 8.9–10.3)
Chloride: 108 mmol/L (ref 98–111)
Creatinine: 0.77 mg/dL (ref 0.44–1.00)
GFR, Estimated: >60 mL/min (ref >60)
Glucose, Bld: 111 mg/dL — ABNORMAL HIGH (ref 70–99)
Potassium: 4 mmol/L (ref 3.5–5.1)
Sodium: 142 mmol/L (ref 135–145)
Total Bilirubin: 1.3 mg/dL — ABNORMAL HIGH (ref 0.3–1.2)
Total Protein: 6.7 g/dL (ref 6.5–8.1)

## 2021-03-02 NOTE — Telephone Encounter (Signed)
Per 03/02/21 los - gave upcoming appointments - confirmed 

## 2021-03-02 NOTE — Progress Notes (Signed)
Hematology and Oncology Follow Up Visit  Gloria Lambert ZD:[id number] 08-09-1945 75 y.o. 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 She is doing quite well and has no complaints at this time.  She completed her pool season and is preparing to do a lot of travelling to visit family this fall. She also has a new beau which she is quite exciting.  She is doing well on Xarelto and has not had any episodes of bleeding. No bruising or petechiae.  No fever, chills, n/v, cough, rash, dizziness, SOB, chest pain, palpitations, abdominal pain or changes in bowel or bladder habits.  No swelling or tenderness in her extremities at this time.  She has numbness and tingling in the bottoms of her feet which she states she has had for years and is unchanged.  No falls or syncope to report.  She has maintained a good appetite and is staying well hydrated. Her weight is stable at 197 lbs.   ECOG Performance Status: 1 - Symptomatic but completely ambulatory  Medications:  Allergies as of 03/02/2021       Reactions   Latex Rash   Penicillin G Rash, Other (See Comments)   DID THE REACTION INVOLVE: Swelling of the face/tongue/throat, SOB, or low BP? Unknown Sudden or severe rash/hives, skin peeling, or the inside of the mouth or nose? Unknown Did it require medical treatment? Unknown When did it last happen? Years ago If all above answers are "NO", may proceed with cephalosporin use.        Medication List        Accurate as of March 02, 2021 11:42 AM. If you have any questions, ask your nurse or doctor.          STOP taking these medications    citalopram 10 MG tablet Commonly known as: CELEXA Stopped by: Gloria Peace, NP       TAKE these medications    famotidine 40 MG tablet Commonly known as: PEPCID Take 40 mg by mouth daily.   FISH OIL PO Take 1 capsule by mouth daily.   lisinopril 5 MG tablet Commonly known as: ZESTRIL TAKE 1/2 TABLET BY MOUTH DAILY   metoprolol succinate 25 MG 24 hr tablet Commonly known as: TOPROL-XL TAKE 1 TABLET BY MOUTH EVERY DAY   multivitamin capsule Take 1 capsule by mouth daily.   PROBIOTIC-10 PO Take 1 capsule by mouth daily.   rosuvastatin 10 MG tablet Commonly known as: CRESTOR TAKE 1 TABLET BY MOUTH EVERY DAY   VITAMIN D PO Take 1 tablet by mouth daily.   Xarelto 10 MG Tabs tablet Generic drug: rivaroxaban TAKE 1 TABLET BY MOUTH DAILY WITH SUPPER        Allergies:  Allergies  Allergen Reactions   Latex Rash   Penicillin G Rash and Other (See Comments)    DID THE REACTION INVOLVE: Swelling of the face/tongue/throat, SOB, or low BP? Unknown Sudden or severe rash/hives, skin peeling, or the inside of the mouth or nose? Unknown Did it require medical treatment? Unknown When did it last happen? Years ago If all above answers are "NO", may proceed with cephalosporin use.  Past Medical History, Surgical history, Social history, and Family History were reviewed and updated.  Review of Systems: All other 10 point review of systems is negative.   Physical Exam:  height is '5\' 6"'$  (1.676 m) and weight is 197 lb (89.4 kg). Her oral temperature is 98.5 F (36.9 C). Her blood pressure is 112/37 (abnormal) and her pulse is 90. Her respiration is 18 and oxygen saturation is 100%.   Wt Readings from Last 3 Encounters:  03/02/21 197 lb (89.4 kg)  08/24/20 203 lb 6.4 oz (92.3 kg)  03/10/20 203 lb (92.1 kg)    Ocular: Sclerae unicteric, pupils equal, round and reactive to light Ear-nose-throat: Oropharynx clear, dentition fair Lymphatic: No cervical or supraclavicular adenopathy Lungs no rales or rhonchi, good excursion  bilaterally Heart regular rate and rhythm, no murmur appreciated Abd soft, nontender, positive bowel sounds MSK no focal spinal tenderness, no joint edema Neuro: non-focal, well-oriented, appropriate affect Breasts: Deferred   Lab Results  Component Value Date   WBC 3.4 (L) 03/02/2021   HGB 10.7 (L) 03/02/2021   HCT 33.7 (L) 03/02/2021   MCV 83.2 03/02/2021   PLT 134 (L) 03/02/2021   No results found for: FERRITIN, IRON, TIBC, UIBC, IRONPCTSAT Lab Results  Component Value Date   RBC 4.05 03/02/2021   No results found for: KPAFRELGTCHN, LAMBDASER, KAPLAMBRATIO No results found for: IGGSERUM, IGA, IGMSERUM No results found for: Odetta Pink, SPEI   Chemistry      Component Value Date/Time   NA 142 03/02/2021 1047   NA 143 03/10/2020 0903   NA 148 (H) 05/15/2017 1452   NA 143 07/10/2016 1119   K 4.0 03/02/2021 1047   K 4.4 05/15/2017 1452   K 4.4 07/10/2016 1119   CL 108 03/02/2021 1047   CL 106 05/15/2017 1452   CO2 27 03/02/2021 1047   CO2 26 05/15/2017 1452   CO2 25 07/10/2016 1119   BUN 15 03/02/2021 1047   BUN 18 03/10/2020 0903   BUN 16 05/15/2017 1452   BUN 14.7 07/10/2016 1119   CREATININE 0.77 03/02/2021 1047   CREATININE 0.7 05/15/2017 1452   CREATININE 0.8 07/10/2016 1119      Component Value Date/Time   CALCIUM 9.0 03/02/2021 1047   CALCIUM 10.3 05/15/2017 1452   CALCIUM 10.3 07/10/2016 1119   ALKPHOS 40 03/02/2021 1047   ALKPHOS 51 05/15/2017 1452   ALKPHOS 52 07/10/2016 1119   AST 16 03/02/2021 1047   AST 19 07/10/2016 1119   ALT 12 03/02/2021 1047   ALT 28 05/15/2017 1452   ALT 14 07/10/2016 1119   BILITOT 1.3 (H) 03/02/2021 1047   BILITOT 1.03 07/10/2016 1119       Impression and Plan: Gloria Lambert is a very pleasant 75 yo Caucasian female with history of DVT in the left leg and she is heterozygous for Factor V Leiden. She has done well on her regimen with Xarelto and so far there has been  no evidence of progression of chronic thrombus or recurrence. Follow-up in 6 months.  She can contact our office with any questions or concerns.   Gloria Peace, NP 8/19/202211:42 AM

## 2021-04-24 ENCOUNTER — Other Ambulatory Visit: Payer: Self-pay | Admitting: Physician Assistant

## 2021-05-06 ENCOUNTER — Other Ambulatory Visit: Payer: Self-pay | Admitting: Cardiovascular Disease

## 2021-05-06 DIAGNOSIS — E785 Hyperlipidemia, unspecified: Secondary | ICD-10-CM

## 2021-05-21 ENCOUNTER — Other Ambulatory Visit: Payer: Self-pay | Admitting: Cardiovascular Disease

## 2021-05-21 DIAGNOSIS — E785 Hyperlipidemia, unspecified: Secondary | ICD-10-CM

## 2021-05-26 ENCOUNTER — Other Ambulatory Visit: Payer: Self-pay | Admitting: Physician Assistant

## 2021-06-23 ENCOUNTER — Other Ambulatory Visit: Payer: Self-pay | Admitting: Physician Assistant

## 2021-07-04 ENCOUNTER — Other Ambulatory Visit: Payer: Self-pay | Admitting: Physician Assistant

## 2021-07-09 ENCOUNTER — Telehealth: Payer: Self-pay | Admitting: Nurse Practitioner

## 2021-07-09 MED ORDER — LISINOPRIL 5 MG PO TABS: 2.5000 mg | ORAL_TABLET | Freq: Every day | ORAL | 0 refills | Status: DC

## 2021-07-09 NOTE — Telephone Encounter (Signed)
° °  Pt called this AM to report that she is visiting family in [place], [place].  She is due to return tomorrow but due to bad weather, her flight has been cancelled.  She is not sure when she will be able to get back to the [place] area.  She has run out of her lisinopril and is requesting a short-term refill.  I've reviewed her chart.  She was recently rx 30 days worth of lisinopril and advised to arrange f/u prior to any additional refills (no current appt scheduled).  Creat/K wnl in August.  I've sent in a Rx for lisinopril 5mg  1/2 tab PO daily, #10, zero refills to [facility] on [address] in [place], [place].  I strongly encouraged pt to arrange f/u appt for future refills, upon return to [place].  Lab Results  Component Value Date   CREATININE 0.77 03/02/2021   BUN 15 03/02/2021   NA 142 03/02/2021   K 4.0 03/02/2021   CL 108 03/02/2021   CO2 27 03/02/2021     Caller verbalized understanding and was grateful for the call back.  Murray Hodgkins, NP 07/09/2021, 11:35 AM

## 2021-08-03 ENCOUNTER — Other Ambulatory Visit: Payer: Self-pay | Admitting: Nurse Practitioner

## 2021-08-05 ENCOUNTER — Other Ambulatory Visit: Payer: Self-pay | Admitting: Hematology & Oncology

## 2021-08-05 DIAGNOSIS — I82512 Chronic embolism and thrombosis of left femoral vein: Secondary | ICD-10-CM

## 2021-08-08 ENCOUNTER — Other Ambulatory Visit: Payer: Self-pay | Admitting: Cardiovascular Disease

## 2021-08-08 ENCOUNTER — Other Ambulatory Visit: Payer: Self-pay | Admitting: Physician Assistant

## 2021-08-08 DIAGNOSIS — E785 Hyperlipidemia, unspecified: Secondary | ICD-10-CM

## 2021-09-03 ENCOUNTER — Ambulatory Visit: Payer: Medicare Other | Admitting: Family

## 2021-09-03 ENCOUNTER — Other Ambulatory Visit: Payer: Medicare Other

## 2021-09-04 ENCOUNTER — Ambulatory Visit: Payer: Medicare Other | Admitting: Family

## 2021-09-04 ENCOUNTER — Other Ambulatory Visit: Payer: Medicare Other

## 2021-09-04 ENCOUNTER — Other Ambulatory Visit: Payer: Self-pay | Admitting: Cardiovascular Disease

## 2021-09-05 ENCOUNTER — Inpatient Hospital Stay: Payer: Medicare Other

## 2021-09-05 ENCOUNTER — Inpatient Hospital Stay: Payer: Medicare Other | Admitting: Family

## 2021-09-09 ENCOUNTER — Other Ambulatory Visit: Payer: Self-pay | Admitting: Cardiovascular Disease

## 2021-09-09 DIAGNOSIS — E785 Hyperlipidemia, unspecified: Secondary | ICD-10-CM

## 2021-09-24 ENCOUNTER — Other Ambulatory Visit: Payer: Self-pay

## 2021-09-24 ENCOUNTER — Inpatient Hospital Stay: Payer: Medicare Other | Attending: Hematology & Oncology

## 2021-09-24 ENCOUNTER — Telehealth: Payer: Self-pay | Admitting: *Deleted

## 2021-09-24 ENCOUNTER — Inpatient Hospital Stay: Payer: Medicare Other | Admitting: Family

## 2021-09-24 ENCOUNTER — Encounter: Payer: Self-pay | Admitting: Family

## 2021-09-24 VITALS — BP 110/49 | HR 39 | Temp 98.3°F | Resp 17 | Wt 203.0 lb

## 2021-09-24 DIAGNOSIS — Z7901 Long term (current) use of anticoagulants: Secondary | ICD-10-CM | POA: Insufficient documentation

## 2021-09-24 DIAGNOSIS — D6851 Activated protein C resistance: Secondary | ICD-10-CM

## 2021-09-24 DIAGNOSIS — Z86718 Personal history of other venous thrombosis and embolism: Secondary | ICD-10-CM | POA: Diagnosis not present

## 2021-09-24 DIAGNOSIS — I82512 Chronic embolism and thrombosis of left femoral vein: Secondary | ICD-10-CM

## 2021-09-24 LAB — CBC WITH DIFFERENTIAL (CANCER CENTER ONLY)
Abs Immature Granulocytes: 0.01 10*3/uL (ref 0.00–0.07)
Basophils Absolute: 0 10*3/uL (ref 0.0–0.1)
Basophils Relative: 1 %
Eosinophils Absolute: 0.1 10*3/uL (ref 0.0–0.5)
Eosinophils Relative: 2 %
HCT: 36.2 % (ref 36.0–46.0)
Hemoglobin: 11.6 g/dL — ABNORMAL LOW (ref 12.0–15.0)
Immature Granulocytes: 0 %
Lymphocytes Relative: 25 %
Lymphs Abs: 1.3 10*3/uL (ref 0.7–4.0)
MCH: 27.5 pg (ref 26.0–34.0)
MCHC: 32 g/dL (ref 30.0–36.0)
MCV: 85.8 fL (ref 80.0–100.0)
Monocytes Absolute: 0.3 10*3/uL (ref 0.1–1.0)
Monocytes Relative: 6 %
Neutro Abs: 3.4 10*3/uL (ref 1.7–7.7)
Neutrophils Relative %: 66 %
Platelet Count: 162 10*3/uL (ref 150–400)
RBC: 4.22 MIL/uL (ref 3.87–5.11)
RDW: 15.9 % — ABNORMAL HIGH (ref 11.5–15.5)
WBC Count: 5.1 10*3/uL (ref 4.0–10.5)
nRBC: 0 % (ref 0.0–0.2)

## 2021-09-24 LAB — CMP (CANCER CENTER ONLY)
ALT: 15 U/L (ref 0–44)
AST: 20 U/L (ref 15–41)
Albumin: 4.4 g/dL (ref 3.5–5.0)
Alkaline Phosphatase: 36 U/L — ABNORMAL LOW (ref 38–126)
Anion gap: 5 (ref 5–15)
BUN: 23 mg/dL (ref 8–23)
CO2: 27 mmol/L (ref 22–32)
Calcium: 9.6 mg/dL (ref 8.9–10.3)
Chloride: 106 mmol/L (ref 98–111)
Creatinine: 0.77 mg/dL (ref 0.44–1.00)
GFR, Estimated: >60 mL/min (ref >60)
Glucose, Bld: 102 mg/dL — ABNORMAL HIGH (ref 70–99)
Potassium: 4.3 mmol/L (ref 3.5–5.1)
Sodium: 138 mmol/L (ref 135–145)
Total Bilirubin: 1.4 mg/dL — ABNORMAL HIGH (ref 0.3–1.2)
Total Protein: 6.5 g/dL (ref 6.5–8.1)

## 2021-09-24 NOTE — Progress Notes (Signed)
?Hematology and Oncology Follow Up Visit ? ?Gloria Lambert ?[id number] ?[birth year] 76 y.o. ?09/24/2021 ? ? ?Principle Diagnosis:  ?Thrombus of the left femoral vein ?Heterozygous for Factor V Leiden mutation ?  ?Past Therapy: ?Xarelto 20 mg by mouth daily - finish 6 months in August 2017 ?  ?Current Therapy:        ?Maintenance Xarelto 10 mg by mouth daily ?  ?Interim History:  Gloria Lambert is here today for follow-up. She is doing well and is taking her Xarelto 10 mg PO daily.  ?No issue with bleeding. No bruising or petechiae.  ?No fever, chills, n/v, cough, rash, dizziness, SOB, chest pain, palpitations, abdominal pain or changes in bowel or bladder habits.  ?No swelling, tenderness, numbness or tingling in her extremities.  ?No falls or syncope to report.  ?She is eating well and staying hydrated throughout the day. Her weight is 203 lbs.  ? ?ECOG Performance Status: 1 - Symptomatic but completely ambulatory ? ?Medications:  ?Allergies as of 09/24/2021   ? ?   Reactions  ? Latex Rash  ? Penicillin G Rash, Other (See Comments)  ? DID THE REACTION INVOLVE: Swelling of the face/tongue/throat, SOB, or low BP? Unknown ?Sudden or severe rash/hives, skin peeling, or the inside of the mouth or nose? Unknown ?Did it require medical treatment? Unknown ?When did it last happen? Years ago ?If all above answers are "NO", may proceed with cephalosporin use.  ? ?  ? ?  ?Medication List  ?  ? ?  ? Accurate as of September 24, 2021  2:24 PM. If you have any questions, ask your nurse or doctor.  ?  ?  ? ?  ? ?ALPRAZolam 0.25 MG tablet ?Commonly known as: Duanne Moron ?Take 0.25 mg by mouth 2 (two) times daily as needed. ?  ?citalopram 10 MG tablet ?Commonly known as: CELEXA ?Take 10 mg by mouth daily. ?  ?famotidine 40 MG tablet ?Commonly known as: PEPCID ?Take 40 mg by mouth daily. ?  ?FISH OIL PO ?Take 1 capsule by mouth daily. ?  ?lisinopril 5 MG tablet ?Commonly known as: ZESTRIL ?TAKE 1/2 TABLET BY MOUTH DAILY.  Please keep 10/03/21 appt with  Richardson Dopp for future refills. Thank you. ?  ?metoprolol succinate 25 MG 24 hr tablet ?Commonly known as: TOPROL-XL ?TAKE 1 TABLET BY MOUTH EVERY DAY ?  ?multivitamin capsule ?Take 1 capsule by mouth daily. ?  ?PROBIOTIC-10 PO ?Take 1 capsule by mouth daily. ?  ?rosuvastatin 10 MG tablet ?Commonly known as: CRESTOR ?TAKE 1 TABLET BY MOUTH EVERY DAY. PLEASE KEEP 10/03/21 APT FOR FUTURE REFILLS. THANK YOU. ?  ?sodium chloride 0.65 % nasal spray ?Commonly known as: OCEAN ?Place 2 sprays into the nose as needed. ?  ?VITAMIN D PO ?Take 1 tablet by mouth daily. ?  ?Xarelto 10 MG Tabs tablet ?Generic drug: rivaroxaban ?TAKE 1 TABLET BY MOUTH DAILY WITH SUPPER ?  ? ?  ? ? ?Allergies:  ?Allergies  ?Allergen Reactions  ? Latex Rash  ? Penicillin G Rash and Other (See Comments)  ?  DID THE REACTION INVOLVE: Swelling of the face/tongue/throat, SOB, or low BP? Unknown ?Sudden or severe rash/hives, skin peeling, or the inside of the mouth or nose? Unknown ?Did it require medical treatment? Unknown ?When did it last happen? Years ago ?If all above answers are "NO", may proceed with cephalosporin use. ?  ? ? ?Past Medical History, Surgical history, Social history, and Family History were reviewed and updated. ? ?Review of Systems: ?All  other 10 point review of systems is negative.  ? ?Physical Exam: ? weight is 203 lb (92.1 kg). Her oral temperature is 98.3 ?F (36.8 ?C). Her blood pressure is 110/49 (abnormal) and her pulse is 39 (abnormal). Her respiration is 17 and oxygen saturation is 99%.  ? ?Wt Readings from Last 3 Encounters:  ?09/24/21 203 lb (92.1 kg)  ?03/02/21 197 lb (89.4 kg)  ?08/24/20 203 lb 6.4 oz (92.3 kg)  ? ? ?Ocular: Sclerae unicteric, pupils equal, round and reactive to light ?Ear-nose-throat: Oropharynx clear, dentition fair ?Lymphatic: No cervical or supraclavicular adenopathy ?Lungs no rales or rhonchi, good excursion bilaterally ?Heart regular rate and rhythm, no murmur appreciated ?Abd soft, nontender,  positive bowel sounds ?MSK no focal spinal tenderness, no joint edema ?Neuro: non-focal, well-oriented, appropriate affect ?Breasts: Deferred  ? ?Lab Results  ?Component Value Date  ? WBC 5.1 09/24/2021  ? HGB 11.6 (L) 09/24/2021  ? HCT 36.2 09/24/2021  ? MCV 85.8 09/24/2021  ? PLT 162 09/24/2021  ? ?No results found for: FERRITIN, IRON, TIBC, UIBC, IRONPCTSAT ?Lab Results  ?Component Value Date  ? RBC 4.22 09/24/2021  ? ?No results found for: KPAFRELGTCHN, LAMBDASER, KAPLAMBRATIO ?No results found for: IGGSERUM, IGA, IGMSERUM ?No results found for: TOTALPROTELP, ALBUMINELP, A1GS, A2GS, BETS, BETA2SER, GAMS, MSPIKE, SPEI ?  Chemistry   ?   ?Component Value Date/Time  ? NA 138 09/24/2021 1329  ? NA 143 03/10/2020 0903  ? NA 148 (H) 05/15/2017 1452  ? NA 143 07/10/2016 1119  ? K 4.3 09/24/2021 1329  ? K 4.4 05/15/2017 1452  ? K 4.4 07/10/2016 1119  ? CL 106 09/24/2021 1329  ? CL 106 05/15/2017 1452  ? CO2 27 09/24/2021 1329  ? CO2 26 05/15/2017 1452  ? CO2 25 07/10/2016 1119  ? BUN 23 09/24/2021 1329  ? BUN 18 03/10/2020 0903  ? BUN 16 05/15/2017 1452  ? BUN 14.7 07/10/2016 1119  ? CREATININE 0.77 09/24/2021 1329  ? CREATININE 0.7 05/15/2017 1452  ? CREATININE 0.8 07/10/2016 1119  ?    ?Component Value Date/Time  ? CALCIUM 9.6 09/24/2021 1329  ? CALCIUM 10.3 05/15/2017 1452  ? CALCIUM 10.3 07/10/2016 1119  ? ALKPHOS 36 (L) 09/24/2021 1329  ? ALKPHOS 51 05/15/2017 1452  ? ALKPHOS 52 07/10/2016 1119  ? AST 20 09/24/2021 1329  ? AST 19 07/10/2016 1119  ? ALT 15 09/24/2021 1329  ? ALT 28 05/15/2017 1452  ? ALT 14 07/10/2016 1119  ? BILITOT 1.4 (H) 09/24/2021 1329  ? BILITOT 1.03 07/10/2016 1119  ?  ? ? ? ?Impression and Plan: Gloria Lambert is a very pleasant 76 yo Caucasian female with history of DVT in the left leg and she is heterozygous for Factor V Leiden. ?She continues to do well and will stay on her same regimen with Xarelto.  ?Follow-up in 6 months.  ? ?Lottie Dawson, NP ?3/13/20232:24 PM ? ?

## 2021-09-24 NOTE — Telephone Encounter (Signed)
Per 09/24/21 los - gave upcoming appointments - confirmed ?

## 2021-10-03 ENCOUNTER — Other Ambulatory Visit: Payer: Self-pay

## 2021-10-03 ENCOUNTER — Ambulatory Visit: Payer: Medicare Other | Admitting: Physician Assistant

## 2021-10-03 ENCOUNTER — Encounter: Payer: Self-pay | Admitting: Physician Assistant

## 2021-10-03 VITALS — BP 104/64 | HR 63 | Ht 66.0 in | Wt 206.0 lb

## 2021-10-03 DIAGNOSIS — D6851 Activated protein C resistance: Secondary | ICD-10-CM

## 2021-10-03 DIAGNOSIS — I491 Atrial premature depolarization: Secondary | ICD-10-CM | POA: Diagnosis not present

## 2021-10-03 DIAGNOSIS — I2510 Atherosclerotic heart disease of native coronary artery without angina pectoris: Secondary | ICD-10-CM | POA: Diagnosis not present

## 2021-10-03 DIAGNOSIS — E7800 Pure hypercholesterolemia, unspecified: Secondary | ICD-10-CM

## 2021-10-03 DIAGNOSIS — I428 Other cardiomyopathies: Secondary | ICD-10-CM

## 2021-10-03 HISTORY — DX: Atrial premature depolarization: I49.1

## 2021-10-03 MED ORDER — METOPROLOL SUCCINATE ER 25 MG PO TB24: 25.0000 mg | ORAL_TABLET | Freq: Every day | ORAL | 3 refills | Status: DC

## 2021-10-03 MED ORDER — LISINOPRIL 5 MG PO TABS: ORAL_TABLET | ORAL | 3 refills | Status: DC

## 2021-10-03 MED ORDER — ROSUVASTATIN CALCIUM 10 MG PO TABS: ORAL_TABLET | ORAL | 3 refills | Status: DC

## 2021-10-03 NOTE — Assessment & Plan Note (Signed)
She has a lot of PACs.  She is in atrial bigeminy today. Her Dunlap device constantly alerts her that she is in AFib.  I reviewed her tracings on her phone and these are all PACs in a bigeminal pattern.  She is asymptomatic and is already on a beta-blocker.  Her EF was previously 45-50. I will update her echocardiogram to ensure that she has not had any drop in her EF.  If she has, I will review with EP.   ?

## 2021-10-03 NOTE — Assessment & Plan Note (Signed)
AHA Stage B.  NYHA I.  Volume status stable.   ?? Update echocardiogram as noted. ?? Continue metoprolol succinate 25 mg once daily, Lisinopril 2.5 mg once daily  ?? If EF is much lower, will need to consider MRA, SGLT2i as well as repeat ischemic eval ?

## 2021-10-03 NOTE — Patient Instructions (Addendum)
Medication Instructions:  ?Your physician recommends that you continue on your current medications as directed. Please refer to the Current Medication list given to you today. ? ?*If you need a refill on your cardiac medications before your next appointment, please call your pharmacy* ? ? ?Lab Work: ?None ordered ? ?If you have labs (blood work) drawn today and your tests are completely normal, you will receive your results only by: ?MyChart Message (if you have MyChart) OR ?A paper copy in the mail ?If you have any lab test that is abnormal or we need to change your treatment, we will call you to review the results. ? ? ?Testing/Procedures: ?Your physician has requested that you have an echocardiogram. Echocardiography is a painless test that uses sound waves to create images of your heart. It provides your doctor with information about the size and shape of your heart and how well your heart?s chambers and valves are working. This procedure takes approximately one hour. There are no restrictions for this procedure ? ? ?Follow-Up: ?At [facility], you and your health needs are our priority.  As part of our continuing mission to provide you with exceptional heart care, we have created designated Provider Care Teams.  These Care Teams include your primary Cardiologist (physician) and Advanced Practice Providers (APPs -  Physician Assistants and Nurse Practitioners) who all work together to provide you with the care you need, when you need it. ? ?We recommend signing up for the patient portal called "MyChart".  Sign up information is provided on this After Visit Summary.  MyChart is used to connect with patients for Virtual Visits (Telemedicine).  Patients are able to view lab/test results, encounter notes, upcoming appointments, etc.  Non-urgent messages can be sent to your provider as well.   ?To learn more about what you can do with MyChart, go to [email or web address].   ? ?Your next appointment:   ?1  year(s) ? ?The format for your next appointment:   ?In Person ? ?Provider:   ?Sherren Mocha, MD  or Richardson Dopp, PA-C       ? ? ?Other Instructions ? ? ?

## 2021-10-03 NOTE — Assessment & Plan Note (Addendum)
CAC score in 2019 was 12.  No angina.  Continue Rosuvastatin 10 mg once daily.  CAC score was < 100 and she was < 75th percentile.  She does not need ASA.  ?

## 2021-10-03 NOTE — Progress Notes (Signed)
?Cardiology Office Note:   ? ?Date:  10/03/2021  ? ?ID:  Anabia Weatherwax, DOB 12-04-1945, MRN [id number] ? ?PCP:  Nickola Major, MD  ?[facility] Providers ?Cardiologist:  Sherren Mocha, MD ?Cardiology APP:  Liliane Shi, PA-C    ?Referring MD: Nickola Major, MD  ? ?Chief Complaint:  F/u for Cardiomyopathy, Coronary Ca2+ ?  ? ?Patient Profile: ?Coronary artery disease  ?Cor Ca score 12 on CTA 10/19 ?CTA 10/19: LAD plaque; no significant stenosis ?Mild cardiomyopathy ?Echocardiogram 9/19: EF 45-50 ?PACs ?Monitor 9/21: Frequent SV beats, PACs and frequent short SV runs ?Managed w beta-blocker  ?Hyperlipidemia ?Prior DVT ?Factor V Leiden deficiency ?Chronic anticoagulation ?Parathyroid adenoma with hyperparathyroidism ?S/p parathyroidectomy 1.2020  ?Sleep apnea ?GERD ?Depression ? ?Prior CV Studies: ?ZIO monitor 03/2020 ?NSR, average heart rate 66 ?No atrial fibrillation/flutter ?No high-grade heart block or pathologic pauses ?PVCs and frequent supraventricular beats, PACs, frequent short supraventricular runs ? ?Coronary CTA 05/01/18 ?Calcium Score: 12   ?LM: No plaque or stenosis. ?LAD: Calcified plaque in the mid LAD, no significant stenosis. ?Circumflex system: No plaque or stenosis. ?RCA system: No plaque or stenosis. ?IMPRESSION: ?1. Coronary artery calcium score 12 (49th percentile for age and gender) - intermediate risk for future cardiac events. ?2.  No obstructive coronary artery disease noted. ?  ?Echo 04/10/18 ?Mod LVH, EF 45-50, ant-lat HK, Gr 1 DD, trivial MR ?  ? ?History of Present Illness:   ?Mayerli Kirst is a 76 y.o. female with the above problem list.  She was last seen in 8/21.  She returns for follow-up.   She is here alone.  She has been doing well without chest pain, significant shortness of breath, syncope, orthopnea.  She has chronic leg edema from prior L leg DVT and R leg popliteal cyst.  She has not had palpitations.  She has a Paramedic and she has had several alerts  that she is in atrial fibrillation.  I reviewed her saved tracings today and these all demonstrated normal sinus rhythm with PACs.  Her EKG today also demonstrates normal sinus rhythm with PACs/atrial bigeminy.   ? ?   ?Past Medical History:  ?Diagnosis Date  ? Baker's cyst of knee, right   ? 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 Coronary artery calcification seen on CT scan   ? Coronary CTA 10/19:  Ca score 12 (49th percentile - intermediate risk); mid LAD calcified plaque; no obstructive CAD; Small to mod hiatal hernia  ? Depression   ? Dilated cardiomyopathy ([place]) 05/19/2018  ? Echo 04/10/18 - Mod LVH, EF 45-50, ant-lat HK, Gr 1 DD, trivial MR  ? Diverticulosis   ? Edema   ? GERD (gastroesophageal reflux disease)   ? Heart murmur 03/2018  ? Hemorrhoids   ? Heterozygous factor V Leiden mutation ([place]) 11/06/2015  ? History of colon polyps   ? History of echocardiogram   ? Echo 9/19: mod LVH, EF 45-50, ant-lat HK, Gr 1 DD, trivial MR  ? History of hiatal hernia 05/01/2018  ? Noted on CT  ? HLD (hyperlipidemia)   ? Hyperparathyroidism ([place])   ? 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 - surgery not recommended  ? OA (osteoarthritis)   ? Obese   ? Osteopenia   ? Palpitations   ? Cardiac monitor 9/21: NSR, average HR 66; no AFib/flutter; no high-grade heart block; rare PVCs and frequent supraventricular beats (isolated PACs, frequent short SVT runs); SVEs ~24%  ?  Premature atrial contractions 10/03/2021  ? ZIO monitor 03/2020 NSR, average heart rate 66 No atrial fibrillation/flutter No high-grade heart block or pathologic pauses PVCs and frequent supraventricular beats, PACs, frequent short supraventricular runs  ? Sleep apnea   ? does not use CPAP because of fit  ? Stress incontinence   ? ?Current Medications: ?Current Meds  ?Medication Sig  ? ALPRAZolam (XANAX) 0.25 MG tablet Take 0.25 mg by mouth 2 (two) times daily as needed.  ? citalopram (CELEXA) 10 MG tablet Take 10 mg by mouth  daily.  ? famotidine (PEPCID) 40 MG tablet Take 40 mg by mouth daily.  ? Omega-3 Fatty Acids (FISH OIL PO) Take 1 capsule by mouth daily.   ? Probiotic Product (PROBIOTIC-10 PO) Take 1 capsule by mouth daily.  ? sodium chloride (OCEAN) 0.65 % nasal spray Place 2 sprays into the nose as needed.  ? VITAMIN D PO Take 1 tablet by mouth daily.   ? XARELTO 10 MG TABS tablet TAKE 1 TABLET BY MOUTH DAILY WITH SUPPER  ? [DISCONTINUED] lisinopril (ZESTRIL) 5 MG tablet TAKE 1/2 TABLET BY MOUTH DAILY.  Please keep 10/03/21 appt with Richardson Dopp for future refills. Thank you.  ? [DISCONTINUED] metoprolol succinate (TOPROL-XL) 25 MG 24 hr tablet TAKE 1 TABLET BY MOUTH EVERY DAY  ? [DISCONTINUED] rosuvastatin (CRESTOR) 10 MG tablet TAKE 1 TABLET BY MOUTH EVERY DAY. PLEASE KEEP 10/03/21 APT FOR FUTURE REFILLS. THANK YOU.  ?  ?Allergies:   Latex and Penicillin g  ? ?Social History  ? ?Tobacco Use  ? Smoking status: Never  ? Smokeless tobacco: Never  ?Vaping Use  ? Vaping Use: Never used  ?Substance Use Topics  ? Alcohol use: Not Currently  ?  Alcohol/week: 0.0 standard drinks  ?  Comment: socaily  ? Drug use: No  ?  ?Family Hx: ?The patient's family history includes Breast cancer (age of onset: 40) in her mother; Cancer in her father; Heart attack in her paternal grandmother; Heart murmur in her sister. ? ?Review of Systems  ?Gastrointestinal:  Negative for hematochezia.  ?Genitourinary:  Negative for hematuria.   ? ?EKGs/Labs/Other Test Reviewed:   ? ?EKG:  EKG is  ordered today.  The ekg ordered today demonstrates NSR, PACs, atrial bigeminy, QTc 423 ms, no changes ? ?Recent Labs: ?09/24/2021: ALT 15; BUN 23; Creatinine 0.77; Hemoglobin 11.6; Platelet Count 162; Potassium 4.3; Sodium 138  ? ?Recent Lipid Panel ?No results for input(s): CHOL, TRIG, HDL, VLDL, LDLCALC, LDLDIRECT in the last 8760 hours.  ? ?Risk Assessment/Calculations:   ?  ?    ?Physical Exam:   ? ?VS:  BP 104/64 (BP Location: Right Arm, Patient Position: Sitting,  Cuff Size: Normal)   Pulse 63   Ht '5\' 6"'$  (1.676 m)   Wt 206 lb (93.4 kg)   SpO2 98%   BMI 33.25 kg/m?    ? ?Wt Readings from Last 3 Encounters:  ?10/03/21 206 lb (93.4 kg)  ?09/24/21 203 lb (92.1 kg)  ?03/02/21 197 lb (89.4 kg)  ?  ?Constitutional:   ?   Appearance: Healthy appearance. Not in distress.  ?Neck:  ?   Vascular: No carotid bruit or JVR. JVD normal.  ?Pulmonary:  ?   Effort: Pulmonary effort is normal.  ?   Breath sounds: No wheezing. No rales.  ?Cardiovascular:  ?   Normal rate. Regular rhythm. Normal S1. Normal S2.   ?   Murmurs: There is no murmur.  ?Edema: ?   Peripheral edema present. ?  Pretibial: bilateral trace edema of the pretibial area. ?Abdominal:  ?   Palpations: Abdomen is soft.  ?Skin: ?   General: Skin is warm and dry.  ?Neurological:  ?   Mental Status: Alert and oriented to person, place and time.  ?   Cranial Nerves: Cranial nerves are intact.  ?  ?    ?ASSESSMENT & PLAN:   ?Premature atrial contractions ?She has a lot of PACs.  She is in atrial bigeminy today. Her [place] device constantly alerts her that she is in AFib.  I reviewed her tracings on her phone and these are all PACs in a bigeminal pattern.  She is asymptomatic and is already on a beta-blocker.  Her EF was previously 45-50. I will update her echocardiogram to ensure that she has not had any drop in her EF.  If she has, I will review with EP.   ? ?NICM (nonischemic cardiomyopathy) ([place]) ?AHA Stage B.  NYHA I.  Volume status stable.   ?Update echocardiogram as noted. ?Continue metoprolol succinate 25 mg once daily, Lisinopril 2.5 mg once daily  ?If EF is much lower, will need to consider MRA, SGLT2i as well as repeat ischemic eval ? ?Coronary artery calcification seen on CT scan ?CAC score in 2019 was 12.  No angina.  Continue Rosuvastatin 10 mg once daily.  CAC score was < 100 and she was < 75th percentile.  She does not need ASA.  ? ?Heterozygous factor V Leiden mutation ([place]) ?She remains on Rivaroxaban and  is followed by Hematology. ? ?Pure hypercholesterolemia ?LDL optimal on labs in Dec 22.  Continue Rosuvastatin 10 mg once daily.  ? ?     ?   ?Dispo:  Return in about 1 year (around 10/04/2022) for Routine Follow Up

## 2021-10-03 NOTE — Assessment & Plan Note (Signed)
LDL optimal on labs in Dec 22.  Continue Rosuvastatin 10 mg once daily.  ?

## 2021-10-03 NOTE — Assessment & Plan Note (Signed)
She remains on Rivaroxaban and is followed by Hematology. ?

## 2021-10-26 ENCOUNTER — Ambulatory Visit (HOSPITAL_COMMUNITY): Payer: Medicare Other

## 2021-11-12 ENCOUNTER — Ambulatory Visit (HOSPITAL_COMMUNITY): Payer: Medicare Other

## 2021-11-26 ENCOUNTER — Ambulatory Visit (HOSPITAL_COMMUNITY): Payer: Medicare Other

## 2021-12-11 ENCOUNTER — Ambulatory Visit (HOSPITAL_COMMUNITY): Payer: Medicare Other | Attending: Cardiovascular Disease

## 2021-12-11 DIAGNOSIS — I428 Other cardiomyopathies: Secondary | ICD-10-CM | POA: Insufficient documentation

## 2021-12-11 DIAGNOSIS — D6851 Activated protein C resistance: Secondary | ICD-10-CM | POA: Insufficient documentation

## 2021-12-11 DIAGNOSIS — I2510 Atherosclerotic heart disease of native coronary artery without angina pectoris: Secondary | ICD-10-CM | POA: Diagnosis present

## 2021-12-11 DIAGNOSIS — I491 Atrial premature depolarization: Secondary | ICD-10-CM | POA: Diagnosis not present

## 2021-12-11 DIAGNOSIS — E7800 Pure hypercholesterolemia, unspecified: Secondary | ICD-10-CM | POA: Diagnosis present

## 2021-12-11 LAB — ECHOCARDIOGRAM COMPLETE
AR max vel: 2.19 cm2
AV Area VTI: 2.29 cm2
AV Area mean vel: 2.12 cm2
AV Mean grad: 5.5 mmHg
AV Peak grad: 9.3 mmHg
Ao pk vel: 1.53 m/s
Area-P 1/2: 2.81 cm2
S' Lateral: 2.8 cm

## 2022-03-12 ENCOUNTER — Other Ambulatory Visit: Payer: Self-pay | Admitting: Hematology & Oncology

## 2022-03-12 DIAGNOSIS — I82512 Chronic embolism and thrombosis of left femoral vein: Secondary | ICD-10-CM

## 2022-03-27 ENCOUNTER — Encounter: Payer: Self-pay | Admitting: Family

## 2022-03-27 ENCOUNTER — Other Ambulatory Visit: Payer: Self-pay

## 2022-03-27 ENCOUNTER — Inpatient Hospital Stay: Payer: Medicare Other

## 2022-03-27 ENCOUNTER — Inpatient Hospital Stay: Payer: Medicare Other | Attending: Family | Admitting: Family

## 2022-03-27 VITALS — BP 150/82 | HR 50 | Temp 98.1°F | Resp 18 | Ht 66.0 in | Wt 204.4 lb

## 2022-03-27 DIAGNOSIS — D61818 Other pancytopenia: Secondary | ICD-10-CM

## 2022-03-27 DIAGNOSIS — Z86718 Personal history of other venous thrombosis and embolism: Secondary | ICD-10-CM | POA: Diagnosis not present

## 2022-03-27 DIAGNOSIS — Z7901 Long term (current) use of anticoagulants: Secondary | ICD-10-CM | POA: Diagnosis not present

## 2022-03-27 DIAGNOSIS — K921 Melena: Secondary | ICD-10-CM | POA: Diagnosis not present

## 2022-03-27 DIAGNOSIS — D6851 Activated protein C resistance: Secondary | ICD-10-CM | POA: Diagnosis present

## 2022-03-27 DIAGNOSIS — D509 Iron deficiency anemia, unspecified: Secondary | ICD-10-CM | POA: Diagnosis not present

## 2022-03-27 LAB — CBC WITH DIFFERENTIAL (CANCER CENTER ONLY)
Abs Immature Granulocytes: 0.01 10*3/uL (ref 0.00–0.07)
Basophils Absolute: 0 10*3/uL (ref 0.0–0.1)
Basophils Relative: 0 %
Eosinophils Absolute: 0.1 10*3/uL (ref 0.0–0.5)
Eosinophils Relative: 3 %
HCT: 30.3 % — ABNORMAL LOW (ref 36.0–46.0)
Hemoglobin: 9.4 g/dL — ABNORMAL LOW (ref 12.0–15.0)
Immature Granulocytes: 0 %
Lymphocytes Relative: 27 %
Lymphs Abs: 0.8 10*3/uL (ref 0.7–4.0)
MCH: 24.9 pg — ABNORMAL LOW (ref 26.0–34.0)
MCHC: 31 g/dL (ref 30.0–36.0)
MCV: 80.4 fL (ref 80.0–100.0)
Monocytes Absolute: 0.3 10*3/uL (ref 0.1–1.0)
Monocytes Relative: 9 %
Neutro Abs: 1.8 10*3/uL (ref 1.7–7.7)
Neutrophils Relative %: 61 %
Platelet Count: 115 10*3/uL — ABNORMAL LOW (ref 150–400)
RBC: 3.77 MIL/uL — ABNORMAL LOW (ref 3.87–5.11)
RDW: 15.9 % — ABNORMAL HIGH (ref 11.5–15.5)
WBC Count: 2.9 10*3/uL — ABNORMAL LOW (ref 4.0–10.5)
nRBC: 0 % (ref 0.0–0.2)

## 2022-03-27 LAB — SAVE SMEAR(SSMR), FOR PROVIDER SLIDE REVIEW

## 2022-03-27 LAB — CMP (CANCER CENTER ONLY)
ALT: 14 U/L (ref 0–44)
AST: 18 U/L (ref 15–41)
Albumin: 4.2 g/dL (ref 3.5–5.0)
Alkaline Phosphatase: 38 U/L (ref 38–126)
Anion gap: 6 (ref 5–15)
BUN: 19 mg/dL (ref 8–23)
CO2: 26 mmol/L (ref 22–32)
Calcium: 9.4 mg/dL (ref 8.9–10.3)
Chloride: 111 mmol/L (ref 98–111)
Creatinine: 0.73 mg/dL (ref 0.44–1.00)
GFR, Estimated: >60 mL/min (ref >60)
Glucose, Bld: 101 mg/dL — ABNORMAL HIGH (ref 70–99)
Potassium: 4.9 mmol/L (ref 3.5–5.1)
Sodium: 143 mmol/L (ref 135–145)
Total Bilirubin: 1.2 mg/dL (ref 0.3–1.2)
Total Protein: 6.3 g/dL — ABNORMAL LOW (ref 6.5–8.1)

## 2022-03-27 NOTE — Progress Notes (Unsigned)
Hematology and Oncology Follow Up Visit  Gloria Lambert [id number] 1946-04-15 76 y.o. 03/27/2022   Principle Diagnosis:  Thrombus of the left femoral vein Heterozygous for Factor V Leiden mutation   Past Therapy: Xarelto 20 mg by mouth daily - finish 6 months in August 2017   Current Therapy:        Maintenance Xarelto 10 mg by mouth daily   Interim History:  [name] is here today for follow-up. She is doing well at this time and has no complaints.  She denies fatigue.  Hgb is 9.4, MCV 80, platelets 115, WBC count 2.9.  She recently took Azo for what she though may be a bladder infection. Her symptoms have since resolved.  No fever, chills, n/v, cough, rash, dizziness, SOB, chest pain, palpitations, abdominal pain or changes in bowel habits.  No blood loss noted. No abnormal bruising, no petechiae.  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  No swelling noted in hr extremities at this time.  No falls or syncope reported.  Appetite and hydration have been good. Weight is stable at 204 lbs.   ECOG Performance Status: 1 - Symptomatic but completely ambulatory  Medications:  Allergies as of 03/27/2022       Reactions   Latex Rash   Penicillin G Rash, Other (See Comments)   DID THE REACTION INVOLVE: Swelling of the face/tongue/throat, SOB, or low BP? Unknown Sudden or severe rash/hives, skin peeling, or the inside of the mouth or nose? Unknown Did it require medical treatment? Unknown When did it last happen? Years ago If all above answers are "NO", may proceed with cephalosporin use.        Medication List        Accurate as of March 27, 2022  2:05 PM. If you have any questions, ask your nurse or doctor.          STOP taking these medications    sodium chloride 0.65 % nasal spray Commonly known as: OCEAN Stopped by: Lottie Dawson, NP       TAKE these medications    ALPRAZolam 0.25 MG tablet Commonly known as: XANAX Take 0.25 mg by mouth 2 (two)  times daily as needed.   citalopram 10 MG tablet Commonly known as: CELEXA Take 10 mg by mouth daily.   famotidine 40 MG tablet Commonly known as: PEPCID Take 40 mg by mouth daily.   FISH OIL PO Take 1 capsule by mouth daily.   lisinopril 5 MG tablet Commonly known as: ZESTRIL TAKE 1/2 TABLET BY MOUTH DAILY.  Please keep 10/03/21 appt with Richardson Dopp for future refills. Thank you.   metoprolol succinate 25 MG 24 hr tablet Commonly known as: TOPROL-XL Take 1 tablet (25 mg total) by mouth daily.   PROBIOTIC-10 PO Take 1 capsule by mouth daily.   rosuvastatin 10 MG tablet Commonly known as: CRESTOR TAKE 1 TABLET BY MOUTH EVERY DAY.   VITAMIN D PO Take 1 tablet by mouth daily.   Xarelto 10 MG Tabs tablet Generic drug: rivaroxaban TAKE 1 TABLET BY MOUTH DAILY WITH SUPPER        Allergies:  Allergies  Allergen Reactions   Latex Rash   Penicillin G Rash and Other (See Comments)    DID THE REACTION INVOLVE: Swelling of the face/tongue/throat, SOB, or low BP? Unknown Sudden or severe rash/hives, skin peeling, or the inside of the mouth or nose? Unknown Did it require medical treatment? Unknown When did it last happen? Years ago If all  above answers are "NO", may proceed with cephalosporin use.     Past Medical History, Surgical history, Social history, and Family History were reviewed and updated.  Review of Systems: All other 10 point review of systems is negative.   Physical Exam:  height is '5\' 6"'$  (1.676 m) and weight is 204 lb 6.4 oz (92.7 kg). Her oral temperature is 98.1 F (36.7 C). Her blood pressure is 150/82 (abnormal) and her pulse is 50 (abnormal). Her respiration is 18 and oxygen saturation is 100%.   Wt Readings from Last 3 Encounters:  03/27/22 204 lb 6.4 oz (92.7 kg)  10/03/21 206 lb (93.4 kg)  09/24/21 203 lb (92.1 kg)    Ocular: Sclerae unicteric, pupils equal, round and reactive to light Ear-nose-throat: Oropharynx clear, dentition  fair Lymphatic: No cervical or supraclavicular adenopathy Lungs no rales or rhonchi, good excursion bilaterally Heart regular rate and rhythm, no murmur appreciated Abd soft, nontender, positive bowel sounds MSK no focal spinal tenderness, no joint edema Neuro: non-focal, well-oriented, appropriate affect Breasts: Deferred   Lab Results  Component Value Date   WBC 2.9 (L) 03/27/2022   HGB 9.4 (L) 03/27/2022   HCT 30.3 (L) 03/27/2022   MCV 80.4 03/27/2022   PLT 115 (L) 03/27/2022   No results found for: "FERRITIN", "IRON", "TIBC", "UIBC", "IRONPCTSAT" Lab Results  Component Value Date   RBC 3.77 (L) 03/27/2022   No results found for: "KPAFRELGTCHN", "LAMBDASER", "KAPLAMBRATIO" No results found for: "IGGSERUM", "IGA", "IGMSERUM" No results found for: "TOTALPROTELP", "ALBUMINELP", "A1GS", "A2GS", "BETS", "BETA2SER", "GAMS", "MSPIKE", "SPEI"   Chemistry      Component Value Date/Time   NA 138 09/24/2021 1329   NA 143 03/10/2020 0903   NA 148 (H) 05/15/2017 1452   NA 143 07/10/2016 1119   K 4.3 09/24/2021 1329   K 4.4 05/15/2017 1452   K 4.4 07/10/2016 1119   CL 106 09/24/2021 1329   CL 106 05/15/2017 1452   CO2 27 09/24/2021 1329   CO2 26 05/15/2017 1452   CO2 25 07/10/2016 1119   BUN 23 09/24/2021 1329   BUN 18 03/10/2020 0903   BUN 16 05/15/2017 1452   BUN 14.7 07/10/2016 1119   CREATININE 0.77 09/24/2021 1329   CREATININE 0.7 05/15/2017 1452   CREATININE 0.8 07/10/2016 1119      Component Value Date/Time   CALCIUM 9.6 09/24/2021 1329   CALCIUM 10.3 05/15/2017 1452   CALCIUM 10.3 07/10/2016 1119   ALKPHOS 36 (L) 09/24/2021 1329   ALKPHOS 51 05/15/2017 1452   ALKPHOS 52 07/10/2016 1119   AST 20 09/24/2021 1329   AST 19 07/10/2016 1119   ALT 15 09/24/2021 1329   ALT 28 05/15/2017 1452   ALT 14 07/10/2016 1119   BILITOT 1.4 (H) 09/24/2021 1329   BILITOT 1.03 07/10/2016 1119       Impression and Plan: [name] is a very pleasant 76 yo Caucasian female with  history of DVT in the left leg and she is heterozygous for Factor V Leiden. She continues to do well and will stay on her same regimen with Xarelto.  We will have her come back in for iron studies.  She is also going to bring [place] in stool sample cards to assess for occult blood.  Follow-up in 4 months.  Lottie Dawson, NP 9/13/20232:05 PM

## 2022-03-29 DIAGNOSIS — D6851 Activated protein C resistance: Secondary | ICD-10-CM | POA: Diagnosis not present

## 2022-04-01 DIAGNOSIS — D6851 Activated protein C resistance: Secondary | ICD-10-CM | POA: Diagnosis not present

## 2022-04-03 ENCOUNTER — Telehealth: Payer: Self-pay | Admitting: *Deleted

## 2022-04-03 DIAGNOSIS — D6851 Activated protein C resistance: Secondary | ICD-10-CM | POA: Diagnosis not present

## 2022-04-03 NOTE — Telephone Encounter (Signed)
Per 03/27/22 los - called and gave upcoming appointments - confirmed

## 2022-04-05 ENCOUNTER — Inpatient Hospital Stay: Payer: Medicare Other

## 2022-04-10 ENCOUNTER — Other Ambulatory Visit: Payer: Self-pay | Admitting: Physician Assistant

## 2022-04-10 DIAGNOSIS — E7800 Pure hypercholesterolemia, unspecified: Secondary | ICD-10-CM

## 2022-04-12 ENCOUNTER — Inpatient Hospital Stay: Payer: Medicare Other

## 2022-04-12 DIAGNOSIS — D6851 Activated protein C resistance: Secondary | ICD-10-CM | POA: Diagnosis not present

## 2022-04-12 DIAGNOSIS — D509 Iron deficiency anemia, unspecified: Secondary | ICD-10-CM

## 2022-04-12 DIAGNOSIS — K921 Melena: Secondary | ICD-10-CM

## 2022-04-12 LAB — RETICULOCYTES
Immature Retic Fract: 23.2 % — ABNORMAL HIGH (ref 2.3–15.9)
RBC.: 3.92 MIL/uL (ref 3.87–5.11)
Retic Count, Absolute: 54.9 10*3/uL (ref 19.0–186.0)
Retic Ct Pct: 1.4 % (ref 0.4–3.1)

## 2022-04-12 LAB — IRON AND IRON BINDING CAPACITY (CC-WL,HP ONLY)
Iron: 16 ug/dL — ABNORMAL LOW (ref 28–170)
Saturation Ratios: 4 % — ABNORMAL LOW (ref 10.4–31.8)
TIBC: 423 ug/dL (ref 250–450)
UIBC: 407 ug/dL (ref 148–442)

## 2022-04-12 LAB — OCCULT BLOOD X 1 CARD TO LAB, STOOL
Fecal Occult Bld: NEGATIVE
Fecal Occult Bld: NEGATIVE
Fecal Occult Bld: NEGATIVE

## 2022-04-12 LAB — FERRITIN: Ferritin: 8 ng/mL — ABNORMAL LOW (ref 11–307)

## 2022-04-15 ENCOUNTER — Other Ambulatory Visit: Payer: Self-pay | Admitting: Family

## 2022-04-15 DIAGNOSIS — D509 Iron deficiency anemia, unspecified: Secondary | ICD-10-CM | POA: Insufficient documentation

## 2022-04-15 DIAGNOSIS — D500 Iron deficiency anemia secondary to blood loss (chronic): Secondary | ICD-10-CM

## 2022-04-29 ENCOUNTER — Inpatient Hospital Stay: Payer: Medicare Other | Attending: Family

## 2022-04-29 VITALS — BP 115/51 | HR 44 | Temp 98.4°F | Resp 17

## 2022-04-29 DIAGNOSIS — D6851 Activated protein C resistance: Secondary | ICD-10-CM | POA: Diagnosis present

## 2022-04-29 DIAGNOSIS — D500 Iron deficiency anemia secondary to blood loss (chronic): Secondary | ICD-10-CM | POA: Diagnosis present

## 2022-04-29 MED ORDER — SODIUM CHLORIDE 0.9 % IV SOLN: Freq: Once | INTRAVENOUS | Status: AC

## 2022-04-29 MED ORDER — SODIUM CHLORIDE 0.9 % IV SOLN
510.0000 mg | Freq: Once | INTRAVENOUS | Status: AC
  Administered 2022-04-29: 510 mg via INTRAVENOUS
  Filled 2022-04-29: qty 17

## 2022-04-29 NOTE — Patient Instructions (Signed)

## 2022-05-06 ENCOUNTER — Inpatient Hospital Stay: Payer: Medicare Other

## 2022-05-06 VITALS — BP 108/46 | HR 68 | Resp 18

## 2022-05-06 DIAGNOSIS — D500 Iron deficiency anemia secondary to blood loss (chronic): Secondary | ICD-10-CM

## 2022-05-06 MED ORDER — SODIUM CHLORIDE 0.9 % IV SOLN: Freq: Once | INTRAVENOUS | Status: AC

## 2022-05-06 MED ORDER — SODIUM CHLORIDE 0.9 % IV SOLN
510.0000 mg | Freq: Once | INTRAVENOUS | Status: AC
  Administered 2022-05-06: 510 mg via INTRAVENOUS
  Filled 2022-05-06: qty 17

## 2022-05-06 NOTE — Patient Instructions (Signed)

## 2022-05-31 ENCOUNTER — Other Ambulatory Visit: Payer: Self-pay | Admitting: Internal Medicine

## 2022-05-31 ENCOUNTER — Ambulatory Visit
Admission: RE | Admit: 2022-05-31 | Discharge: 2022-05-31 | Disposition: A | Discharge status: Home / Self Care | Payer: Medicare Other | Source: Ambulatory Visit | Attending: Internal Medicine | Admitting: Internal Medicine

## 2022-05-31 DIAGNOSIS — M810 Age-related osteoporosis without current pathological fracture: Secondary | ICD-10-CM

## 2022-06-24 ENCOUNTER — Inpatient Hospital Stay: Payer: Medicare Other

## 2022-07-12 ENCOUNTER — Inpatient Hospital Stay: Payer: Medicare Other | Attending: Family

## 2022-07-12 ENCOUNTER — Other Ambulatory Visit: Payer: Self-pay | Admitting: *Deleted

## 2022-07-12 DIAGNOSIS — D6851 Activated protein C resistance: Secondary | ICD-10-CM | POA: Diagnosis present

## 2022-07-12 DIAGNOSIS — D61818 Other pancytopenia: Secondary | ICD-10-CM

## 2022-07-12 DIAGNOSIS — D500 Iron deficiency anemia secondary to blood loss (chronic): Secondary | ICD-10-CM | POA: Insufficient documentation

## 2022-07-12 DIAGNOSIS — Z7901 Long term (current) use of anticoagulants: Secondary | ICD-10-CM | POA: Diagnosis not present

## 2022-07-12 DIAGNOSIS — K921 Melena: Secondary | ICD-10-CM

## 2022-07-12 DIAGNOSIS — D509 Iron deficiency anemia, unspecified: Secondary | ICD-10-CM

## 2022-07-12 LAB — CBC WITH DIFFERENTIAL (CANCER CENTER ONLY)
Abs Immature Granulocytes: 0.01 10*3/uL (ref 0.00–0.07)
Basophils Absolute: 0 10*3/uL (ref 0.0–0.1)
Basophils Relative: 1 %
Eosinophils Absolute: 0.2 10*3/uL (ref 0.0–0.5)
Eosinophils Relative: 4 %
HCT: 41.4 % (ref 36.0–46.0)
Hemoglobin: 13.4 g/dL (ref 12.0–15.0)
Immature Granulocytes: 0 %
Lymphocytes Relative: 23 %
Lymphs Abs: 1 10*3/uL (ref 0.7–4.0)
MCH: 28.6 pg (ref 26.0–34.0)
MCHC: 32.4 g/dL (ref 30.0–36.0)
MCV: 88.3 fL (ref 80.0–100.0)
Monocytes Absolute: 0.3 10*3/uL (ref 0.1–1.0)
Monocytes Relative: 6 %
Neutro Abs: 2.9 10*3/uL (ref 1.7–7.7)
Neutrophils Relative %: 66 %
Platelet Count: 137 10*3/uL — ABNORMAL LOW (ref 150–400)
RBC: 4.69 MIL/uL (ref 3.87–5.11)
RDW: 18.9 % — ABNORMAL HIGH (ref 11.5–15.5)
WBC Count: 4.3 10*3/uL (ref 4.0–10.5)
nRBC: 0 % (ref 0.0–0.2)

## 2022-07-12 LAB — CMP (CANCER CENTER ONLY)
ALT: 15 U/L (ref 0–44)
AST: 18 U/L (ref 15–41)
Albumin: 4.4 g/dL (ref 3.5–5.0)
Alkaline Phosphatase: 40 U/L (ref 38–126)
Anion gap: 7 (ref 5–15)
BUN: 18 mg/dL (ref 8–23)
CO2: 26 mmol/L (ref 22–32)
Calcium: 9.4 mg/dL (ref 8.9–10.3)
Chloride: 110 mmol/L (ref 98–111)
Creatinine: 0.82 mg/dL (ref 0.44–1.00)
GFR, Estimated: >60 mL/min (ref >60)
Glucose, Bld: 139 mg/dL — ABNORMAL HIGH (ref 70–99)
Potassium: 5 mmol/L (ref 3.5–5.1)
Sodium: 143 mmol/L (ref 135–145)
Total Bilirubin: 0.8 mg/dL (ref 0.3–1.2)
Total Protein: 6.9 g/dL (ref 6.5–8.1)

## 2022-07-12 LAB — FERRITIN: Ferritin: 37 ng/mL (ref 11–307)

## 2022-07-12 LAB — IRON AND IRON BINDING CAPACITY (CC-WL,HP ONLY)
Iron: 71 ug/dL (ref 28–170)
Saturation Ratios: 22 % (ref 10.4–31.8)
TIBC: 330 ug/dL (ref 250–450)
UIBC: 259 ug/dL (ref 148–442)

## 2022-07-12 LAB — RETICULOCYTES
Immature Retic Fract: 10 % (ref 2.3–15.9)
RBC.: 4.64 MIL/uL (ref 3.87–5.11)
Retic Count, Absolute: 78 10*3/uL (ref 19.0–186.0)
Retic Ct Pct: 1.7 % (ref 0.4–3.1)

## 2022-07-16 ENCOUNTER — Encounter: Payer: Self-pay | Admitting: *Deleted

## 2022-07-26 ENCOUNTER — Other Ambulatory Visit: Payer: Self-pay | Admitting: *Deleted

## 2022-07-26 DIAGNOSIS — D500 Iron deficiency anemia secondary to blood loss (chronic): Secondary | ICD-10-CM

## 2022-07-29 ENCOUNTER — Inpatient Hospital Stay: Payer: Medicare Other | Admitting: Family

## 2022-07-29 ENCOUNTER — Inpatient Hospital Stay: Payer: Medicare Other

## 2022-07-30 ENCOUNTER — Inpatient Hospital Stay: Payer: Medicare Other | Attending: Family

## 2022-07-30 ENCOUNTER — Encounter: Payer: Self-pay | Admitting: Family

## 2022-07-30 ENCOUNTER — Inpatient Hospital Stay: Payer: Medicare Other | Admitting: Family

## 2022-07-30 VITALS — BP 109/48 | HR 76 | Temp 98.4°F | Resp 18 | Wt 206.8 lb

## 2022-07-30 DIAGNOSIS — D6851 Activated protein C resistance: Secondary | ICD-10-CM | POA: Insufficient documentation

## 2022-07-30 DIAGNOSIS — Z86718 Personal history of other venous thrombosis and embolism: Secondary | ICD-10-CM | POA: Diagnosis not present

## 2022-07-30 DIAGNOSIS — D500 Iron deficiency anemia secondary to blood loss (chronic): Secondary | ICD-10-CM

## 2022-07-30 DIAGNOSIS — Z7901 Long term (current) use of anticoagulants: Secondary | ICD-10-CM | POA: Insufficient documentation

## 2022-07-30 LAB — CMP (CANCER CENTER ONLY)
ALT: 16 U/L (ref 0–44)
AST: 20 U/L (ref 15–41)
Albumin: 3.8 g/dL (ref 3.5–5.0)
Alkaline Phosphatase: 36 U/L — ABNORMAL LOW (ref 38–126)
Anion gap: 7 (ref 5–15)
BUN: 15 mg/dL (ref 8–23)
CO2: 24 mmol/L (ref 22–32)
Calcium: 8.8 mg/dL — ABNORMAL LOW (ref 8.9–10.3)
Chloride: 108 mmol/L (ref 98–111)
Creatinine: 0.74 mg/dL (ref 0.44–1.00)
GFR, Estimated: >60 mL/min (ref >60)
Glucose, Bld: 103 mg/dL — ABNORMAL HIGH (ref 70–99)
Potassium: 4.7 mmol/L (ref 3.5–5.1)
Sodium: 139 mmol/L (ref 135–145)
Total Bilirubin: 1.3 mg/dL — ABNORMAL HIGH (ref 0.3–1.2)
Total Protein: 6.7 g/dL (ref 6.5–8.1)

## 2022-07-30 LAB — CBC WITH DIFFERENTIAL (CANCER CENTER ONLY)
Abs Immature Granulocytes: 0.02 10*3/uL (ref 0.00–0.07)
Basophils Absolute: 0 10*3/uL (ref 0.0–0.1)
Basophils Relative: 1 %
Eosinophils Absolute: 0.2 10*3/uL (ref 0.0–0.5)
Eosinophils Relative: 4 %
HCT: 38.8 % (ref 36.0–46.0)
Hemoglobin: 12.9 g/dL (ref 12.0–15.0)
Immature Granulocytes: 0 %
Lymphocytes Relative: 22 %
Lymphs Abs: 1 10*3/uL (ref 0.7–4.0)
MCH: 29.2 pg (ref 26.0–34.0)
MCHC: 33.2 g/dL (ref 30.0–36.0)
MCV: 87.8 fL (ref 80.0–100.0)
Monocytes Absolute: 0.3 10*3/uL (ref 0.1–1.0)
Monocytes Relative: 7 %
Neutro Abs: 3 10*3/uL (ref 1.7–7.7)
Neutrophils Relative %: 66 %
Platelet Count: 98 10*3/uL — ABNORMAL LOW (ref 150–400)
RBC: 4.42 MIL/uL (ref 3.87–5.11)
RDW: 16.5 % — ABNORMAL HIGH (ref 11.5–15.5)
WBC Count: 4.6 10*3/uL (ref 4.0–10.5)
nRBC: 0 % (ref 0.0–0.2)

## 2022-07-30 NOTE — Progress Notes (Signed)
Hematology and Oncology Follow Up Visit  Rikita Grabert [id number] 1946-06-03 77 y.o. 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 Kendra is here today for follow-up. She is doing well and has no complaints at his time outside the osteoarthritis in her left hip that bothers her off and on.  She is doing well on maintenance Xarelto. No issues with blood loss. No bruising or petechiae.  No swelling in her extremities. She has tingling in her toes that comes and goes.  No falls or syncope reported.  No fever, chills, n/v, cough, rash, dizziness, SOB, chest pain, palpitations, abdominal pain or changes in bowel or bladder habits.  Appetite is good and she is doing her best to stay well hydrated. Her weight is stable at 206 lbs.   ECOG Performance Status: 1 - Symptomatic but completely ambulatory  Medications:  Allergies as of 07/30/2022       Reactions   Latex Rash   Penicillin G Rash, Other (See Comments)   DID THE REACTION INVOLVE: Swelling of the face/tongue/throat, SOB, or low BP? Unknown Sudden or severe rash/hives, skin peeling, or the inside of the mouth or nose? Unknown Did it require medical treatment? Unknown When did it last happen? Years ago If all above answers are "NO", may proceed with cephalosporin use.        Medication List        Accurate as of July 30, 2022  1:08 PM. If you have any questions, ask your nurse or doctor.          ALPRAZolam 0.25 MG tablet Commonly known as: XANAX Take 0.25 mg by mouth 2 (two) times daily as needed.   citalopram 10 MG tablet Commonly known as: CELEXA Take 10 mg by mouth daily.   famotidine 40 MG tablet Commonly known as: PEPCID Take 40 mg by mouth daily.   FISH OIL  PO Take 1 capsule by mouth daily.   lisinopril 5 MG tablet Commonly known as: ZESTRIL TAKE 1/2 TABLET BY MOUTH DAILY.  Please keep 10/03/21 appt with Richardson Dopp for future refills. Thank you.   metoprolol succinate 25 MG 24 hr tablet Commonly known as: TOPROL-XL Take 1 tablet (25 mg total) by mouth daily.   PROBIOTIC-10 PO Take 1 capsule by mouth daily.   rosuvastatin 10 MG tablet Commonly known as: CRESTOR TAKE 1 TABLET BY MOUTH EVERY DAY   VITAMIN D PO Take 1 tablet by mouth daily.   Xarelto 10 MG Tabs tablet Generic drug: rivaroxaban TAKE 1 TABLET BY MOUTH DAILY WITH SUPPER        Allergies:  Allergies  Allergen Reactions   Latex Rash   Penicillin G Rash and Other (See Comments)    DID THE REACTION INVOLVE: Swelling of the face/tongue/throat, SOB, or low BP? Unknown Sudden or severe rash/hives, skin peeling, or the inside of the mouth or nose? Unknown Did it require medical treatment? Unknown When did it last happen? Years ago If all above answers are "NO", may proceed with cephalosporin use.     Past Medical History, Surgical history, Social history, and Family History were reviewed and updated.  Review of Systems: All other 10 point review of systems is  negative.   Physical Exam:  vitals were not taken for this visit.   Wt Readings from Last 3 Encounters:  03/27/22 204 lb 6.4 oz (92.7 kg)  10/03/21 206 lb (93.4 kg)  09/24/21 203 lb (92.1 kg)    Ocular: Sclerae unicteric, pupils equal, round and reactive to light Ear-nose-throat: Oropharynx clear, dentition fair Lymphatic: No cervical or supraclavicular adenopathy Lungs no rales or rhonchi, good excursion bilaterally Heart regular rate and rhythm, no murmur appreciated Abd soft, nontender, positive bowel sounds MSK no focal spinal tenderness, no joint edema Neuro: non-focal, well-oriented, appropriate affect Breasts: Deferred   Lab Results  Component Value Date   WBC 4.3 07/12/2022   HGB 13.4  07/12/2022   HCT 41.4 07/12/2022   MCV 88.3 07/12/2022   PLT 137 (L) 07/12/2022   Lab Results  Component Value Date   FERRITIN 37 07/12/2022   IRON 71 07/12/2022   TIBC 330 07/12/2022   UIBC 259 07/12/2022   IRONPCTSAT 22 07/12/2022   Lab Results  Component Value Date   RETICCTPCT 1.7 07/12/2022   RBC 4.64 07/12/2022   No results found for: "KPAFRELGTCHN", "LAMBDASER", "KAPLAMBRATIO" No results found for: "IGGSERUM", "IGA", "IGMSERUM" No results found for: "TOTALPROTELP", "ALBUMINELP", "A1GS", "A2GS", "BETS", "BETA2SER", "GAMS", "MSPIKE", "SPEI"   Chemistry      Component Value Date/Time   NA 143 07/12/2022 1101   NA 143 03/10/2020 0903   NA 148 (H) 05/15/2017 1452   NA 143 07/10/2016 1119   K 5.0 07/12/2022 1101   K 4.4 05/15/2017 1452   K 4.4 07/10/2016 1119   CL 110 07/12/2022 1101   CL 106 05/15/2017 1452   CO2 26 07/12/2022 1101   CO2 26 05/15/2017 1452   CO2 25 07/10/2016 1119   BUN 18 07/12/2022 1101   BUN 18 03/10/2020 0903   BUN 16 05/15/2017 1452   BUN 14.7 07/10/2016 1119   CREATININE 0.82 07/12/2022 1101   CREATININE 0.7 05/15/2017 1452   CREATININE 0.8 07/10/2016 1119      Component Value Date/Time   CALCIUM 9.4 07/12/2022 1101   CALCIUM 10.3 05/15/2017 1452   CALCIUM 10.3 07/10/2016 1119   ALKPHOS 40 07/12/2022 1101   ALKPHOS 51 05/15/2017 1452   ALKPHOS 52 07/10/2016 1119   AST 18 07/12/2022 1101   AST 19 07/10/2016 1119   ALT 15 07/12/2022 1101   ALT 28 05/15/2017 1452   ALT 14 07/10/2016 1119   BILITOT 0.8 07/12/2022 1101   BILITOT 1.03 07/10/2016 1119       Impression and Plan: Ms. Muhlenkamp is a very pleasant 77 yo Caucasian female with history of DVT in the left leg and she is heterozygous for Factor V Leiden. She continues to do well and will stay on her same regimen with Xarelto.  Follow-up in 4 months.  Lottie Dawson, NP 1/16/20241:08 PM

## 2022-09-09 ENCOUNTER — Other Ambulatory Visit: Payer: Self-pay | Admitting: Physician Assistant

## 2022-10-30 ENCOUNTER — Encounter: Payer: Self-pay | Admitting: Family

## 2022-10-30 ENCOUNTER — Other Ambulatory Visit: Payer: Self-pay | Admitting: Cardiovascular Disease

## 2022-11-20 ENCOUNTER — Other Ambulatory Visit: Payer: Self-pay | Admitting: Physician Assistant

## 2022-11-22 ENCOUNTER — Encounter: Payer: Self-pay | Admitting: Family

## 2022-11-29 ENCOUNTER — Encounter: Payer: Self-pay | Admitting: Family

## 2022-11-29 ENCOUNTER — Inpatient Hospital Stay: Payer: Medicare HMO | Admitting: Family

## 2022-11-29 ENCOUNTER — Inpatient Hospital Stay: Payer: Medicare HMO | Attending: Hematology & Oncology

## 2022-11-29 VITALS — BP 122/50 | HR 71 | Temp 98.2°F | Resp 18 | Wt 191.1 lb

## 2022-11-29 DIAGNOSIS — D500 Iron deficiency anemia secondary to blood loss (chronic): Secondary | ICD-10-CM

## 2022-11-29 DIAGNOSIS — Z7901 Long term (current) use of anticoagulants: Secondary | ICD-10-CM | POA: Diagnosis not present

## 2022-11-29 DIAGNOSIS — D6851 Activated protein C resistance: Secondary | ICD-10-CM | POA: Insufficient documentation

## 2022-11-29 DIAGNOSIS — G629 Polyneuropathy, unspecified: Secondary | ICD-10-CM | POA: Insufficient documentation

## 2022-11-29 DIAGNOSIS — I82512 Chronic embolism and thrombosis of left femoral vein: Secondary | ICD-10-CM | POA: Insufficient documentation

## 2022-11-29 LAB — CMP (CANCER CENTER ONLY)
ALT: 10 U/L (ref 0–44)
AST: 17 U/L (ref 15–41)
Albumin: 4.5 g/dL (ref 3.5–5.0)
Alkaline Phosphatase: 36 U/L — ABNORMAL LOW (ref 38–126)
Anion gap: 7 (ref 5–15)
BUN: 15 mg/dL (ref 8–23)
CO2: 26 mmol/L (ref 22–32)
Calcium: 9.9 mg/dL (ref 8.9–10.3)
Chloride: 110 mmol/L (ref 98–111)
Creatinine: 0.71 mg/dL (ref 0.44–1.00)
GFR, Estimated: >60 mL/min (ref >60)
Glucose, Bld: 107 mg/dL — ABNORMAL HIGH (ref 70–99)
Potassium: 4.8 mmol/L (ref 3.5–5.1)
Sodium: 143 mmol/L (ref 135–145)
Total Bilirubin: 1.5 mg/dL — ABNORMAL HIGH (ref 0.3–1.2)
Total Protein: 6.9 g/dL (ref 6.5–8.1)

## 2022-11-29 LAB — CBC WITH DIFFERENTIAL (CANCER CENTER ONLY)
Abs Immature Granulocytes: 0.05 10*3/uL (ref 0.00–0.07)
Basophils Absolute: 0 10*3/uL (ref 0.0–0.1)
Basophils Relative: 1 %
Eosinophils Absolute: 0.1 10*3/uL (ref 0.0–0.5)
Eosinophils Relative: 3 %
HCT: 37.8 % (ref 36.0–46.0)
Hemoglobin: 12.5 g/dL (ref 12.0–15.0)
Immature Granulocytes: 1 %
Lymphocytes Relative: 23 %
Lymphs Abs: 0.9 10*3/uL (ref 0.7–4.0)
MCH: 29.1 pg (ref 26.0–34.0)
MCHC: 33.1 g/dL (ref 30.0–36.0)
MCV: 87.9 fL (ref 80.0–100.0)
Monocytes Absolute: 0.3 10*3/uL (ref 0.1–1.0)
Monocytes Relative: 6 %
Neutro Abs: 2.7 10*3/uL (ref 1.7–7.7)
Neutrophils Relative %: 66 %
Platelet Count: 117 10*3/uL — ABNORMAL LOW (ref 150–400)
RBC: 4.3 MIL/uL (ref 3.87–5.11)
RDW: 13.5 % (ref 11.5–15.5)
WBC Count: 4 10*3/uL (ref 4.0–10.5)
nRBC: 0 % (ref 0.0–0.2)

## 2022-11-29 LAB — RETICULOCYTES
Immature Retic Fract: 11.5 % (ref 2.3–15.9)
RBC.: 4.29 MIL/uL (ref 3.87–5.11)
Retic Count, Absolute: 67.8 10*3/uL (ref 19.0–186.0)
Retic Ct Pct: 1.6 % (ref 0.4–3.1)

## 2022-11-29 LAB — FERRITIN: Ferritin: 16 ng/mL (ref 11–307)

## 2022-11-29 LAB — IRON AND IRON BINDING CAPACITY (CC-WL,HP ONLY)
Iron: 49 ug/dL (ref 28–170)
Saturation Ratios: 13 % (ref 10.4–31.8)
TIBC: 367 ug/dL (ref 250–450)
UIBC: 318 ug/dL (ref 148–442)

## 2022-11-29 NOTE — Progress Notes (Signed)
Hematology and Oncology Follow Up Visit  Gloria Lambert [id number] 1946/06/30 77 y.o. 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 She is doing well. She recently fell while helping her sister clean a rug in their driveway. She did hit her face on the concrete and scrapped her left knee. She had some bruising around both eyes as well as bump above the left eye. Thankfully she states that she went to the ED and she was not seriously injured.  No other falls, no syncope.  No fever, chills, n/v, cough, rash, dizziness, SOB, chest pain, palpitations, abdominal pain or changes in bowel or bladder habits.  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  No swelling or tenderness in her extremities.  Appetite and hydration are good. She is doing weight watchers and has lost 15 lbs since we last saw her. Weight is stable at 191 lbs.   ECOG Performance Status: 0 - Asymptomatic  Medications:  Allergies as of 11/29/2022       Reactions   Latex Rash   Penicillin G Rash, Other (See Comments)   DID THE REACTION INVOLVE: Swelling of the face/tongue/throat, SOB, or low BP? Unknown Sudden or severe rash/hives, skin peeling, or the inside of the mouth or nose? Unknown Did it require medical treatment? Unknown When did it last happen? Years ago If all above answers are "NO", may proceed with cephalosporin use.        Medication List        Accurate as of Nov 29, 2022  1:05 PM. If you have any questions, ask your nurse or doctor.          ALPRAZolam 0.25 MG tablet Commonly known as: XANAX Take 0.25 mg by mouth 2 (two) times daily as needed.   citalopram 10 MG tablet Commonly known as: CELEXA Take 10  mg by mouth daily.   famotidine 40 MG tablet Commonly known as: PEPCID Take 40 mg by mouth daily.   FISH OIL PO Take 1 capsule by mouth daily.   lisinopril 5 MG tablet Commonly known as: ZESTRIL TAKE 1/2 TABLET BY MOUTH DAILY   metoprolol succinate 25 MG 24 hr tablet Commonly known as: TOPROL-XL TAKE 1 TABLET (25 MG TOTAL) BY MOUTH DAILY.   PROBIOTIC-10 PO Take 1 capsule by mouth daily.   rosuvastatin 10 MG tablet Commonly known as: CRESTOR TAKE 1 TABLET BY MOUTH EVERY DAY   VITAMIN D PO Take 1 tablet by mouth daily.   Xarelto 10 MG Tabs tablet Generic drug: rivaroxaban TAKE 1 TABLET BY MOUTH DAILY WITH SUPPER        Allergies:  Allergies  Allergen Reactions   Latex Rash   Penicillin G Rash and Other (See Comments)    DID THE REACTION INVOLVE: Swelling of the face/tongue/throat, SOB, or low BP? Unknown Sudden or severe rash/hives, skin peeling, or the inside of the mouth or nose? Unknown Did it require medical treatment? Unknown When did it last happen? Years ago If all above answers are "NO", may proceed with cephalosporin use.     Past Medical History, Surgical history, Social history, and Family History were reviewed and updated.  Review  of Systems: All other 10 point review of systems is negative.   Physical Exam:  vitals were not taken for this visit.   Wt Readings from Last 3 Encounters:  07/30/22 206 lb 12.8 oz (93.8 kg)  03/27/22 204 lb 6.4 oz (92.7 kg)  10/03/21 206 lb (93.4 kg)    Ocular: Sclerae unicteric, pupils equal, round and reactive to light Ear-nose-throat: Oropharynx clear, dentition fair Lymphatic: No cervical or supraclavicular adenopathy Lungs no rales or rhonchi, good excursion bilaterally Heart regular rate and rhythm, no murmur appreciated Abd soft, nontender, positive bowel sounds MSK no focal spinal tenderness, no joint edema Neuro: non-focal, well-oriented, appropriate affect Breasts: Deferred   Lab Results   Component Value Date   WBC 4.6 07/30/2022   HGB 12.9 07/30/2022   HCT 38.8 07/30/2022   MCV 87.8 07/30/2022   PLT 98 (L) 07/30/2022   Lab Results  Component Value Date   FERRITIN 37 07/12/2022   IRON 71 07/12/2022   TIBC 330 07/12/2022   UIBC 259 07/12/2022   IRONPCTSAT 22 07/12/2022   Lab Results  Component Value Date   RETICCTPCT 1.7 07/12/2022   RBC 4.42 07/30/2022   No results found for: "KPAFRELGTCHN", "LAMBDASER", "KAPLAMBRATIO" No results found for: "IGGSERUM", "IGA", "IGMSERUM" No results found for: "TOTALPROTELP", "ALBUMINELP", "A1GS", "A2GS", "BETS", "BETA2SER", "GAMS", "MSPIKE", "SPEI"   Chemistry      Component Value Date/Time   NA 139 07/30/2022 1254   NA 143 03/10/2020 0903   NA 148 (H) 05/15/2017 1452   NA 143 07/10/2016 1119   K 4.7 07/30/2022 1254   K 4.4 05/15/2017 1452   K 4.4 07/10/2016 1119   CL 108 07/30/2022 1254   CL 106 05/15/2017 1452   CO2 24 07/30/2022 1254   CO2 26 05/15/2017 1452   CO2 25 07/10/2016 1119   BUN 15 07/30/2022 1254   BUN 18 03/10/2020 0903   BUN 16 05/15/2017 1452   BUN 14.7 07/10/2016 1119   CREATININE 0.74 07/30/2022 1254   CREATININE 0.7 05/15/2017 1452   CREATININE 0.8 07/10/2016 1119      Component Value Date/Time   CALCIUM 8.8 (L) 07/30/2022 1254   CALCIUM 10.3 05/15/2017 1452   CALCIUM 10.3 07/10/2016 1119   ALKPHOS 36 (L) 07/30/2022 1254   ALKPHOS 51 05/15/2017 1452   ALKPHOS 52 07/10/2016 1119   AST 20 07/30/2022 1254   AST 19 07/10/2016 1119   ALT 16 07/30/2022 1254   ALT 28 05/15/2017 1452   ALT 14 07/10/2016 1119   BILITOT 1.3 (H) 07/30/2022 1254   BILITOT 1.03 07/10/2016 1119       Impression and Plan: Gloria Lambert is a very pleasant 77 yo Caucasian female with history of DVT in the left leg and she is heterozygous for Factor V Leiden. She continues to do well and will stay on her same regimen with Xarelto.  Follow-up in 6 months.  Eileen Stanford, NP 5/17/20241:05 PM

## 2022-12-12 ENCOUNTER — Other Ambulatory Visit: Payer: Self-pay | Admitting: Physician Assistant

## 2022-12-20 ENCOUNTER — Other Ambulatory Visit: Payer: Self-pay | Admitting: Physician Assistant

## 2023-02-25 ENCOUNTER — Other Ambulatory Visit: Payer: Self-pay | Admitting: Cardiovascular Disease

## 2023-03-07 ENCOUNTER — Other Ambulatory Visit: Payer: Self-pay | Admitting: Hematology & Oncology

## 2023-03-07 DIAGNOSIS — I82512 Chronic embolism and thrombosis of left femoral vein: Secondary | ICD-10-CM

## 2023-03-08 ENCOUNTER — Encounter: Payer: Self-pay | Admitting: Family

## 2023-03-10 ENCOUNTER — Other Ambulatory Visit: Payer: Self-pay | Admitting: Cardiovascular Disease

## 2023-03-13 ENCOUNTER — Other Ambulatory Visit: Payer: Self-pay | Admitting: Cardiovascular Disease

## 2023-03-30 ENCOUNTER — Other Ambulatory Visit: Payer: Self-pay | Admitting: Physician Assistant

## 2023-03-30 DIAGNOSIS — E7800 Pure hypercholesterolemia, unspecified: Secondary | ICD-10-CM

## 2023-05-12 ENCOUNTER — Telehealth: Payer: Self-pay | Admitting: Physician Assistant

## 2023-05-12 MED ORDER — METOPROLOL SUCCINATE ER 25 MG PO TB24: 25.0000 mg | ORAL_TABLET | Freq: Every day | ORAL | 0 refills | Status: DC

## 2023-05-12 NOTE — Telephone Encounter (Signed)
Pt's medication was sent to pt's pharmacy as requested. Confirmation received.  °

## 2023-05-12 NOTE — Telephone Encounter (Signed)
*  STAT* If patient is at the pharmacy, call can be transferred to refill team.   1. Which medications need to be refilled? (please list name of each medication and dose if known)  metoprolol succinate (TOPROL-XL) 25 MG 24 hr tablet  2. Which pharmacy/location (including street and city if local pharmacy) is medication to be sent to? [id number] - [place], [place] - [address]  3. Do they need a 30 day or 90 day supply?  30 day supply

## 2023-05-22 NOTE — Progress Notes (Signed)
Cardiology Office Note:    Date:  05/23/2023  ID:  Gloria Lambert, DOB 1946/03/30, MRN [id number] PCP: Gwenlyn Found, MD  [facility] Providers Cardiologist:  Tonny Bollman, MD Cardiology APP:  Beatrice Lecher, PA-C       Patient Profile:      Coronary artery disease  Cor Ca score 12 on CTA 10/19 CTA 10/19: LAD plaque; no significant stenosis, CAC score 12 (49th percentile) Mild cardiomyopathy Echocardiogram 04/10/18: Mod LVH, EF 45-50, ant-lat HK, Gr 1 DD, trivial MR  Echo 11/2021: EF 50-50, no RWMA, GRII DD, normal RVSF, normal PASP, severe BAE, trivial MR PACs Monitor 9/21: Frequent SV beats, PACs and frequent short SV runs Managed w beta-blocker  Hyperlipidemia Prior DVT Factor V Leiden deficiency Chronic anticoagulation Parathyroid adenoma with hyperparathyroidism S/p parathyroidectomy 1.2020  Sleep apnea GERD Depression          History of Present Illness:  Discussed the use of AI scribe software for clinical note transcription with the patient, who gave verbal consent to proceed.  Gloria Lambert is a 77 y.o. female who returns for follow up of CAD, cardiomyopathy. She was last seen 09/2021.  She is here alone. She reports no new health concerns. She reports no chest pain or shortness of breath. However, she has been experiencing issues with leg swelling, which she attributes to a history of hip replacement, arthroscopic knee surgery, a Baker's cyst, and prior DVT in her L leg. She manages the swelling with compression socks. The patient also reports struggling with weight loss and expresses interest in medication to assist with this.     Review of Systems  Gastrointestinal:  Positive for hemorrhoids.  See HPI     Studies Reviewed:   EKG Interpretation Date/Time:  Friday May 23 2023 11:29:46 EST Ventricular Rate:  67 PR Interval:    QRS Duration:  70 QT Interval:  400 QTC Calculation: 422 R Axis:   55  Text Interpretation: Normal sinus rhythm  with PACs - Atrial Bigeminy Normal Axis Non-specific ST-t changes No significant change since last tracing Confirmed by Tereso Newcomer [phone]) on 05/23/2023 11:38:19 AM           Risk Assessment/Calculations:             Physical Exam:   VS:  BP 120/60   Pulse 67   Ht 5\' 6"  (1.676 m)   Wt 189 lb 3.2 oz (85.8 kg)   SpO2 96%   BMI 30.54 kg/m    Wt Readings from Last 3 Encounters:  05/23/23 189 lb 3.2 oz (85.8 kg)  11/29/22 191 lb 1.9 oz (86.7 kg)  07/30/22 206 lb 12.8 oz (93.8 kg)    Constitutional:      Appearance: Healthy appearance. Not in distress.  Neck:     Vascular: JVD normal.  Pulmonary:     Breath sounds: Normal breath sounds. No wheezing. No rales.  Cardiovascular:     Normal rate. Regularly irregular rhythm.     Murmurs: There is no murmur.  Edema:    Peripheral edema present.    Pretibial: bilateral trace edema of the pretibial area. Abdominal:     Palpations: Abdomen is soft.        Assessment and Plan:   Assessment & Plan Coronary artery calcification seen on CT scan CCTA in 2019 with CAC score 12 and LAD plaque. She is not having chest pain to suggest angina. Continue Crestor 10 mg once daily.  NICM (nonischemic cardiomyopathy) (HCC)  AHA Stage B.  NYHA I.  Volume status stable. EF now low normal on most recent echocardiogram. Continue Lisinopril 2.5 mg once daily, Metoprolol succinate 25 mg once daily.  Pure hypercholesterolemia Continue Crestor 10 mg once daily. Obtain fasting Lipids today.  Premature atrial contractions Asymptomatic. Continue Metoprolol succinate 25 mg once daily. Heterozygous factor V Leiden mutation [facility]) She is on long term anticoagulation with Xarelto. She is followed by Hematology.           Dispo:  Return in about 1 year (around 05/22/2024) for Routine Follow Up, w/ Tereso Newcomer, PA-C.  Signed, Tereso Newcomer, PA-C

## 2023-05-23 ENCOUNTER — Encounter: Payer: Self-pay | Admitting: Physician Assistant

## 2023-05-23 ENCOUNTER — Ambulatory Visit: Payer: Medicare HMO | Attending: Physician Assistant | Admitting: Physician Assistant

## 2023-05-23 VITALS — BP 120/60 | HR 67 | Ht 66.0 in | Wt 189.2 lb

## 2023-05-23 DIAGNOSIS — I2510 Atherosclerotic heart disease of native coronary artery without angina pectoris: Secondary | ICD-10-CM

## 2023-05-23 DIAGNOSIS — I428 Other cardiomyopathies: Secondary | ICD-10-CM

## 2023-05-23 DIAGNOSIS — I491 Atrial premature depolarization: Secondary | ICD-10-CM | POA: Diagnosis not present

## 2023-05-23 DIAGNOSIS — D6851 Activated protein C resistance: Secondary | ICD-10-CM

## 2023-05-23 DIAGNOSIS — E7800 Pure hypercholesterolemia, unspecified: Secondary | ICD-10-CM | POA: Diagnosis not present

## 2023-05-23 MED ORDER — METOPROLOL SUCCINATE ER 25 MG PO TB24
25.0000 mg | ORAL_TABLET | Freq: Every day | ORAL | 3 refills | Status: DC
Start: 2023-05-23 — End: 2024-03-18

## 2023-05-23 MED ORDER — LISINOPRIL 5 MG PO TABS
ORAL_TABLET | ORAL | 3 refills | Status: DC
Start: 2023-05-23 — End: 2024-03-19

## 2023-05-23 MED ORDER — ROSUVASTATIN CALCIUM 10 MG PO TABS
ORAL_TABLET | ORAL | 3 refills | Status: DC
Start: 2023-05-23 — End: 2024-03-18

## 2023-05-23 NOTE — Assessment & Plan Note (Signed)
CCTA in 2019 with CAC score 12 and LAD plaque. She is not having chest pain to suggest angina. Continue Crestor 10 mg once daily.

## 2023-05-23 NOTE — Assessment & Plan Note (Signed)
She is on long term anticoagulation with Xarelto. She is followed by Hematology.

## 2023-05-23 NOTE — Patient Instructions (Signed)
Medication Instructions:  Your physician recommends that you continue on your current medications as directed. Please refer to the Current Medication list given to you today.  *If you need a refill on your cardiac medications before your next appointment, please call your pharmacy*   Lab Work: TODAY:  LIPID  If you have labs (blood work) drawn today and your tests are completely normal, you will receive your results only by: MyChart Message (if you have MyChart) OR A paper copy in the mail If you have any lab test that is abnormal or we need to change your treatment, we will call you to review the results.   Testing/Procedures: None ordered   Follow-Up: At [facility], you and your health needs are our priority.  As part of our continuing mission to provide you with exceptional heart care, we have created designated Provider Care Teams.  These Care Teams include your primary Cardiologist (physician) and Advanced Practice Providers (APPs -  Physician Assistants and Nurse Practitioners) who all work together to provide you with the care you need, when you need it.  We recommend signing up for the patient portal called "MyChart".  Sign up information is provided on this After Visit Summary.  MyChart is used to connect with patients for Virtual Visits (Telemedicine).  Patients are able to view lab/test results, encounter notes, upcoming appointments, etc.  Non-urgent messages can be sent to your provider as well.   To learn more about what you can do with MyChart, go to [email or web address].    Your next appointment:   12 month(s)  Provider:   Tonny Bollman, MD     Other Instructions

## 2023-05-23 NOTE — Assessment & Plan Note (Signed)
Asymptomatic. Continue Metoprolol succinate 25 mg once daily.

## 2023-05-23 NOTE — Assessment & Plan Note (Signed)
AHA Stage B.  NYHA I.  Volume status stable. EF now low normal on most recent echocardiogram. Continue Lisinopril 2.5 mg once daily, Metoprolol succinate 25 mg once daily.

## 2023-05-23 NOTE — Assessment & Plan Note (Signed)
Continue Crestor 10 mg once daily. Obtain fasting Lipids today.

## 2023-05-24 LAB — LIPID PANEL
Chol/HDL Ratio: 3.3 ratio (ref 0.0–4.4)
Cholesterol, Total: 160 mg/dL (ref 100–199)
HDL: 48 mg/dL (ref >39)
LDL Chol Calc (NIH): 98 mg/dL (ref 0–99)
Triglycerides: 71 mg/dL (ref 0–149)
VLDL Cholesterol Cal: 14 mg/dL (ref 5–40)

## 2023-06-02 ENCOUNTER — Inpatient Hospital Stay: Payer: Medicare HMO | Attending: Hematology & Oncology

## 2023-06-02 ENCOUNTER — Encounter: Payer: Self-pay | Admitting: Medical Oncology

## 2023-06-02 ENCOUNTER — Inpatient Hospital Stay: Payer: Medicare HMO | Admitting: Medical Oncology

## 2023-06-02 ENCOUNTER — Other Ambulatory Visit: Payer: Self-pay

## 2023-06-02 ENCOUNTER — Ambulatory Visit: Payer: Medicare HMO | Admitting: Family

## 2023-06-02 ENCOUNTER — Other Ambulatory Visit: Payer: Medicare HMO

## 2023-06-02 VITALS — BP 111/65 | HR 97 | Temp 98.0°F | Resp 18 | Ht 66.0 in | Wt 187.4 lb

## 2023-06-02 DIAGNOSIS — D6851 Activated protein C resistance: Secondary | ICD-10-CM | POA: Diagnosis not present

## 2023-06-02 DIAGNOSIS — Z86718 Personal history of other venous thrombosis and embolism: Secondary | ICD-10-CM | POA: Diagnosis not present

## 2023-06-02 DIAGNOSIS — Z7901 Long term (current) use of anticoagulants: Secondary | ICD-10-CM

## 2023-06-02 DIAGNOSIS — D500 Iron deficiency anemia secondary to blood loss (chronic): Secondary | ICD-10-CM

## 2023-06-02 DIAGNOSIS — I82512 Chronic embolism and thrombosis of left femoral vein: Secondary | ICD-10-CM | POA: Insufficient documentation

## 2023-06-02 LAB — CBC WITH DIFFERENTIAL (CANCER CENTER ONLY)
Abs Immature Granulocytes: 0.01 10*3/uL (ref 0.00–0.07)
Basophils Absolute: 0 10*3/uL (ref 0.0–0.1)
Basophils Relative: 1 %
Eosinophils Absolute: 0.1 10*3/uL (ref 0.0–0.5)
Eosinophils Relative: 3 %
HCT: 38.6 % (ref 36.0–46.0)
Hemoglobin: 13.1 g/dL (ref 12.0–15.0)
Immature Granulocytes: 0 %
Lymphocytes Relative: 23 %
Lymphs Abs: 1.1 10*3/uL (ref 0.7–4.0)
MCH: 29.4 pg (ref 26.0–34.0)
MCHC: 33.9 g/dL (ref 30.0–36.0)
MCV: 86.5 fL (ref 80.0–100.0)
Monocytes Absolute: 0.3 10*3/uL (ref 0.1–1.0)
Monocytes Relative: 7 %
Neutro Abs: 3.2 10*3/uL (ref 1.7–7.7)
Neutrophils Relative %: 66 %
Platelet Count: 142 10*3/uL — ABNORMAL LOW (ref 150–400)
RBC: 4.46 MIL/uL (ref 3.87–5.11)
RDW: 13.3 % (ref 11.5–15.5)
WBC Count: 4.8 10*3/uL (ref 4.0–10.5)
nRBC: 0 % (ref 0.0–0.2)

## 2023-06-02 LAB — CMP (CANCER CENTER ONLY)
ALT: 14 U/L (ref 0–44)
AST: 18 U/L (ref 15–41)
Albumin: 4.7 g/dL (ref 3.5–5.0)
Alkaline Phosphatase: 46 U/L (ref 38–126)
Anion gap: 7 (ref 5–15)
BUN: 21 mg/dL (ref 8–23)
CO2: 29 mmol/L (ref 22–32)
Calcium: 10.5 mg/dL — ABNORMAL HIGH (ref 8.9–10.3)
Chloride: 105 mmol/L (ref 98–111)
Creatinine: 0.75 mg/dL (ref 0.44–1.00)
GFR, Estimated: >60 mL/min (ref >60)
Glucose, Bld: 108 mg/dL — ABNORMAL HIGH (ref 70–99)
Potassium: 4.8 mmol/L (ref 3.5–5.1)
Sodium: 141 mmol/L (ref 135–145)
Total Bilirubin: 1.3 mg/dL — ABNORMAL HIGH (ref <1.2)
Total Protein: 7 g/dL (ref 6.5–8.1)

## 2023-06-02 LAB — RETICULOCYTES
Immature Retic Fract: 13.4 % (ref 2.3–15.9)
RBC.: 4.38 MIL/uL (ref 3.87–5.11)
Retic Count, Absolute: 78 10*3/uL (ref 19.0–186.0)
Retic Ct Pct: 1.8 % (ref 0.4–3.1)

## 2023-06-02 LAB — FERRITIN: Ferritin: 14 ng/mL (ref 11–307)

## 2023-06-02 NOTE — Progress Notes (Signed)
Hematology and Oncology Follow Up Visit  Opel Mansi [id number] 1946-05-09 77 y.o. 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 Zellman is here today for follow-up.   She reports that about 1 month ago she had two teeth removed and had some bleeding from this event. No other bleeding episodes  No clotting events since her original clot.  No other falls, no syncope.  No fever, chills, n/v, cough, rash, dizziness, SOB, chest pain, palpitations, abdominal pain or changes in bowel or bladder habits.  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  No swelling or tenderness in her extremities other than her chronic left leg swelling if she does not wear her compression stocking  Appetite and hydration are good.  Wt Readings from Last 3 Encounters:  06/02/23 187 lb 6.4 oz (85 kg)  05/23/23 189 lb 3.2 oz (85.8 kg)  11/29/22 191 lb 1.9 oz (86.7 kg)   ECOG Performance Status: 0 - Asymptomatic  Medications:  Allergies as of 06/02/2023       Reactions   Latex Rash   Penicillin G Rash, Other (See Comments)   DID THE REACTION INVOLVE: Swelling of the face/tongue/throat, SOB, or low BP? Unknown Sudden or severe rash/hives, skin peeling, or the inside of the mouth or nose? Unknown Did it require medical treatment? Unknown When did it last happen? Years ago If all above answers are "NO", may proceed with cephalosporin use.        Medication List        Accurate as of June 02, 2023  3:18 PM. If you have any questions, ask your nurse or doctor.          ALPRAZolam 0.25 MG tablet Commonly known as: XANAX Take 0.25 mg by mouth 2 (two) times daily as needed for anxiety or sleep.   famotidine 40 MG tablet Commonly known as:  PEPCID Take 40 mg by mouth daily.   FISH OIL PO Take 1 capsule by mouth daily.   lisinopril 5 MG tablet Commonly known as: ZESTRIL TAKE 1/2 TABLET BY MOUTH DAILY.   metoprolol succinate 25 MG 24 hr tablet Commonly known as: TOPROL-XL Take 1 tablet (25 mg total) by mouth daily.   polyethylene glycol powder 17 GM/SCOOP powder Commonly known as: GLYCOLAX/MIRALAX Take 1 Container by mouth as needed for mild constipation or moderate constipation.   PROBIOTIC-10 PO Take 1 capsule by mouth daily.   rosuvastatin 10 MG tablet Commonly known as: CRESTOR TAKE 1 TABLET BY MOUTH EVERY DAY.   VITAMIN D PO Take 1 tablet by mouth daily.   Xarelto 10 MG Tabs tablet Generic drug: rivaroxaban TAKE 1 TABLET BY MOUTH DAILY WITH SUPPER        Allergies:  Allergies  Allergen Reactions   Latex Rash   Penicillin G Rash and Other (See Comments)    DID THE REACTION INVOLVE: Swelling of the face/tongue/throat, SOB, or low BP? Unknown Sudden or severe rash/hives, skin peeling, or the inside of the mouth or nose? Unknown Did it require medical treatment? Unknown When did it last happen? Years ago If all above answers are "NO", may proceed with cephalosporin use.     Past Medical History, Surgical history,  Social history, and Family History were reviewed and updated.  Review of Systems: All other 10 point review of systems is negative.   Physical Exam:  height is 5\' 6"  (1.676 m) and weight is 187 lb 6.4 oz (85 kg). Her oral temperature is 98 F (36.7 C). Her blood pressure is 111/65 and her pulse is 97. Her respiration is 18 and oxygen saturation is 100%.   Wt Readings from Last 3 Encounters:  06/02/23 187 lb 6.4 oz (85 kg)  05/23/23 189 lb 3.2 oz (85.8 kg)  11/29/22 191 lb 1.9 oz (86.7 kg)    Ocular: Sclerae unicteric, pupils equal, round and reactive to light Ear-nose-throat: Oropharynx clear, dentition fair Lymphatic: No cervical or supraclavicular adenopathy Lungs no rales or  rhonchi, good excursion bilaterally Heart regular rate and rhythm, no murmur appreciated Abd soft, nontender, positive bowel sounds MSK no focal spinal tenderness, no joint edema Neuro: non-focal, well-oriented, appropriate affect  Lab Results  Component Value Date   WBC 4.8 06/02/2023   HGB 13.1 06/02/2023   HCT 38.6 06/02/2023   MCV 86.5 06/02/2023   PLT 142 (L) 06/02/2023   Lab Results  Component Value Date   FERRITIN 16 11/29/2022   IRON 49 11/29/2022   TIBC 367 11/29/2022   UIBC 318 11/29/2022   IRONPCTSAT 13 11/29/2022   Lab Results  Component Value Date   RETICCTPCT 1.8 06/02/2023   RBC 4.38 06/02/2023   No results found for: "KPAFRELGTCHN", "LAMBDASER", "KAPLAMBRATIO" No results found for: "IGGSERUM", "IGA", "IGMSERUM" No results found for: "TOTALPROTELP", "ALBUMINELP", "A1GS", "A2GS", "BETS", "BETA2SER", "GAMS", "MSPIKE", "SPEI"   Chemistry      Component Value Date/Time   NA 141 06/02/2023 1423   NA 143 03/10/2020 0903   NA 148 (H) 05/15/2017 1452   NA 143 07/10/2016 1119   K 4.8 06/02/2023 1423   K 4.4 05/15/2017 1452   K 4.4 07/10/2016 1119   CL 105 06/02/2023 1423   CL 106 05/15/2017 1452   CO2 29 06/02/2023 1423   CO2 26 05/15/2017 1452   CO2 25 07/10/2016 1119   BUN 21 06/02/2023 1423   BUN 18 03/10/2020 0903   BUN 16 05/15/2017 1452   BUN 14.7 07/10/2016 1119   CREATININE 0.75 06/02/2023 1423   CREATININE 0.7 05/15/2017 1452   CREATININE 0.8 07/10/2016 1119      Component Value Date/Time   CALCIUM 10.5 (H) 06/02/2023 1423   CALCIUM 10.3 05/15/2017 1452   CALCIUM 10.3 07/10/2016 1119   ALKPHOS 46 06/02/2023 1423   ALKPHOS 51 05/15/2017 1452   ALKPHOS 52 07/10/2016 1119   AST 18 06/02/2023 1423   AST 19 07/10/2016 1119   ALT 14 06/02/2023 1423   ALT 28 05/15/2017 1452   ALT 14 07/10/2016 1119   BILITOT 1.3 (H) 06/02/2023 1423   BILITOT 1.03 07/10/2016 1119     Encounter Diagnoses  Name Primary?   Iron deficiency anemia due to  chronic blood loss Yes   Heterozygous factor V Leiden mutation (HCC)    History of DVT (deep vein thrombosis)    Impression and Plan: Ms. Palla is a very pleasant 77 yo Caucasian female with history of DVT in the left leg and she is heterozygous for Factor V Leiden.   Continue Xarelto at current dose- CBC reviewed and stable to improved. CMP pending.  Iron studies pending  RTC 6 months APP, labs (CBC, CMP, retic, iron, ferritin).   Rushie Chestnut, PA-C 11/18/20243:18 PM

## 2023-06-03 LAB — IRON AND IRON BINDING CAPACITY (CC-WL,HP ONLY)
Iron: 53 ug/dL (ref 28–170)
Saturation Ratios: 12 % (ref 10.4–31.8)
TIBC: 437 ug/dL (ref 250–450)
UIBC: 384 ug/dL (ref 148–442)

## 2023-06-23 ENCOUNTER — Ambulatory Visit: Payer: Medicare HMO | Attending: Internal Medicine | Admitting: Physical Therapy

## 2023-06-23 DIAGNOSIS — R2681 Unsteadiness on feet: Secondary | ICD-10-CM | POA: Insufficient documentation

## 2023-06-23 DIAGNOSIS — M6281 Muscle weakness (generalized): Secondary | ICD-10-CM | POA: Diagnosis present

## 2023-06-23 NOTE — Therapy (Signed)
OUTPATIENT PHYSICAL THERAPY NEURO EVALUATION   Patient Name: Gloria Lambert MRN: [id number] DOB:[birth year], 77 y.o., female Today's Date: 06/23/2023   PCP: Gwenlyn Found, MD  REFERRING PROVIDER: Talmage Coin, MD  END OF SESSION:  PT End of Session - 06/23/23 1320     Visit Number 1    Number of Visits 7    Date for PT Re-Evaluation 08/22/23   due to delay in scheduling   Authorization Type [employer]    PT Start Time 1318    PT Stop Time 1359    PT Time Calculation (min) 41 min    Equipment Utilized During Treatment Gait belt    Activity Tolerance Patient tolerated treatment well    Behavior During Therapy WFL for tasks assessed/performed             Past Medical History:  Diagnosis Date   Baker's cyst of knee, right    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) 11/06/2015   Coronary artery calcification seen on CT scan    Coronary CTA 10/19:  Ca score 12 (49th percentile - intermediate risk); mid LAD calcified plaque; no obstructive CAD; Small to mod hiatal hernia   Depression    Dilated cardiomyopathy (HCC) 05/19/2018   Echo 04/10/18 - Mod LVH, EF 45-50, ant-lat HK, Gr 1 DD, trivial MR   Diverticulosis    Edema    GERD (gastroesophageal reflux disease)    Heart murmur 03/2018   Hemorrhoids    Heterozygous factor V Leiden mutation (HCC) 11/06/2015   History of colon polyps    History of echocardiogram    Echo 9/19: mod LVH, EF 45-50, ant-lat HK, Gr 1 DD, trivial MR   History of hiatal hernia 05/01/2018   Noted on CT   HLD (hyperlipidemia)    Hyperparathyroidism (HCC)    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 - surgery not recommended   OA (osteoarthritis)    Obese    Osteopenia    Palpitations    Cardiac monitor 9/21: NSR, average HR 66; no AFib/flutter; no high-grade heart block; rare PVCs and frequent supraventricular beats (isolated PACs, frequent short SVT runs); SVEs ~24%   Premature atrial contractions 10/03/2021   ZIO  monitor 03/2020 NSR, average heart rate 66 No atrial fibrillation/flutter No high-grade heart block or pathologic pauses PVCs and frequent supraventricular beats, PACs, frequent short supraventricular runs   Sleep apnea    does not use CPAP because of fit   Stress incontinence    Past Surgical History:  Procedure Laterality Date   arthroscopic knee surgery Right    BILATERAL OOPHORECTOMY  1998   BLADDER SUSPENSION  1998   COLONOSCOPY     HIP ARTHROPLASTY Right    total hip replacement   PARATHYROIDECTOMY N/A 07/30/2018   Procedure: PARATHYROIDECTOMY WITH NECK EXPLORATION;  Surgeon: Darnell Level, MD;  Location: [facility];  Service: General;  Laterality: N/A;   TONSILLECTOMY     VAGINAL HYSTERECTOMY  1998   Patient Active Problem List   Diagnosis Date Noted   IDA (iron deficiency anemia) 04/15/2022   Premature atrial contractions 10/03/2021   Primary hyperparathyroidism (HCC) 07/30/2018   NICM (nonischemic cardiomyopathy) (HCC) 05/19/2018   Pure hypercholesterolemia 05/19/2018   Coronary artery calcification seen on CT scan    Hyperparathyroidism (HCC) 03/31/2018   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 Couldn't get her arm down to catch herself. Thinks its the neuropathy (pt reports no one has diagnosed it as neuropathy) in her foot that hit the rug and fell down. Has muscle issues in her R hip. Goes to the neurologist  tomorrow. Does not feel off balance. 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 Takes steps one at a time because of the hips. Doesn't walk very fast. When she is a passenger in the car, has to lift her leg up.   Pt accompanied by: self  PERTINENT HISTORY: PMH: Chronic thrombus of L femoral vein, HLD, CAD, cardiomyopathy, hyperparathyroidism, osteoporosis, R hip replacement in 2008  PAIN:  Are you having pain? No and None sitting here right now, but does have pain in her hips when doing stairs   PRECAUTIONS: Fall  WEIGHT BEARING RESTRICTIONS: No  FALLS: Has patient fallen in last 6 months? Yes. Number of falls 1, when she tripped  LIVING ENVIRONMENT: Lives with: lives alone Lives in: House/apartment Stairs: Yes: External: 3 steps; on right going up Has following equipment at home:  Has a cane and walker from her husband (passed away from lung cancer), but does not use them. Pt uses a hospital bed due to acid reflux  PLOF: Independent and Leisure: plays pool daily at [employer]   PATIENT GOALS: Work on some leg strength, wants to learn how to get on and off the floor   OBJECTIVE:  Note: Objective measures were completed at Evaluation unless otherwise noted.   COGNITION: Overall cognitive status: Within functional limits for tasks assessed   SENSATION: WFL  COORDINATION: Heel to shin: Limited in R hip due to tightness/pain   POSTURE: rounded shoulders, forward head, and increased thoracic kyphosis  LOWER EXTREMITY ROM:    Limited R hip AROM into internal rotation, pt reporting tightness and pain in R hip flexors   LOWER EXTREMITY MMT:    MMT Right Eval Left Eval  Hip flexion 3- 4+  Hip extension    Hip abduction 3- 3-  Hip adduction    Hip internal rotation    Hip external rotation  Knee flexion 5 5  Knee extension 4+ 5  Ankle dorsiflexion 5 5  Ankle plantarflexion    Ankle inversion    Ankle eversion     (Blank rows = not tested)  BED MOBILITY:  Takes incr time to lift RLE into bed due to weakness   TRANSFERS: Assistive device utilized: None  Sit to stand: Modified independence Stand to sit: Modified independence Able to perform with no UE support   STAIRS: Level of Assistance: SBA Stair Negotiation Technique: Step to Pattern Forwards with Single Rail on Right Number of Stairs: 4  Height of Stairs: 6"  Comments: Performs with step to pattern due to history of hip surgery   GAIT: Gait pattern: decreased stride length, decreased hip/knee flexion- Right, trunk flexed, and wide BOS Distance walked: Clinic distances  Assistive device utilized: None Level of assistance: Modified independence   FUNCTIONAL TESTS:  30 seconds chair stand test: 11 sit <> stands with no UE support, pt reporting pain in R knee (<10 less than average for age) 10 meter walk test: 11.1 seconds = 2.95 ft/sec    [facility] PT Assessment - 06/23/23 1349       Functional Gait  Assessment   Gait assessed  Yes    Gait Level Surface Walks 20 ft in less than 7 sec but greater than 5.5 sec, uses assistive device, slower speed, mild gait deviations, or deviates 6-10 in outside of the 12 in walkway width.   6.6 seconds   Change in Gait Speed Able to smoothly change walking speed without loss of balance or gait deviation. Deviate no more than 6 in outside of the 12 in walkway width.    Gait with Horizontal Head Turns Performs head turns smoothly with no change in gait. Deviates no more than 6 in outside 12 in walkway width    Gait with Vertical Head Turns Performs head turns with no change in gait. Deviates no more than 6 in outside 12 in walkway width.    Gait and Pivot Turn Pivot turns safely within 3 sec and stops quickly with no loss of balance.    Step Over Obstacle Is able to step over one shoe box (4.5 in total height) without changing gait speed. No evidence of imbalance.    Gait with Narrow Base of Support  Ambulates less than 4 steps heel to toe or cannot perform without assistance.    Gait with Eyes Closed Walks 20 ft, slow speed, abnormal gait pattern, evidence for imbalance, deviates 10-15 in outside 12 in walkway width. Requires more than 9 sec to ambulate 20 ft.   9.3 seconds   Ambulating Backwards Walks 20 ft, no assistive devices, good speed, no evidence for imbalance, normal gait    Steps Two feet to a stair, must use rail.   due to pt's hips   Total Score 21    FGA comment: 21/30 =Moderate Fall Risk             TODAY'S TREATMENT:  N/A during eval   PATIENT EDUCATION: Education details: Clinical findings, POC, what PT will address with working on strengthening, balance, and floor transfers  Person educated: Patient Education method: Explanation Education comprehension: verbalized understanding  HOME EXERCISE PROGRAM: Will provide at future session.   GOALS: Goals reviewed with patient? Yes  SHORT TERM GOALS: Target date: 07/21/2023   Pt will be independent with initial HEP in order to build upon functional gains made in therapy. Baseline: Goal status: INITIAL  2.  mCTSIB to be assessed with LTG written.  Baseline:  Goal status: INITIAL    LONG TERM GOALS: Target date: 08/18/2023  Pt will be independent with initial HEP in order to build upon functional gains made in therapy. Baseline:  Goal status: INITIAL  2.  mCTSIB to be assessed with LTG written.  Baseline:  Goal status: INITIAL  3.  Pt will improve FGA to at least a 24/30 in order to demo decr fall risk.  Baseline: 21/30 Goal status: INITIAL  4.  Pt will be able to perform fall recovery with supervision in order to safely get on and off the floor.  Baseline:  Goal status: INITIAL  5.  Pt will improve gait speed with no AD to at least 3.2 ft/sec in order to demo improved  community mobility.   Baseline: 11.1 seconds = 2.95 ft/sec Goal status: INITIAL   ASSESSMENT:  CLINICAL IMPRESSION: Patient is a 77 year old female referred to Neuro OPPT for hx of falling.   Pt's PMH is significant for: Chronic thrombus of L femoral vein, HLD, CAD, cardiomyopathy, hyperparathyroidism, osteoporosis, R hip replacement in 2008. The following deficits were present during the exam: decr R hip AROM, muscle weakness (esp R hip musculature), impaired balance, gait abnormalities, postural abnormalities. Based on FGA, pt is a moderate risk of falls. Pt would benefit from skilled PT to address these impairments and functional limitations to maximize functional mobility independence and decr fall risk.    OBJECTIVE IMPAIRMENTS: Abnormal gait, decreased activity tolerance, decreased balance, decreased endurance, decreased mobility, decreased ROM, decreased strength, impaired flexibility, impaired sensation, postural dysfunction, and pain.   ACTIVITY LIMITATIONS: bending and locomotion level  PARTICIPATION LIMITATIONS: community activity  PERSONAL FACTORS: Age, Behavior pattern, Past/current experiences, Time since onset of injury/illness/exacerbation, and 3+ comorbidities: Chronic thrombus of L femoral vein, HLD, CAD, cardiomyopathy, hyperparathyroidism, osteoporosis, R hip replacement in 2008  are also affecting patient's functional outcome.   REHAB POTENTIAL: Good  CLINICAL DECISION MAKING: Stable/uncomplicated  EVALUATION COMPLEXITY: Low  PLAN:  PT FREQUENCY: 1x/week  PT DURATION: 8 weeks - due to delay in scheduling   PLANNED INTERVENTIONS: [id number]- PT Re-evaluation, 97110-Therapeutic exercises, 97530- Therapeutic activity, 97112- Neuromuscular re-education, 97535- Self Care, 56213- Manual therapy, [phone]- Gait training, Patient/Family education, Balance training, Stair training, Vestibular training, and DME instructions  PLAN FOR NEXT SESSION: Assess mCTSIB and write goal if  needed. Initial HEP for hip strengthening, ROM, and EC for balance.    Drake Leach, PT, DPT 06/23/2023, 2:56 PM

## 2023-06-24 ENCOUNTER — Ambulatory Visit: Payer: Medicare HMO | Admitting: Diagnostic Neuroimaging

## 2023-06-24 ENCOUNTER — Encounter: Payer: Self-pay | Admitting: Diagnostic Neuroimaging

## 2023-06-24 VITALS — BP 128/82 | HR 76 | Ht 66.0 in | Wt 191.6 lb

## 2023-06-24 DIAGNOSIS — G629 Polyneuropathy, unspecified: Secondary | ICD-10-CM | POA: Diagnosis not present

## 2023-06-24 NOTE — Patient Instructions (Signed)
  NUMBNESS IN FEET / ANKLES (mild, slow progression, since ~ 2019) - check labs  - foot hygiene reviewed - monitor sxs; may consider EMG/NCS if sxs significantly worsen

## 2023-06-24 NOTE — Progress Notes (Signed)
[facility]  PATIENT: Gloria Lambert DOB: 05/07/1946  REFERRING CLINICIAN: Gwenlyn Found, MD HISTORY FROM: patient  REASON FOR VISIT: new consult   HISTORICAL  CHIEF COMPLAINT:  Chief Complaint  Patient presents with   New Patient (Initial Visit)    Patient in room #6 and alone. Patient states she having numbness in both feet and shin. Patient states back in April she had a fall and she hit her head.    HISTORY OF PRESENT ILLNESS:   77 year old female here for evaluation of numbness and tingling.  Symptoms started about 5 years ago with numbness and tingling in the toes, feet and ankles.  Symptoms have minimally progressed over time.  No significant low back pain or radiating symptoms.  No neck pain.  No problems with arms or hands.   REVIEW OF SYSTEMS: Full 14 system review of systems performed and negative with exception of: as per HPI.  ALLERGIES: Allergies  Allergen Reactions   Latex Rash   Penicillin G Rash and Other (See Comments)    DID THE REACTION INVOLVE: Swelling of the face/tongue/throat, SOB, or low BP? Unknown Sudden or severe rash/hives, skin peeling, or the inside of the mouth or nose? Unknown Did it require medical treatment? Unknown When did it last happen? Years ago If all above answers are "NO", may proceed with cephalosporin use.     HOME MEDICATIONS: Outpatient Medications Prior to Visit  Medication Sig Dispense Refill   ALPRAZolam (XANAX) 0.25 MG tablet Take 0.25 mg by mouth 2 (two) times daily as needed for anxiety or sleep.     Cholecalciferol (VITAMIN D3) 1000 units CAPS 1 capsule Orally Once a day     citalopram (CELEXA) 10 MG tablet Take 10 mg by mouth daily.     famotidine (PEPCID) 40 MG tablet Take 40 mg by mouth daily.     lisinopril (ZESTRIL) 5 MG tablet TAKE 1/2 TABLET BY MOUTH DAILY. 90 tablet 3   metoprolol succinate (TOPROL-XL) 25 MG 24 hr tablet Take 1 tablet (25 mg total) by mouth daily. 90 tablet 3    Omega-3 Fatty Acids (FISH OIL PO) Take 1 capsule by mouth daily.      polyethylene glycol powder (GLYCOLAX/MIRALAX) 17 GM/SCOOP powder Take 1 Container by mouth as needed for mild constipation or moderate constipation.     Probiotic Product (PROBIOTIC-10 PO) Take 1 capsule by mouth daily.     rosuvastatin (CRESTOR) 10 MG tablet TAKE 1 TABLET BY MOUTH EVERY DAY. 90 tablet 3   VITAMIN D PO Take 1 tablet by mouth daily.      XARELTO 10 MG TABS tablet TAKE 1 TABLET BY MOUTH DAILY WITH SUPPER 30 tablet 6   No facility-administered medications prior to visit.    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) 11/06/2015   Coronary artery calcification seen on CT scan    Coronary CTA 10/19:  Ca score 12 (49th percentile - intermediate risk); mid LAD calcified plaque; no obstructive CAD; Small to mod hiatal hernia   Depression    Dilated cardiomyopathy (HCC) 05/19/2018   Echo 04/10/18 - Mod LVH, EF 45-50, ant-lat HK, Gr 1 DD, trivial MR   Diverticulosis    Edema    GERD (gastroesophageal reflux disease)    Heart murmur 03/2018   Hemorrhoids    Heterozygous factor V Leiden mutation (HCC) 11/06/2015  History of colon polyps    History of echocardiogram    Echo 9/19: mod LVH, EF 45-50, ant-lat HK, Gr 1 DD, trivial MR   History of hiatal hernia 05/01/2018   Noted on CT   HLD (hyperlipidemia)    Hyperparathyroidism (HCC)    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 - surgery not recommended   OA (osteoarthritis)    Obese    Osteopenia    Palpitations    Cardiac monitor 9/21: NSR, average HR 66; no AFib/flutter; no high-grade heart block; rare PVCs and frequent supraventricular beats (isolated PACs, frequent short SVT runs); SVEs ~24%   Premature atrial contractions 10/03/2021   ZIO monitor 03/2020 NSR, average heart rate 66 No atrial fibrillation/flutter No high-grade heart block or  pathologic pauses PVCs and frequent supraventricular beats, PACs, frequent short supraventricular runs   Sleep apnea    does not use CPAP because of fit   Stress incontinence     PAST SURGICAL HISTORY: Past Surgical History:  Procedure Laterality Date   arthroscopic knee surgery Right    BILATERAL OOPHORECTOMY  1997/07/17   BLADDER SUSPENSION  1998   COLONOSCOPY     HIP ARTHROPLASTY Right    total hip replacement   PARATHYROIDECTOMY N/A 07/30/2018   Procedure: PARATHYROIDECTOMY WITH NECK EXPLORATION;  Surgeon: Darnell Level, MD;  Location: [facility];  Service: General;  Laterality: N/A;   TONSILLECTOMY     VAGINAL HYSTERECTOMY  1998    FAMILY HISTORY: Family History  Problem Relation Age of Onset   Breast cancer Mother 81   Cancer Father        ?intestinal; ?renal   Heart attack Paternal Grandmother    Heart murmur Sister     SOCIAL HISTORY: Social History   Socioeconomic History   Marital status: Widowed    Spouse name: Not on file   Number of children: 2   Years of education: Not on file   Highest education level: Not on file  Occupational History   Occupation: retired    Comment: previous Diplomatic Services operational officer  Tobacco Use   Smoking status: Never   Smokeless tobacco: Never  Vaping Use   Vaping status: Never Used  Substance and Sexual Activity   Alcohol use: Not Currently    Alcohol/week: 0.0 standard drinks of alcohol    Comment: socaily   Drug use: No   Sexual activity: Not on file  Other Topics Concern   Not on file  Social History Narrative   Moved from [place], [place] in 18-Jul-2015   Husband died 15-Dec-2017 (cancer)   Retired   Chief Executive Officer Determinants of Corporate investment banker Strain: Not on file  Food Insecurity: Low Risk  (02/28/2023)   Received from [facility]   Hunger Vital Sign    Worried About Running Out of Food in the Last Year: Never true    Ran Out of Food in the Last Year: Never true  Transportation Needs: Not on file (02/28/2023)  Physical Activity: Not on file   Stress: Not on file  Social Connections: Not on file  Intimate Partner Violence: Low Risk  (10/23/2022)   Received from [facility]   Intimate Partner Violence    How often does anyone, including family and friends, physically hurt you?: Never    How often does anyone, including family and friends, insult or talk down to you?: Never    How often does anyone, including family and friends, threaten you with harm?: Never  How often does anyone, including family and friends, scream or curse at you?: Never     PHYSICAL EXAM  GENERAL EXAM/CONSTITUTIONAL: Vitals:  Vitals:   06/24/23 1205  BP: 128/82  Pulse: 76  Weight: 191 lb 9.6 oz (86.9 kg)  Height: 5\' 6"  (1.676 m)   Body mass index is 30.93 kg/m. Wt Readings from Last 3 Encounters:  06/24/23 191 lb 9.6 oz (86.9 kg)  06/02/23 187 lb 6.4 oz (85 kg)  05/23/23 189 lb 3.2 oz (85.8 kg)   Patient is in no distress; well developed, nourished and groomed; neck is supple  CARDIOVASCULAR: Examination of carotid arteries is normal; no carotid bruits Regular rate and rhythm, no murmurs Examination of peripheral vascular system by observation and palpation is normal  EYES: Ophthalmoscopic exam of optic discs and posterior segments is normal; no papilledema or hemorrhages No results found.  MUSCULOSKELETAL: Gait, strength, tone, movements noted in Neurologic exam below  NEUROLOGIC: MENTAL STATUS:      No data to display         awake, alert, oriented to person, place and time recent and remote memory intact normal attention and concentration language fluent, comprehension intact, naming intact fund of knowledge appropriate  CRANIAL NERVE:  2nd - no papilledema on fundoscopic exam 2nd, 3rd, 4th, 6th - pupils equal and reactive to light, visual fields full to confrontation, extraocular muscles intact, no nystagmus 5th - facial sensation symmetric 7th - facial strength symmetric 8th -  hearing intact 9th - palate elevates symmetrically, uvula midline 11th - shoulder shrug symmetric 12th - tongue protrusion midline  MOTOR:  normal bulk and tone, full strength in the BUE, BLE  SENSORY:  normal and symmetric to light touch, temperature, vibration  COORDINATION:  finger-nose-finger, fine finger movements normal  REFLEXES:  deep tendon reflexes present and symmetric; 2+ IN BUE; TRACE AT ANKLES  GAIT/STATION:  narrow based gait     DIAGNOSTIC DATA (LABS, IMAGING, TESTING) - I reviewed patient records, labs, notes, testing and imaging myself where available.  Lab Results  Component Value Date   WBC 4.8 06/02/2023   HGB 13.1 06/02/2023   HCT 38.6 06/02/2023   MCV 86.5 06/02/2023   PLT 142 (L) 06/02/2023      Component Value Date/Time   NA 141 06/02/2023 1423   NA 143 03/10/2020 0903   NA 148 (H) 05/15/2017 1452   NA 143 07/10/2016 1119   K 4.8 06/02/2023 1423   K 4.4 05/15/2017 1452   K 4.4 07/10/2016 1119   CL 105 06/02/2023 1423   CL 106 05/15/2017 1452   CO2 29 06/02/2023 1423   CO2 26 05/15/2017 1452   CO2 25 07/10/2016 1119   GLUCOSE 108 (H) 06/02/2023 1423   GLUCOSE 99 05/15/2017 1452   BUN 21 06/02/2023 1423   BUN 18 03/10/2020 0903   BUN 16 05/15/2017 1452   BUN 14.7 07/10/2016 1119   CREATININE 0.75 06/02/2023 1423   CREATININE 0.7 05/15/2017 1452   CREATININE 0.8 07/10/2016 1119   CALCIUM 10.5 (H) 06/02/2023 1423   CALCIUM 10.3 05/15/2017 1452   CALCIUM 10.3 07/10/2016 1119   PROT 7.0 06/02/2023 1423   PROT 6.0 06/16/2018 0954   PROT 6.9 05/15/2017 1452   PROT 7.3 07/10/2016 1119   ALBUMIN 4.7 06/02/2023 1423   ALBUMIN 4.2 06/16/2018 0954   ALBUMIN 3.9 07/10/2016 1119   AST 18 06/02/2023 1423   AST 19 07/10/2016 1119   ALT 14 06/02/2023 1423   ALT 28  05/15/2017 1452   ALT 14 07/10/2016 1119   ALKPHOS 46 06/02/2023 1423   ALKPHOS 51 05/15/2017 1452   ALKPHOS 52 07/10/2016 1119   BILITOT 1.3 (H) 06/02/2023 1423   BILITOT  1.03 07/10/2016 1119   GFRNONAA >60 06/02/2023 1423   GFRAA 101 03/10/2020 0903   GFRAA >60 02/15/2020 1115   Lab Results  Component Value Date   CHOL 160 05/23/2023   HDL 48 05/23/2023   LDLCALC 98 05/23/2023   TRIG 71 05/23/2023   CHOLHDL 3.3 05/23/2023   No results found for: "HGBA1C" No results found for: "VITAMINB12" Lab Results  Component Value Date   TSH 1.800 03/10/2020       ASSESSMENT AND PLAN  77 y.o. year old female here with:   Dx:  1. Neuropathy      PLAN:  NUMBNESS IN FEET / ANKLES (mild symptoms, slow progression, since ~ 2019) - check neuropathy labs  - foot hygiene reviewed - monitor sxs; may consider EMG/NCS if sxs significantly worsen  Orders Placed This Encounter  Procedures   Vitamin B12   Hemoglobin A1c   TSH Rfx on Abnormal to Free T4   Return for pending if symptoms worsen or fail to improve, pending test results.    Suanne Marker, MD 06/24/2023, 12:23 PM Certified in Neurology, Neurophysiology and Neuroimaging  [facility] [address], [address] [place], [place] [address] [phone]

## 2023-06-25 LAB — VITAMIN B12: Vitamin B-12: 496 pg/mL (ref 232–1245)

## 2023-06-25 LAB — HEMOGLOBIN A1C
Est. average glucose Bld gHb Est-mCnc: 117 mg/dL
Hgb A1c MFr Bld: 5.7 % — ABNORMAL HIGH (ref 4.8–5.6)

## 2023-06-25 LAB — TSH RFX ON ABNORMAL TO FREE T4: TSH: 0.991 u[IU]/mL (ref 0.450–4.500)

## 2023-07-01 ENCOUNTER — Ambulatory Visit: Payer: Medicare HMO | Admitting: Physical Therapy

## 2023-07-22 ENCOUNTER — Ambulatory Visit: Payer: Medicare HMO | Attending: Internal Medicine | Admitting: Physical Therapy

## 2023-07-22 ENCOUNTER — Encounter: Payer: Self-pay | Admitting: Physical Therapy

## 2023-07-22 VITALS — BP 122/49 | HR 74

## 2023-07-22 DIAGNOSIS — M6281 Muscle weakness (generalized): Secondary | ICD-10-CM | POA: Insufficient documentation

## 2023-07-22 DIAGNOSIS — R2681 Unsteadiness on feet: Secondary | ICD-10-CM | POA: Insufficient documentation

## 2023-07-22 NOTE — Therapy (Signed)
 OUTPATIENT PHYSICAL THERAPY NEURO TREATMENT   Patient Name: Gloria Lambert MRN: [id number] DOB:Jun 25, 1946, 78 y.o., female Today's Date: 07/22/2023   PCP: Leila Lucie LABOR, MD  REFERRING PROVIDER: Faythe Purchase, MD  END OF SESSION:  PT End of Session - 07/22/23 1329     Visit Number 2    Number of Visits 7    Date for PT Re-Evaluation 08/22/23   due to delay in scheduling   Authorization Type [employer]    PT Start Time 1316    PT Stop Time 1359    PT Time Calculation (min) 43 min    Equipment Utilized During Treatment Gait belt    Activity Tolerance Patient tolerated treatment well    Behavior During Therapy WFL for tasks assessed/performed             Past Medical History:  Diagnosis Date   Baker's cyst of knee, right    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) 11/06/2015   Coronary artery calcification seen on CT scan    Coronary CTA 10/19:  Ca score 12 (49th percentile - intermediate risk); mid LAD calcified plaque; no obstructive CAD; Small to mod hiatal hernia   Depression    Dilated cardiomyopathy (HCC) 05/19/2018   Echo 04/10/18 - Mod LVH, EF 45-50, ant-lat HK, Gr 1 DD, trivial MR   Diverticulosis    Edema    GERD (gastroesophageal reflux disease)    Heart murmur 03/2018   Hemorrhoids    Heterozygous factor V Leiden mutation (HCC) 11/06/2015   History of colon polyps    History of echocardiogram    Echo 9/19: mod LVH, EF 45-50, ant-lat HK, Gr 1 DD, trivial MR   History of hiatal hernia 05/01/2018   Noted on CT   HLD (hyperlipidemia)    Hyperparathyroidism (HCC)    dx with parathyroid  adenoma in the past - surgery not recommended   OA (osteoarthritis)    Obese    Osteopenia    Palpitations    Cardiac monitor 9/21: NSR, average HR 66; no AFib/flutter; no high-grade heart block; rare PVCs and frequent supraventricular beats (isolated PACs, frequent short SVT runs); SVEs ~24%   Premature atrial contractions 10/03/2021   ZIO monitor  03/2020 NSR, average heart rate 66 No atrial fibrillation/flutter No high-grade heart block or pathologic pauses PVCs and frequent supraventricular beats, PACs, frequent short supraventricular runs   Sleep apnea    does not use CPAP because of fit   Stress incontinence    Past Surgical History:  Procedure Laterality Date   arthroscopic knee surgery Right    BILATERAL OOPHORECTOMY  1998   BLADDER SUSPENSION  1998   COLONOSCOPY     HIP ARTHROPLASTY Right    total hip replacement   PARATHYROIDECTOMY N/A 07/30/2018   Procedure: PARATHYROIDECTOMY WITH NECK EXPLORATION;  Surgeon: Eletha Boas, MD;  Location: [facility];  Service: General;  Laterality: N/A;   TONSILLECTOMY     VAGINAL HYSTERECTOMY  1998   Patient Active Problem List   Diagnosis Date Noted   IDA (iron deficiency anemia) 04/15/2022   Premature atrial contractions 10/03/2021   Primary hyperparathyroidism (HCC) 07/30/2018   NICM (nonischemic cardiomyopathy) (HCC) 05/19/2018   Pure hypercholesterolemia 05/19/2018   Coronary artery calcification seen on CT scan    Hyperparathyroidism (HCC) 03/31/2018   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  No other instances or falls since this.  She has a recent trip to [place], [place] and has returned to therapy following this.  She endorses some ongoing/chronic R hip/anterior groin weakness/discomfort which has been  present since hip replacement in roughly 2008.  Pt accompanied by: self  PERTINENT HISTORY: PMH: Chronic thrombus of L femoral vein, HLD, CAD, cardiomyopathy, hyperparathyroidism, osteoporosis, R hip replacement in 2008  PAIN:  Are you having pain? No and None sitting here right now, but does have pain in her hips when doing stairs   PRECAUTIONS: Fall; pt cannot tolerate laying supine due to hiatal hernia and severe kyphosis  WEIGHT BEARING RESTRICTIONS: No  FALLS: Has patient fallen in last 6 months? Yes. Number of falls 1, when she tripped  LIVING ENVIRONMENT: Lives with: lives alone Lives in: House/apartment Stairs: Yes: External: 3 steps; on right going up Has following equipment at home:  Has a cane and walker from her husband (passed away from lung cancer), but does not use them. Pt uses a hospital bed due to acid reflux  PLOF: Independent and Leisure: plays pool daily at [employer] and [employer]   PATIENT GOALS: Work on some leg strength, wants to learn how to get on and off the floor   OBJECTIVE:  Note: Objective measures were completed at Evaluation unless otherwise noted.   COGNITION: Overall cognitive status: Within functional limits for tasks assessed   SENSATION: WFL  COORDINATION: Heel to shin: Limited in R hip due to tightness/pain   POSTURE: rounded shoulders, forward head, and increased thoracic kyphosis  LOWER EXTREMITY ROM:    Limited R hip AROM into internal rotation, pt reporting tightness and pain in R hip flexors   LOWER EXTREMITY MMT:    MMT Right Eval Left Eval  Hip flexion 3- 4+  Hip extension    Hip abduction 3- 3-  Hip adduction    Hip internal rotation    Hip external rotation    Knee flexion 5 5  Knee extension 4+ 5  Ankle dorsiflexion 5 5  Ankle plantarflexion    Ankle inversion    Ankle eversion    (Blank rows =  not tested)  BED MOBILITY:  Takes incr time to lift RLE into bed due to weakness    TRANSFERS: Assistive device utilized: None  Sit to stand: Modified independence Stand to sit: Modified independence Able to perform with no UE support   STAIRS: Level of Assistance: SBA Stair Negotiation Technique: Step to Pattern Forwards with Single Rail on Right Number of Stairs: 4  Height of Stairs: 6  Comments: Performs with step to pattern due to history of hip surgery   GAIT: Gait pattern: decreased stride length, decreased hip/knee flexion- Right, trunk flexed, and wide BOS Distance walked: Clinic distances  Assistive device utilized: None Level of assistance: Modified independence   FUNCTIONAL TESTS:  30 seconds chair stand test: 11 sit <> stands with no UE support, pt reporting pain in R knee (<10 less than average for age) 10 meter walk test: 11.1 seconds = 2.95 ft/sec      TODAY'S TREATMENT:                                                                                                                              mCTSIB: Condition 1:  30 sec; no visible sway Condition 2:  30 sec; no visible sway Condition 3:  30 sec; mild sway Condition 4:  30 sec; mild to moderate sway  -Attempted seated march over w/ pt limited in ROM > Seated march 3x20, mild right hip discomfort following -Standing 6 toe taps 3x20 alternating LE -Feet together eyes open x30 seconds > FT EC x30 seconds; discussed variable home setup for safety -Discussed fall recovery difficulties due to knees, will review in future session.  Did not reassess BP following exercise, but confirmed pt had no symptoms including lightheadedness or dizziness during standing tasks.  Answered questions about HEP at end of session.  PATIENT EDUCATION: Education details: Clinical findings, POC, what PT will address with working on strengthening, balance, and floor transfers  Person educated: Patient Education method: Explanation Education comprehension: verbalized understanding  HOME EXERCISE  PROGRAM: Access Code: [email or web address] URL: [email or web address] Date: 07/22/2023 Prepared by: Daved Bull  Exercises - Seated March  - 1 x daily - 4 x weekly - 2 sets - 20 reps - Step Taps on High Step  - 1 x daily - 4 x weekly - 3 sets - 20 reps - Standing with Feet Together on Foam Pad  - 1 x daily - 4 x weekly - 1 sets - 3 reps - 30 seconds hold - Romberg Stance Eyes Closed on Foam Pad  - 1 x daily - 4 x weekly - 1 sets - 3 reps - 30 seconds hold  GOALS: Goals reviewed with patient? Yes  SHORT TERM GOALS: Target date: 07/21/2023   Pt will be independent with initial HEP in order to build upon functional gains made in therapy. Baseline: Goal status: INITIAL  2.  mCTSIB to be assessed with LTG written.  Baseline: 120 sec (1/7) Goal status: MET  LONG TERM GOALS: Target date: 08/18/2023  Pt will be independent with initial HEP in order to build upon functional gains made in therapy. Baseline:  Goal status: INITIAL  2.  mCTSIB to be assessed with LTG written.  Baseline: No goal needed - 120 sec (1/7) Goal status: REVISED - D/C'd  3.  Pt will improve FGA to at least a 24/30 in order to demo decr fall risk.  Baseline: 21/30 Goal status: INITIAL  4.  Pt will be able to perform fall recovery with supervision in order to safely get on and off the floor.  Baseline:  Goal status: INITIAL  5.  Pt will improve gait speed with no AD to at least 3.2 ft/sec in order to demo improved community mobility.   Baseline: 11.1 seconds = 2.95 ft/sec Goal status: INITIAL   ASSESSMENT:  CLINICAL IMPRESSION: Patient performed well on mCTSIB this visit not requiring goal due to scoring 120/120.  Did have some sway with conditions 3 and 4 so added to initial HEP.  She remains limited by right hip flexor weakness and simultaneous possible tightness which we began to address this visit.  She would benefit from further work on progressive LE strengthening as well as review of  floor recovery.  She reports this has been difficult for her previously.  Will continue per POC w/ primary PT to determine need to reschedule initial missed visit in POC as pt inquires about this today.    OBJECTIVE IMPAIRMENTS: Abnormal gait, decreased activity tolerance, decreased balance, decreased endurance, decreased mobility, decreased ROM, decreased strength, impaired flexibility, impaired sensation, postural dysfunction, and pain.   ACTIVITY LIMITATIONS: bending and locomotion level  PARTICIPATION LIMITATIONS: community activity  PERSONAL FACTORS: Age, Behavior pattern, Past/current experiences, Time since onset of injury/illness/exacerbation, and 3+ comorbidities: Chronic thrombus of L femoral vein, HLD, CAD, cardiomyopathy, hyperparathyroidism, osteoporosis, R hip replacement in 2008  are also affecting patient's functional outcome.   REHAB POTENTIAL: Good  CLINICAL DECISION MAKING: Stable/uncomplicated  EVALUATION COMPLEXITY: Low  PLAN:  PT FREQUENCY: 1x/week  PT DURATION: 8 weeks - due to delay in scheduling   PLANNED INTERVENTIONS: 97164- PT Re-evaluation, 97110-Therapeutic exercises, 97530- Therapeutic activity, 97112- Neuromuscular re-education, 97535- Self Care, [id number]- Manual therapy, [phone]- Gait training, Patient/Family education, Balance training, Stair training, Vestibular training, and DME instructions  PLAN FOR NEXT SESSION:  Modifiy HEP for hip strengthening, ROM, and EC for balance.  Floor recovery?  Hip flexor stretching.  Should she make up missed visit/reschedule?   Daved KATHEE Bull, PT, DPT 07/22/2023, 2:09 PM

## 2023-07-22 NOTE — Patient Instructions (Signed)
 Access Code: [email or web address] URL: [email or web address] Date: 07/22/2023 Prepared by: Daved Bull  Exercises - Seated March  - 1 x daily - 4 x weekly - 2 sets - 20 reps - Step Taps on High Step  - 1 x daily - 4 x weekly - 3 sets - 20 reps - Standing with Feet Together on Foam Pad  - 1 x daily - 4 x weekly - 1 sets - 3 reps - 30 seconds hold - Romberg Stance Eyes Closed on Foam Pad  - 1 x daily - 4 x weekly - 1 sets - 3 reps - 30 seconds hold

## 2023-07-29 ENCOUNTER — Encounter: Payer: Self-pay | Admitting: Physical Therapy

## 2023-07-29 ENCOUNTER — Ambulatory Visit: Payer: Medicare HMO | Admitting: Physical Therapy

## 2023-07-29 DIAGNOSIS — R2681 Unsteadiness on feet: Secondary | ICD-10-CM

## 2023-07-29 DIAGNOSIS — M6281 Muscle weakness (generalized): Secondary | ICD-10-CM

## 2023-07-29 NOTE — Therapy (Signed)
 OUTPATIENT PHYSICAL THERAPY NEURO TREATMENT   Patient Name: Gloria Lambert MRN: [id number] DOB:October 22, 1945, 78 y.o., female Today's Date: 07/29/2023   PCP: Leila Lucie LABOR, MD  REFERRING PROVIDER: Faythe Purchase, MD  END OF SESSION:  PT End of Session - 07/29/23 1319     Visit Number 3    Number of Visits 7    Date for PT Re-Evaluation 08/22/23   due to delay in scheduling   Authorization Type [employer]    PT Start Time 1315    PT Stop Time 1355    PT Time Calculation (min) 40 min    Equipment Utilized During Treatment Gait belt    Activity Tolerance Patient tolerated treatment well   limited by R hip pain/discomfort   Behavior During Therapy [facility] for tasks assessed/performed             Past Medical History:  Diagnosis Date   Baker's cyst of knee, right    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) 11/06/2015   Coronary artery calcification seen on CT scan    Coronary CTA 10/19:  Ca score 12 (49th percentile - intermediate risk); mid LAD calcified plaque; no obstructive CAD; Small to mod hiatal hernia   Depression    Dilated cardiomyopathy (HCC) 05/19/2018   Echo 04/10/18 - Mod LVH, EF 45-50, ant-lat HK, Gr 1 DD, trivial MR   Diverticulosis    Edema    GERD (gastroesophageal reflux disease)    Heart murmur 03/2018   Hemorrhoids    Heterozygous factor V Leiden mutation (HCC) 11/06/2015   History of colon polyps    History of echocardiogram    Echo 9/19: mod LVH, EF 45-50, ant-lat HK, Gr 1 DD, trivial MR   History of hiatal hernia 05/01/2018   Noted on CT   HLD (hyperlipidemia)    Hyperparathyroidism (HCC)    dx with parathyroid  adenoma in the past - surgery not recommended   OA (osteoarthritis)    Obese    Osteopenia    Palpitations    Cardiac monitor 9/21: NSR, average HR 66; no AFib/flutter; no high-grade heart block; rare PVCs and frequent supraventricular beats (isolated PACs, frequent short SVT runs); SVEs ~24%   Premature atrial  contractions 10/03/2021   ZIO monitor 03/2020 NSR, average heart rate 66 No atrial fibrillation/flutter No high-grade heart block or pathologic pauses PVCs and frequent supraventricular beats, PACs, frequent short supraventricular runs   Sleep apnea    does not use CPAP because of fit   Stress incontinence    Past Surgical History:  Procedure Laterality Date   arthroscopic knee surgery Right    BILATERAL OOPHORECTOMY  1998   BLADDER SUSPENSION  1998   COLONOSCOPY     HIP ARTHROPLASTY Right    total hip replacement   PARATHYROIDECTOMY N/A 07/30/2018   Procedure: PARATHYROIDECTOMY WITH NECK EXPLORATION;  Surgeon: Eletha Boas, MD;  Location: [facility];  Service: General;  Laterality: N/A;   TONSILLECTOMY     VAGINAL HYSTERECTOMY  1998   Patient Active Problem List   Diagnosis Date Noted   IDA (iron deficiency anemia) 04/15/2022   Premature atrial contractions 10/03/2021   Primary hyperparathyroidism (HCC) 07/30/2018   NICM (nonischemic cardiomyopathy) (HCC) 05/19/2018   Pure hypercholesterolemia 05/19/2018   Coronary artery calcification seen on CT scan    Hyperparathyroidism (HCC) 03/31/2018   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 No falls.   Pt accompanied by: self  PERTINENT HISTORY: PMH: Chronic thrombus of L femoral vein, HLD, CAD, cardiomyopathy,  hyperparathyroidism, osteoporosis, R hip replacement in 2008  PAIN:  Are you having pain? No and None sitting here right now, but does have pain in her hips when doing stairs   PRECAUTIONS: Fall; pt cannot tolerate laying supine due to hiatal hernia and severe kyphosis  WEIGHT BEARING RESTRICTIONS: No  FALLS: Has patient fallen in last 6 months? Yes. Number of falls 1, when she tripped  LIVING ENVIRONMENT: Lives with: lives alone Lives in: House/apartment Stairs: Yes: External: 3 steps; on right going up Has following equipment at home:  Has a cane and walker from her husband (passed away from lung cancer), but does not use them. Pt uses a hospital bed due to acid reflux  PLOF: Independent and Leisure: plays pool daily at [employer] and [employer]   PATIENT GOALS: Work on some leg strength, wants to learn how to get on and off the floor   OBJECTIVE:  Note: Objective measures were completed at Evaluation unless otherwise noted.   COGNITION: Overall cognitive status: Within functional limits for tasks assessed   SENSATION: WFL  COORDINATION: Heel to shin: Limited in R hip due to tightness/pain   POSTURE: rounded shoulders, forward head, and increased thoracic kyphosis  LOWER EXTREMITY ROM:    Limited R hip AROM into internal rotation, pt reporting tightness and pain in R hip flexors   LOWER EXTREMITY MMT:    MMT Right Eval Left Eval  Hip flexion 3- 4+  Hip extension    Hip abduction 3- 3-  Hip adduction    Hip internal rotation    Hip external rotation    Knee flexion 5 5  Knee extension 4+ 5  Ankle dorsiflexion 5 5  Ankle plantarflexion    Ankle inversion    Ankle eversion    (Blank rows = not tested)  BED MOBILITY:  Takes incr time to lift RLE into bed due to weakness   TRANSFERS: Assistive device utilized: None  Sit to  stand: Modified independence Stand to sit: Modified independence Able to perform with no UE support   STAIRS: Level of  Assistance: SBA Stair Negotiation Technique: Step to Pattern Forwards with Single Rail on Right Number of Stairs: 4  Height of Stairs: 6  Comments: Performs with step to pattern due to history of hip surgery   GAIT: Gait pattern: decreased stride length, decreased hip/knee flexion- Right, trunk flexed, and wide BOS Distance walked: Clinic distances  Assistive device utilized: None Level of assistance: Modified independence   FUNCTIONAL TESTS:  30 seconds chair stand test: 11 sit <> stands with no UE support, pt reporting pain in R knee (<10 less than average for age) 10 meter walk test: 11.1 seconds = 2.95 ft/sec      TODAY'S TREATMENT:                                                                                                                               Therapeutic Activity: Showed pt where to purchase balance pad from [employer] to work on balance exercises at home as she could not find a good cushion or pillow to stand on.   Worked on floor transfers with CGA getting down on the floor with pt able to perform with stepping RLE back with BUE support on mat table. Once on floor, pt able to sit on bottom and then get on hands and knees to get BUE on mat table and get into tall kneel position, and then getting back up from the floor in half kneel, pt unable to get up with having LLE forwards, was able to perform with stepping RLE forwards with min A from therapist and pt needing to use BUE support, performed 2 reps. Pt also taking her shoes off to make it easier to perform without her shoes on (as pt reports she will not have shoes on at home). Pt reporting that she will go practice it at home. Discussed not safe to at this time as PT had to provide min A to help with getting pt up from half kneel position. Will need to further practice in PT.    Therapeutic Exercise: Attempted multiple different hip flexor stretches for pt to try at home due to tightness:  Standing at countertop into  a lunge with RLE, pt reporting feeling more in her quad  Supine hip flexor stretch off mat table, pt initially reporting no stretch, but then did feel more of a strech once PT lifted up the table, pt unable to lift up leg to bring it back on to the table, with assist needed from PT. Pt reporting discomfort afterwards  Sidelying on L side hip flexor stretch, pt reporting getting a cramp in her leg with it bent.  Seated in chair with no arm rest stretch, pt reporting feeling it more in quads and getting a cramp in R leg.  Tried hip flexor stretch again in standing with arm raise over head  which worked better for pt, performed 2 x 30 seconds with stretching R hip flexor, provided as HEP   PATIENT EDUCATION: Education details: See therapeutic activity section above, floor transfers, figuring out a hip flexor stretch to do at home and provided handout.  Person educated: Patient Education method: Explanation, Demonstration, Verbal cues, and Handouts Education comprehension: verbalized understanding, returned demonstration, verbal cues required, and needs further education  HOME EXERCISE PROGRAM: Access Code: [email or web address] URL: [email or web address] Date: 07/29/2023 Prepared by: Sheffield Senate  Exercises - Seated March  - 1 x daily - 4 x weekly - 2 sets - 20 reps - Step Taps on High Step  - 1 x daily - 4 x weekly - 3 sets - 20 reps - Standing with Feet Together on Foam Pad  - 1 x daily - 4 x weekly - 1 sets - 3 reps - 30 seconds hold - Romberg Stance Eyes Closed on Foam Pad  - 1 x daily - 4 x weekly - 1 sets - 3 reps - 30 seconds hold - Standing Hip Flexor Stretch  - 1 x daily - 5 x weekly - 3 sets - 30 hold  GOALS: Goals reviewed with patient? Yes  SHORT TERM GOALS: Target date: 07/21/2023   Pt will be independent with initial HEP in order to build upon functional gains made in therapy. Baseline: Goal status: MET  2.  mCTSIB to be assessed with LTG written.  Baseline: 120 sec  (1/7) Goal status: MET    LONG TERM GOALS: Target date: 08/18/2023  Pt will be independent with initial HEP in order to build upon functional gains made in therapy. Baseline:  Goal status: INITIAL  2.  mCTSIB to be assessed with LTG written.  Baseline: No goal needed - 120 sec (1/7) Goal status: REVISED - D/C'd  3.  Pt will improve FGA to at least a 24/30 in order to demo decr fall risk.  Baseline: 21/30 Goal status: INITIAL  4.  Pt will be able to perform fall recovery with supervision in order to safely get on and off the floor.  Baseline:  Goal status: INITIAL  5.  Pt will improve gait speed with no AD to at least 3.2 ft/sec in order to demo improved community mobility.   Baseline: 11.1 seconds = 2.95 ft/sec Goal status: INITIAL   ASSESSMENT:  CLINICAL IMPRESSION: Today's skilled session worked on different positions to try to find a good stretch for pt's R hip flexor. Pt unable to tolerate in supine, sidelying, and seated positions. The best stretch that worked for pt was a standing lunge and reached R arm overhead, added this to pt's HEP. Remainder of session worked on floor recovery strategies, with pt having difficulty due to weakness and decr ROM, pt needing min A to come to stand with BUE support. Will continue to practice in future sessions. Will continue per POC.    OBJECTIVE IMPAIRMENTS: Abnormal gait, decreased activity tolerance, decreased balance, decreased endurance, decreased mobility, decreased ROM, decreased strength, impaired flexibility, impaired sensation, postural dysfunction, and pain.   ACTIVITY LIMITATIONS: bending and locomotion level  PARTICIPATION LIMITATIONS: community activity  PERSONAL FACTORS: Age, Behavior pattern, Past/current experiences, Time since onset of injury/illness/exacerbation, and 3+ comorbidities: Chronic thrombus of L femoral vein, HLD, CAD, cardiomyopathy, hyperparathyroidism, osteoporosis, R hip replacement in 2008  are also  affecting patient's functional outcome.   REHAB POTENTIAL: Good  CLINICAL DECISION MAKING: Stable/uncomplicated  EVALUATION COMPLEXITY: Low  PLAN:  PT FREQUENCY: 1x/week  PT DURATION: 8  weeks - due to delay in scheduling   PLANNED INTERVENTIONS: 97164- PT Re-evaluation, 97110-Therapeutic exercises, 97530- Therapeutic activity, 97112- Neuromuscular re-education, 97535- Self Care, [id number]- Manual therapy, [phone]- Gait training, Patient/Family education, Balance training, Stair training, Vestibular training, and DME instructions  PLAN FOR NEXT SESSION:  Modifiy HEP for hip strengthening, ROM, and EC for balance.  Floor recovery - work on this again to see if pt can perform without assist.  Hip flexor stretching.     Sheffield LOISE Senate, PT, DPT 07/29/2023, 2:00 PM

## 2023-08-05 ENCOUNTER — Encounter: Payer: Self-pay | Admitting: Physical Therapy

## 2023-08-05 ENCOUNTER — Ambulatory Visit: Payer: Medicare HMO | Admitting: Physical Therapy

## 2023-08-05 DIAGNOSIS — R2681 Unsteadiness on feet: Secondary | ICD-10-CM

## 2023-08-05 DIAGNOSIS — M6281 Muscle weakness (generalized): Secondary | ICD-10-CM

## 2023-08-05 NOTE — Patient Instructions (Signed)
  Aquatic Therapy: What to Expect!  Where:  [facility] [address] [place], [place]  [address] [phone]  NOTE:  You will receive an automated phone message reminding you of your appointment and it will say the appointment is at the Rehab [facility] on [address]  We are working to fix this- just know that you will meet us at the pool!  How to Prepare: Please make sure you drink 8 ounces of water about one hour prior to your pool session A caregiver MUST attend the entire session with the patient.  The caregiver will be responsible for assisting with dressing as well as any toileting needs.  If the patient will be doing a home program this should likely be the person who will assist as well.  Patients must wear either their street shoes or pool shoes until they are ready to enter the pool with the therapist.  Patients must also wear either street shoes or pool shoes once exiting the pool to walk to the locker room.  This will helps us prevent slips and falls.  Please arrive 15 minutes early to prepare for your pool therapy session Sign in at the front desk on the clipboard marked for [facility] You may use the locker rooms on your right and then enter directly into the recreation pool (NOT the competition pool) Please make sure to attend to any toileting needs prior to entering the pool Please be dressed in your swim suit and on the pool deck at least 5 minutes before your appointment Once on the pool deck your therapist will ask you to sign the Patient  Consent and Assignment of Benefits form Your therapist may take your blood pressure prior to, during and after your session if indicated  About the pool  and parking: Entering the pool Your therapist will assist you; there are 2 ways to enter:  stairs with railings or with a chair lift.   Your therapist will determine the most appropriate way for you. Water temperature is usually between 86-87 degrees There  may be other swimmers in the pool at the same time Parking is free.   Contact Info:     Appointments: [facility]  All sessions are 45 minutes   [address]  [address]     Please call the [facility] if   [place], [place]   [address]    you need to cancel or reschedule an appointment.  [phone]       

## 2023-08-05 NOTE — Therapy (Signed)
OUTPATIENT PHYSICAL THERAPY NEURO TREATMENT - [facility]   Patient Name: Gloria Lambert MRN: [id number] DOB:09/08/1945, 78 y.o., female Today's Date: 08/05/2023   PCP: Gwenlyn Found, MD  REFERRING PROVIDER: Talmage Coin, MD  END OF SESSION:  PT End of Session - 08/05/23 1148     Visit Number 4    Number of Visits 7    Date for PT Re-Evaluation 08/22/23   due to delay in scheduling   Authorization Type [employer]    PT Start Time 1146    PT Stop Time 1228    PT Time Calculation (min) 42 min    Equipment Utilized During Treatment Gait belt    Activity Tolerance Patient tolerated treatment well   limited by R hip pain/discomfort   Behavior During Therapy [facility] for tasks assessed/performed             Past Medical History:  Diagnosis Date   Baker's cyst of knee, right    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) 11/06/2015   Coronary artery calcification seen on CT scan    Coronary CTA 10/19:  Ca score 12 (49th percentile - intermediate risk); mid LAD calcified plaque; no obstructive CAD; Small to mod hiatal hernia   Depression    Dilated cardiomyopathy (HCC) 05/19/2018   Echo 04/10/18 - Mod LVH, EF 45-50, ant-lat HK, Gr 1 DD, trivial MR   Diverticulosis    Edema    GERD (gastroesophageal reflux disease)    Heart murmur 03/2018   Hemorrhoids    Heterozygous factor V Leiden mutation (HCC) 11/06/2015   History of colon polyps    History of echocardiogram    Echo 9/19: mod LVH, EF 45-50, ant-lat HK, Gr 1 DD, trivial MR   History of hiatal hernia 05/01/2018   Noted on CT   HLD (hyperlipidemia)    Hyperparathyroidism (HCC)    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 - surgery not recommended   OA (osteoarthritis)    Obese    Osteopenia    Palpitations    Cardiac monitor 9/21: NSR, average HR 66; no AFib/flutter; no high-grade heart block; rare PVCs and frequent supraventricular beats (isolated PACs, frequent short SVT runs); SVEs ~24%   Premature  atrial contractions 10/03/2021   ZIO monitor 03/2020 NSR, average heart rate 66 No atrial fibrillation/flutter No high-grade heart block or pathologic pauses PVCs and frequent supraventricular beats, PACs, frequent short supraventricular runs   Sleep apnea    does not use CPAP because of fit   Stress incontinence    Past Surgical History:  Procedure Laterality Date   arthroscopic knee surgery Right    BILATERAL OOPHORECTOMY  1998   BLADDER SUSPENSION  1998   COLONOSCOPY     HIP ARTHROPLASTY Right    total hip replacement   PARATHYROIDECTOMY N/A 07/30/2018   Procedure: PARATHYROIDECTOMY WITH NECK EXPLORATION;  Surgeon: Darnell Level, MD;  Location: [facility];  Service: General;  Laterality: N/A;   TONSILLECTOMY     VAGINAL HYSTERECTOMY  1998   Patient Active Problem List   Diagnosis Date Noted   IDA (iron deficiency anemia) 04/15/2022   Premature atrial contractions 10/03/2021   Primary hyperparathyroidism (HCC) 07/30/2018   NICM (nonischemic cardiomyopathy) (HCC) 05/19/2018   Pure hypercholesterolemia 05/19/2018   Coronary artery calcification seen on CT scan    Hyperparathyroidism (HCC) 03/31/2018   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 Ordered her balance pad and it will get here on Saturday. Reports R knee was painful for 2 days after last visit due to knee pain.   Pt accompanied by: self  PERTINENT HISTORY:  PMH: Chronic thrombus of L femoral vein, HLD, CAD, cardiomyopathy, hyperparathyroidism, osteoporosis, R hip replacement in 2008  PAIN:  Are you having pain? No and None sitting here right now, but does have pain in her hips when doing stairs   PRECAUTIONS: Fall; pt cannot tolerate laying supine due to hiatal hernia and severe kyphosis  WEIGHT BEARING RESTRICTIONS: No  FALLS: Has patient fallen in last 6 months? Yes. Number of falls 1, when she tripped  LIVING ENVIRONMENT: Lives with: lives alone Lives in: House/apartment Stairs: Yes: External: 3 steps; on right going up Has following equipment at home:  Has a cane and walker from her husband (passed away from lung cancer), but does not use them. Pt uses a hospital bed due to acid reflux  PLOF: Independent and Leisure: plays pool daily at [employer]   PATIENT GOALS: Work on some leg strength, wants to learn how to get on and off the floor   OBJECTIVE:  Note: Objective measures were completed at Evaluation unless otherwise noted.   COGNITION: Overall cognitive status: Within functional limits for tasks assessed   SENSATION: WFL  COORDINATION: Heel to shin: Limited in R hip due to tightness/pain   POSTURE: rounded shoulders, forward head, and increased thoracic kyphosis  LOWER EXTREMITY ROM:    Limited R hip AROM into internal rotation, pt reporting tightness and pain in R hip flexors   LOWER EXTREMITY MMT:    MMT Right Eval Left Eval  Hip flexion 3- 4+  Hip extension    Hip abduction 3- 3-  Hip adduction    Hip internal rotation    Hip external rotation    Knee flexion 5 5  Knee extension 4+ 5  Ankle dorsiflexion 5 5  Ankle plantarflexion    Ankle inversion    Ankle eversion    (Blank rows = not tested)  BED MOBILITY:  Takes incr time to lift RLE into  bed due to weakness   TRANSFERS: Assistive device utilized: None  Sit to stand: Modified independence Stand to sit: Modified  independence Able to perform with no UE support   STAIRS: Level of Assistance: SBA Stair Negotiation Technique: Step to Pattern Forwards with Single Rail on Right Number of Stairs: 4  Height of Stairs: 6"  Comments: Performs with step to pattern due to history of hip surgery   GAIT: Gait pattern: decreased stride length, decreased hip/knee flexion- Right, trunk flexed, and wide BOS Distance walked: Clinic distances  Assistive device utilized: None Level of assistance: Modified independence   FUNCTIONAL TESTS:  30 seconds chair stand test: 11 sit <> stands with no UE support, pt reporting pain in R knee (<10 less than average for age) 10 meter walk test: 11.1 seconds = 2.95 ft/sec      TODAY'S TREATMENT:                                                                                                                               Therapeutic Exercise: NuStep with BUE/BLE at gear 6.0 for 8 minutes for strengthening, ROM, activity tolerance. Pt reporting RPE as 7-8/10 afterwards Side stepping with resistance band with use of red and green band around ankles, performed down and back a few times with pt with incr pain/discomfort in R hip and reporting not really feeling it in hip ABD Forward/retro monster walks with use of green t-band around ankles, down and back x3 reps, pt also reporting not really feeling it in hip ABD and having discomfort in R hip. Cued for gentle mini squat position Discussed following up with an orthopedist regarding continued R hip pain, pt reports has not seen an orthopedist Standing hip ABD with red t-band around ankles 2 x 10 reps, standing hip extension with red t-band around ankles 2 x 10 reps Verbal/demo cues for technique with pt able to tolerate these exercises much better. Added to HEP   PATIENT EDUCATION: Education details: Standing hip strengthening to HEP, discussed about aquatic therapy to work on strengthening/ROM as pt may be able to tolerate  exercises better than in the pool instead of on land, pt in agreement to try aquatic therapy - provided handout for this and got pt scheduled. Pt also looking into getting a membership at the [employer]  Person educated: Patient Education method: Explanation, Demonstration, Verbal cues, and Handouts Education comprehension: verbalized understanding, returned demonstration, verbal cues required, and needs further education  HOME EXERCISE PROGRAM: Access Code: [email or web address] URL: [email or web address] Date: 08/05/2023 Prepared by: Sherlie Ban  Exercises - Seated March  - 1 x daily - 4 x weekly - 2 sets - 20 reps - Step Taps on High Step  - 1 x daily - 4 x weekly - 3 sets - 20 reps - Standing with Feet Together on Foam Pad  - 1 x daily - 4 x weekly - 1 sets - 3 reps - 30 seconds hold -  Romberg Stance Eyes Closed on Foam Pad  - 1 x daily - 4 x weekly - 1 sets - 3 reps - 30 seconds hold - Standing Hip Flexor Stretch  - 1 x daily - 5 x weekly - 3 sets - 30 hold - Standing Hip Abduction with Resistance at Ankles and Counter Support  - 1 x daily - 3-4 x weekly - 2 sets - 10 reps - Hip Extension with Resistance Loop  - 1 x daily - 3-4 x weekly - 2 sets - 10 reps   GOALS: Goals reviewed with patient? Yes  SHORT TERM GOALS: Target date: 07/21/2023   Pt will be independent with initial HEP in order to build upon functional gains made in therapy. Baseline: Goal status: MET  2.  mCTSIB to be assessed with LTG written.  Baseline: 120 sec (1/7) Goal status: MET    LONG TERM GOALS: Target date: 08/18/2023  Pt will be independent with initial HEP in order to build upon functional gains made in therapy. Baseline:  Goal status: INITIAL  2.  mCTSIB to be assessed with LTG written.  Baseline: No goal needed - 120 sec (1/7) Goal status: REVISED - D/C'd  3.  Pt will improve FGA to at least a 24/30 in order to demo decr fall risk.  Baseline: 21/30 Goal status: INITIAL  4.  Pt will be  able to perform fall recovery with supervision in order to safely get on and off the floor.  Baseline:  Goal status: INITIAL  5.  Pt will improve gait speed with no AD to at least 3.2 ft/sec in order to demo improved community mobility.   Baseline: 11.1 seconds = 2.95 ft/sec Goal status: INITIAL   ASSESSMENT:  CLINICAL IMPRESSION: Today's skilled session focused on BLE strengthening with use of NuStep and resistance bands. Tried resisted side stepping and monster walks with resistance band, but pt unable to tolerate due to pain. Pt able to tolerated standing hip ABD/extension better instead with red t-band around ankles, so added to HEP. Re-cert perform to add aquatic therapy to POC to work on strengthening, R hip pain, and ROM. Pt also planning on looking into [facility], which has a pool. Will continue per POC.    OBJECTIVE IMPAIRMENTS: Abnormal gait, decreased activity tolerance, decreased balance, decreased endurance, decreased mobility, decreased ROM, decreased strength, impaired flexibility, impaired sensation, postural dysfunction, and pain.   ACTIVITY LIMITATIONS: bending and locomotion level  PARTICIPATION LIMITATIONS: community activity  PERSONAL FACTORS: Age, Behavior pattern, Past/current experiences, Time since onset of injury/illness/exacerbation, and 3+ comorbidities: Chronic thrombus of L femoral vein, HLD, CAD, cardiomyopathy, hyperparathyroidism, osteoporosis, R hip replacement in 2008  are also affecting patient's functional outcome.   REHAB POTENTIAL: Good  CLINICAL DECISION MAKING: Stable/uncomplicated  EVALUATION COMPLEXITY: Low  PLAN:  PT FREQUENCY: 1x/week  PT DURATION: 8 weeks - due to delay in scheduling   PLANNED INTERVENTIONS: 97164- PT Re-evaluation, 97110-Therapeutic exercises, 97530- Therapeutic activity, [id number]- Neuromuscular re-education, 97535- Self Care, 56213- Manual therapy, [id number]- Gait training, [phone]- Aquatic Therapy, Patient/Family  education, Balance training, Stair training, Vestibular training, and DME instructions  PLAN FOR NEXT SESSION:  Modifiy HEP for hip strengthening, ROM, and EC for balance.  Floor recovery - work on this again to see if pt can perform without assist.  Hip flexor stretching.     Drake Leach, PT, DPT 08/05/2023, 12:34 PM

## 2023-08-12 ENCOUNTER — Ambulatory Visit: Payer: Medicare HMO | Admitting: Physical Therapy

## 2023-08-14 ENCOUNTER — Ambulatory Visit: Payer: Medicare HMO | Admitting: Physical Therapy

## 2023-08-14 ENCOUNTER — Encounter: Payer: Self-pay | Admitting: Physical Therapy

## 2023-08-14 DIAGNOSIS — M6281 Muscle weakness (generalized): Secondary | ICD-10-CM

## 2023-08-14 DIAGNOSIS — R2681 Unsteadiness on feet: Secondary | ICD-10-CM | POA: Diagnosis not present

## 2023-08-14 NOTE — Patient Instructions (Signed)
Access Code: [email or web address] URL: [email or web address] Date: 08/14/2023 Prepared by: Camille Bal  Exercises - Standing Hip Abduction Adduction at [facility]  - 1 x daily - 7 x weekly - 3 sets - 10 reps - Standing Hip Flexion Extension at [employer]  - 1 x daily - 7 x weekly - 3 sets - 10 reps - Squat  - 1 x daily - 7 x weekly - 3 sets - 10 reps - Forward Walking  - 1 x daily - 7 x weekly - 3 sets - 10 reps - Backward Walking  - 1 x daily - 7 x weekly - 3 sets - 10 reps - Side Stepping  - 1 x daily - 7 x weekly - 3 sets - 10 reps - Forward March  - 1 x daily - 7 x weekly - 3 sets - 10 reps - Bilateral Shoulder Abduction Adduction AROM at Pool Wall  - 1 x daily - 7 x weekly - 3 sets - 10 reps - Plank with Hip Extension at [employer]  - 1 x daily - 7 x weekly - 3 sets - 10 reps - Side to Side Hamstring Stretch with Noodle at [employer]  - 1 x daily - 7 x weekly - 3 sets - 10 reps

## 2023-08-14 NOTE — Therapy (Signed)
OUTPATIENT PHYSICAL THERAPY NEURO TREATMENT   Patient Name: Gloria Lambert MRN: [id number] DOB:09-Jan-1946, 78 y.o., female Today's Date: 08/14/2023   PCP: Gwenlyn Found, MD  REFERRING PROVIDER: Talmage Coin, MD  END OF SESSION:  PT End of Session - 08/14/23 0941     Visit Number 5    Number of Visits 7    Date for PT Re-Evaluation 08/22/23   due to delay in scheduling   Authorization Type [employer]    PT Start Time 0845    PT Stop Time 0934    PT Time Calculation (min) 49 min    Equipment Utilized During Treatment Other (comment)   low resistance multicolored dumbbells, blue low resistance noodle   Activity Tolerance Patient tolerated treatment well   limited by R hip pain/discomfort   Behavior During Therapy [facility] for tasks assessed/performed             Past Medical History:  Diagnosis Date   Baker's cyst of knee, right    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) 11/06/2015   Coronary artery calcification seen on CT scan    Coronary CTA 10/19:  Ca score 12 (49th percentile - intermediate risk); mid LAD calcified plaque; no obstructive CAD; Small to mod hiatal hernia   Depression    Dilated cardiomyopathy (HCC) 05/19/2018   Echo 04/10/18 - Mod LVH, EF 45-50, ant-lat HK, Gr 1 DD, trivial MR   Diverticulosis    Edema    GERD (gastroesophageal reflux disease)    Heart murmur 03/2018   Hemorrhoids    Heterozygous factor V Leiden mutation (HCC) 11/06/2015   History of colon polyps    History of echocardiogram    Echo 9/19: mod LVH, EF 45-50, ant-lat HK, Gr 1 DD, trivial MR   History of hiatal hernia 05/01/2018   Noted on CT   HLD (hyperlipidemia)    Hyperparathyroidism (HCC)    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 - surgery not recommended   OA (osteoarthritis)    Obese    Osteopenia    Palpitations    Cardiac monitor 9/21: NSR, average HR 66; no AFib/flutter; no high-grade heart block; rare PVCs and frequent supraventricular beats  (isolated PACs, frequent short SVT runs); SVEs ~24%   Premature atrial contractions 10/03/2021   ZIO monitor 03/2020 NSR, average heart rate 66 No atrial fibrillation/flutter No high-grade heart block or pathologic pauses PVCs and frequent supraventricular beats, PACs, frequent short supraventricular runs   Sleep apnea    does not use CPAP because of fit   Stress incontinence    Past Surgical History:  Procedure Laterality Date   arthroscopic knee surgery Right    BILATERAL OOPHORECTOMY  1998   BLADDER SUSPENSION  1998   COLONOSCOPY     HIP ARTHROPLASTY Right    total hip replacement   PARATHYROIDECTOMY N/A 07/30/2018   Procedure: PARATHYROIDECTOMY WITH NECK EXPLORATION;  Surgeon: Darnell Level, MD;  Location: [facility];  Service: General;  Laterality: N/A;   TONSILLECTOMY     VAGINAL HYSTERECTOMY  1998   Patient Active Problem List   Diagnosis Date Noted   IDA (iron deficiency anemia) 04/15/2022   Premature atrial contractions 10/03/2021   Primary hyperparathyroidism (HCC) 07/30/2018   NICM (nonischemic cardiomyopathy) (HCC) 05/19/2018   Pure hypercholesterolemia 05/19/2018   Coronary artery calcification seen on CT scan    Hyperparathyroidism (HCC) 03/31/2018   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) 11/06/2015   Heterozygous  factor V Leiden mutation (HCC) 11/06/2015    ONSET DATE: 06/03/2023  REFERRING DIAG: Z91.81 (ICD-10-CM) - History of falling  THERAPY DIAG:  Unsteadiness on feet  Muscle weakness (generalized)  Rationale for Evaluation and Treatment: Rehabilitation  SUBJECTIVE:                                                                                                                                                                                             SUBJECTIVE STATEMENT: Pt reports her balance pad finally came in and she has been using it as it gives her more challenge than her pillows.  She states her left hip continues to bother her  sometimes into her lateral left calf.  She is unsure if this could be related to her chronic blood clot or if it's just tight.  She denies recent falls or acute changes.  She was able to check her insurance and she has silver sneakers and got her membership to the [facility] established.  She plans to go and use the pool there.   Pt accompanied by: self  PERTINENT HISTORY: PMH: Chronic thrombus of L femoral vein, HLD, CAD, cardiomyopathy, hyperparathyroidism, osteoporosis, R hip replacement in 2008  PAIN:  Are you having pain? No and None sitting here right now, but does have pain in her hips when doing stairs   PRECAUTIONS: Fall; pt cannot tolerate laying supine due to hiatal hernia and severe kyphosis  WEIGHT BEARING RESTRICTIONS: No  FALLS: Has patient fallen in last 6 months? Yes. Number of falls 1, when she tripped  LIVING ENVIRONMENT: Lives with: lives alone Lives in: House/apartment Stairs: Yes: External: 3 steps; on right going up Has following equipment at home:  Has a cane and walker from her husband (passed away from lung cancer), but does not use them. Pt uses a hospital bed due to acid reflux  PLOF: Independent and Leisure: plays pool daily at [employer]   PATIENT GOALS: Work on some leg strength, wants to learn how to get on and off the floor   OBJECTIVE:  Note: Objective measures were completed at Evaluation unless otherwise noted.   COGNITION: Overall cognitive status: Within functional limits for tasks assessed   SENSATION: WFL  COORDINATION: Heel to shin: Limited in R hip due to tightness/pain   POSTURE: rounded shoulders, forward head, and increased thoracic kyphosis  LOWER EXTREMITY ROM:    Limited R hip AROM into internal rotation, pt reporting tightness and pain in R hip flexors   LOWER EXTREMITY MMT:    MMT Right Eval Left Eval  Hip flexion 3- 4+  Hip extension  Hip abduction 3- 3-  Hip adduction    Hip internal rotation     Hip external rotation    Knee flexion 5 5  Knee extension 4+ 5  Ankle dorsiflexion 5 5  Ankle plantarflexion    Ankle inversion    Ankle eversion    (Blank rows = not tested)  BED MOBILITY:  Takes incr time to lift RLE into bed due to weakness   TRANSFERS: Assistive device utilized: None  Sit to stand: Modified independence Stand to sit: Modified independence Able to perform with no UE support   STAIRS: Level of Assistance: SBA Stair Negotiation Technique: Step to Pattern Forwards with Single Rail on Right Number of Stairs: 4  Height of Stairs: 6"  Comments: Performs with step to pattern due to history of hip surgery   GAIT: Gait pattern: decreased stride length, decreased hip/knee flexion- Right, trunk flexed, and wide BOS Distance walked: Clinic distances  Assistive device utilized: None Level of assistance: Modified independence   FUNCTIONAL TESTS:  30 seconds chair stand test: 11 sit <> stands with no UE support, pt reporting pain in R knee (<10 less than average for age) 10 meter walk test: 11.1 seconds = 2.95 ft/sec      TODAY'S TREATMENT:                                                                                                                               Aquatic therapy at [facility] - pool temperature 92 degrees   Patient seen for aquatic therapy today.  Treatment took place in water 3.6-4.5 feet deep depending upon activity.  Patient entered and exited the pool via stairs using reciprocal pattern at SBA level.   Exercises: Walking warmup:  4x18 ft forwards > backwards > laterally Walking march 6x18 ft Wall supported kickbacks ~40 Standing wall supported hip 3-way 2x15 each LE, pt has great form and posture Standing unsupported normal BOS multicolored DB flexion/abduction x50 each w/ cues for TrA activation and ankle strategy to prevent floating Wall supported squats 2x3 minutes at slow pace to increase hip mobility and glut engagement, pt denies  pain using normal stance Hamstring static stretch w/ blue noodle x1 minute each LE > lateral oscillations to increase IT band and adductor stretching 10x3 second hold each Tandem walking attempted with noodle at arms length then wrapped under arms and around chest for more support, pt continued to be unsuccessful with task so regressed to wall support with pt able to complete 4x15 ft from 49ft6in to 54ft water  Patient requires buoyancy of the water for support for reduced fall risk with gait training and balance exercises with SBA support. Exercises able to be performed safely in water without the risk of fall compared to those same exercises performed on land; viscosity of water needed for resistance for strengthening. Current of water provides perturbations for challenging static and dynamic balance.   PATIENT EDUCATION: Education details: PT informed pt  that she would print and lam[facility].  She is to discuss with land therapist re-cert to continue aquatic vs discharge to independent community based program.  Will provide aquatic program to land therapist. Person educated: Patient Education method: Explanation, Demonstration, Verbal cues, and Handouts Education comprehension: verbalized understanding, returned demonstration, verbal cues required, and needs further education  HOME EXERCISE PROGRAM: Access Code: [email or web address] URL: [email or web address] Date: 08/05/2023 Prepared by: Sherlie Ban  Exercises - Seated March  - 1 x daily - 4 x weekly - 2 sets - 20 reps - Step Taps on High Step  - 1 x daily - 4 x weekly - 3 sets - 20 reps - Standing with Feet Together on Foam Pad  - 1 x daily - 4 x weekly - 1 sets - 3 reps - 30 seconds hold - Romberg Stance Eyes Closed on Foam Pad  - 1 x daily - 4 x weekly - 1 sets - 3 reps - 30 seconds hold - Standing Hip Flexor Stretch  - 1 x daily - 5 x weekly - 3 sets - 30 hold -  Standing Hip Abduction with Resistance at Ankles and Counter Support  - 1 x daily - 3-4 x weekly - 2 sets - 10 reps - Hip Extension with Resistance Loop  - 1 x daily - 3-4 x weekly - 2 sets - 10 reps  Access Code: [email or web address] URL: [email or web address] Date: 08/14/2023 Prepared by: Camille Bal  Exercises - Standing Hip Abduction Adduction at [facility]  - 1 x daily - 7 x weekly - 3 sets - 10 reps - Standing Hip Flexion Extension at [employer]  - 1 x daily - 7 x weekly - 3 sets - 10 reps - Squat  - 1 x daily - 7 x weekly - 3 sets - 10 reps - Forward Walking  - 1 x daily - 7 x weekly - 3 sets - 10 reps - Backward Walking  - 1 x daily - 7 x weekly - 3 sets - 10 reps - Side Stepping  - 1 x daily - 7 x weekly - 3 sets - 10 reps - Forward March  - 1 x daily - 7 x weekly - 3 sets - 10 reps - Bilateral Shoulder Abduction Adduction AROM at Pool Wall  - 1 x daily - 7 x weekly - 3 sets - 10 reps - Plank with Hip Extension at [employer]  - 1 x daily - 7 x weekly - 3 sets - 10 reps - Side to Side Hamstring Stretch with Noodle at [employer]  - 1 x daily - 7 x weekly - 3 sets - 10 reps  GOALS: Goals reviewed with patient? Yes  SHORT TERM GOALS: Target date: 07/21/2023   Pt will be independent with initial HEP in order to build upon functional gains made in therapy. Baseline: Goal status: MET  2.  mCTSIB to be assessed with LTG written.  Baseline: 120 sec (1/7) Goal status: MET    LONG TERM GOALS: Target date: 08/18/2023  Pt will be independent with initial HEP in order to build upon functional gains made in therapy. Baseline:  Goal status: INITIAL  2.  mCTSIB to be assessed with LTG written.  Baseline: No goal needed - 120 sec (1/7) Goal status: REVISED - D/C'd  3.  Pt will improve FGA to at least a 24/30 in order to demo  decr fall risk.  Baseline: 21/30 Goal status: INITIAL  4.  Pt will be able to perform fall recovery with supervision in order to safely get on and off  the floor.  Baseline:  Goal status: INITIAL  5.  Pt will improve gait speed with no AD to at least 3.2 ft/sec in order to demo improved community mobility.   Baseline: 11.1 seconds = 2.95 ft/sec Goal status: INITIAL   ASSESSMENT:  CLINICAL IMPRESSION: Focus of skilled PT session today on establishing an aquatic routine for pt in the event that she continues in community setting following possible discharge from land PT.  Aquatic therapist will communicate with land PT regarding pt benefit from continuing aquatic if they feel pt made progress following session today.  Pt tolerated all challenges well with no increased pain in high temperature setting.  Will continue per POC and provide initial aquatic HEP to pt at next land therapy appt.   OBJECTIVE IMPAIRMENTS: Abnormal gait, decreased activity tolerance, decreased balance, decreased endurance, decreased mobility, decreased ROM, decreased strength, impaired flexibility, impaired sensation, postural dysfunction, and pain.   ACTIVITY LIMITATIONS: bending and locomotion level  PARTICIPATION LIMITATIONS: community activity  PERSONAL FACTORS: Age, Behavior pattern, Past/current experiences, Time since onset of injury/illness/exacerbation, and 3+ comorbidities: Chronic thrombus of L femoral vein, HLD, CAD, cardiomyopathy, hyperparathyroidism, osteoporosis, R hip replacement in 2008  are also affecting patient's functional outcome.   REHAB POTENTIAL: Good  CLINICAL DECISION MAKING: Stable/uncomplicated  EVALUATION COMPLEXITY: Low  PLAN:  PT FREQUENCY: 1x/week  PT DURATION: 8 weeks - due to delay in scheduling   PLANNED INTERVENTIONS: 97164- PT Re-evaluation, 97110-Therapeutic exercises, 97530- Therapeutic activity, [id number]- Neuromuscular re-education, 97535- Self Care, [id number]- Manual therapy, [id number]- Gait training, [phone]- Aquatic Therapy, Patient/Family education, Balance training, Stair training, Vestibular training, and DME  instructions  PLAN FOR NEXT SESSION:  Modifiy HEP for hip strengthening, ROM, and EC for balance.  Floor recovery - work on this again to see if pt can perform without assist.  Hip flexor stretching.    Layci Stenglein will have laminated HEP for pt's next land appt!   Sadie Haber, PT, DPT 08/14/2023, 9:43 AM

## 2023-08-19 ENCOUNTER — Encounter: Payer: Self-pay | Admitting: Physical Therapy

## 2023-08-19 ENCOUNTER — Ambulatory Visit: Payer: Medicare HMO | Attending: Internal Medicine | Admitting: Physical Therapy

## 2023-08-19 DIAGNOSIS — R2681 Unsteadiness on feet: Secondary | ICD-10-CM | POA: Diagnosis present

## 2023-08-19 DIAGNOSIS — M6281 Muscle weakness (generalized): Secondary | ICD-10-CM | POA: Insufficient documentation

## 2023-08-19 NOTE — Therapy (Signed)
 OUTPATIENT PHYSICAL THERAPY NEURO TREATMENT/RE-CERT   Patient Name: Gloria Lambert MRN: [id number] DOB:1946-02-12, 78 y.o., female Today's Date: 08/19/2023   PCP: Leila Lucie LABOR, MD  REFERRING PROVIDER: Faythe Purchase, MD  END OF SESSION:  PT End of Session - 08/19/23 1322     Visit Number 6    Number of Visits 7    Date for PT Re-Evaluation 09/18/23   per re-cert on 01/16/73   Authorization Type [employer]    PT Start Time 1319    PT Stop Time 1357    PT Time Calculation (min) 38 min    Equipment Utilized During Treatment Gait belt    Activity Tolerance Patient tolerated treatment well   limited by hip discomfort   Behavior During Therapy WFL for tasks assessed/performed             Past Medical History:  Diagnosis Date   Baker's cyst of knee, right    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) 11/06/2015   Coronary artery calcification seen on CT scan    Coronary CTA 10/19:  Ca score 12 (49th percentile - intermediate risk); mid LAD calcified plaque; no obstructive CAD; Small to mod hiatal hernia   Depression    Dilated cardiomyopathy (HCC) 05/19/2018   Echo 04/10/18 - Mod LVH, EF 45-50, ant-lat HK, Gr 1 DD, trivial MR   Diverticulosis    Edema    GERD (gastroesophageal reflux disease)    Heart murmur 03/2018   Hemorrhoids    Heterozygous factor V Leiden mutation (HCC) 11/06/2015   History of colon polyps    History of echocardiogram    Echo 9/19: mod LVH, EF 45-50, ant-lat HK, Gr 1 DD, trivial MR   History of hiatal hernia 05/01/2018   Noted on CT   HLD (hyperlipidemia)    Hyperparathyroidism (HCC)    dx with parathyroid  adenoma in the past - surgery not recommended   OA (osteoarthritis)    Obese    Osteopenia    Palpitations    Cardiac monitor 9/21: NSR, average HR 66; no AFib/flutter; no high-grade heart block; rare PVCs and frequent supraventricular beats (isolated PACs, frequent short SVT runs); SVEs ~24%   Premature atrial  contractions 10/03/2021   ZIO monitor 03/2020 NSR, average heart rate 66 No atrial fibrillation/flutter No high-grade heart block or pathologic pauses PVCs and frequent supraventricular beats, PACs, frequent short supraventricular runs   Sleep apnea    does not use CPAP because of fit   Stress incontinence    Past Surgical History:  Procedure Laterality Date   arthroscopic knee surgery Right    BILATERAL OOPHORECTOMY  1998   BLADDER SUSPENSION  1998   COLONOSCOPY     HIP ARTHROPLASTY Right    total hip replacement   PARATHYROIDECTOMY N/A 07/30/2018   Procedure: PARATHYROIDECTOMY WITH NECK EXPLORATION;  Surgeon: Eletha Boas, MD;  Location: [facility];  Service: General;  Laterality: N/A;   TONSILLECTOMY     VAGINAL HYSTERECTOMY  1998   Patient Active Problem List   Diagnosis Date Noted   IDA (iron deficiency anemia) 04/15/2022   Premature atrial contractions 10/03/2021   Primary hyperparathyroidism (HCC) 07/30/2018   NICM (nonischemic cardiomyopathy) (HCC) 05/19/2018   Pure hypercholesterolemia 05/19/2018   Coronary artery calcification seen on CT scan    Hyperparathyroidism (HCC) 03/31/2018   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 Reports her L hip is bothersome into her L thigh and anterior lower leg. Not sure what happened. Just feels it when sitting for a while then having to stand up. Reports once she is walking she is good. Reports balance is  doing fine. Doesn't feel like she is going to fall. Reports still is tough going up stairs with left leg. Pt reports she is a member at [employer]. Likes using her balance pad for her balance.   Pt accompanied by: self  PERTINENT HISTORY: PMH: Chronic thrombus of L femoral vein, HLD, CAD, cardiomyopathy, hyperparathyroidism, osteoporosis, R hip replacement in 2008  PAIN:  Are you having pain? No and None sitting here right now, but reports incr pain when she gets up. Reports as 6/10    PRECAUTIONS: Fall; pt cannot tolerate laying supine due to hiatal hernia and severe kyphosis  WEIGHT BEARING RESTRICTIONS: No  FALLS: Has patient fallen in last 6 months? Yes. Number of falls 1, when she tripped  LIVING ENVIRONMENT: Lives with: lives alone Lives in: House/apartment Stairs: Yes: External: 3 steps; on right going up Has following equipment at home:  Has a cane and walker from her husband (passed away from lung cancer), but does not use them. Pt uses a hospital bed due to acid reflux  PLOF: Independent and Leisure: plays pool daily at [employer]   PATIENT GOALS: Work on some leg strength, wants to learn how to get on and off the floor   OBJECTIVE:  Note: Objective measures were completed at Evaluation unless otherwise noted.   COGNITION: Overall cognitive status: Within functional limits for tasks assessed   SENSATION: WFL  COORDINATION: Heel to shin: Limited in R hip due to tightness/pain   POSTURE: rounded shoulders, forward head, and increased thoracic kyphosis  LOWER EXTREMITY ROM:    Limited R hip AROM into internal rotation, pt reporting tightness and pain in R hip flexors   LOWER EXTREMITY MMT:    MMT Right Eval Left Eval  Hip flexion 3- 4+  Hip extension    Hip abduction 3- 3-  Hip adduction    Hip internal rotation    Hip external rotation  Knee flexion 5 5  Knee extension 4+ 5  Ankle dorsiflexion 5 5  Ankle plantarflexion    Ankle  inversion    Ankle eversion    (Blank rows = not tested)  BED MOBILITY:  Takes incr time to lift RLE into bed due to weakness   TRANSFERS: Assistive device utilized: None  Sit to stand: Modified independence Stand to sit: Modified independence Able to perform with no UE support   STAIRS: Level of Assistance: SBA Stair Negotiation Technique: Step to Pattern Forwards with Single Rail on Right Number of Stairs: 4  Height of Stairs: 6  Comments: Performs with step to pattern due to history of hip surgery   GAIT: Gait pattern: decreased stride length, decreased hip/knee flexion- Right, trunk flexed, and wide BOS Distance walked: Clinic distances  Assistive device utilized: None Level of assistance: Modified independence   FUNCTIONAL TESTS:  30 seconds chair stand test: 11 sit <> stands with no UE support, pt reporting pain in R knee (<10 less than average for age) 10 meter walk test: 11.1 seconds = 2.95 ft/sec    [facility] PT Assessment - 08/19/23 1332       Functional Gait  Assessment   Gait assessed  Yes    Gait Level Surface Walks 20 ft in less than 7 sec but greater than 5.5 sec, uses assistive device, slower speed, mild gait deviations, or deviates 6-10 in outside of the 12 in walkway width.   6.8   Change in Gait Speed Able to smoothly change walking speed without loss of balance or gait deviation. Deviate no more than 6 in outside of the 12 in walkway width.    Gait with Horizontal Head Turns Performs head turns smoothly with no change in gait. Deviates no more than 6 in outside 12 in walkway width    Gait with Vertical Head Turns Performs head turns with no change in gait. Deviates no more than 6 in outside 12 in walkway width.    Gait and Pivot Turn Pivot turns safely within 3 sec and stops quickly with no loss of balance.    Step Over Obstacle Is able to step over 2 stacked shoe boxes taped together (9 in total height) without changing gait speed. No evidence of imbalance.     Gait with Narrow Base of Support Ambulates less than 4 steps heel to toe or cannot perform without assistance.    Gait with Eyes Closed Walks 20 ft, slow speed, abnormal gait pattern, evidence for imbalance, deviates 10-15 in outside 12 in walkway width. Requires more than 9 sec to ambulate 20 ft.   10.4 seconds   Ambulating Backwards Walks 20 ft, no assistive devices, good speed, no evidence for imbalance, normal gait    Steps Two feet to a stair, must use rail.   due to pt's hips   Total Score 22    FGA comment: 22/30 = Moderate Fall Risk              TODAY'S TREATMENT:  Therapeutic Activity: Discussed D/C from land therapy at this time due to pt reaching her maximum potential with land PT at this time. Added one more pool appt to continue to work on gait/mobility/balance with pt in agreement with this plan. Pt continues with hip discomfort (now on L side). Encouraged pt to follow up with orthopedic doctor/PCP regarding this. Discussed continuing HEP for land therapy and for aquatic in the pool. Pt joined [employer], discussed pt can also try the [employer]/recumbent bike for aerobic activity/ROM  Gait speed with no AD: 10.6 seconds = 3.1 ft/sec   Seated piriformis stretch 3 x 30-45 seconds to L side, pt reporting this targeting the spot in her hip that feels tight  Seated hamstring stretch with LLE 2 x 30 seconds  Added above to HEP for mobility/stretching and provided handout    [facility] PT Assessment - 08/19/23 1332       Functional Gait  Assessment   Gait assessed  Yes    Gait Level Surface Walks 20 ft in less than 7 sec but greater than 5.5 sec, uses assistive device, slower speed, mild gait deviations, or deviates 6-10 in outside of the 12 in walkway width.   6.8   Change in Gait Speed Able to smoothly change walking speed without loss of balance or gait  deviation. Deviate no more than 6 in outside of the 12 in walkway width.    Gait with Horizontal Head Turns Performs head turns smoothly with no change in gait. Deviates no more than 6 in outside 12 in walkway width    Gait with Vertical Head Turns Performs head turns with no change in gait. Deviates no more than 6 in outside 12 in walkway width.    Gait and Pivot Turn Pivot turns safely within 3 sec and stops quickly with no loss of balance.    Step Over Obstacle Is able to step over 2 stacked shoe boxes taped together (9 in total height) without changing gait speed. No evidence of imbalance.    Gait with Narrow Base of Support Ambulates less than 4 steps heel to toe or cannot perform without assistance.    Gait with Eyes Closed Walks 20 ft, slow speed, abnormal gait pattern, evidence for imbalance, deviates 10-15 in outside 12 in walkway width. Requires more than 9 sec to ambulate 20 ft.   10.4 seconds   Ambulating Backwards Walks 20 ft, no assistive devices, good speed, no evidence for imbalance, normal gait    Steps Two feet to a stair, must use rail.   due to pt's hips   Total Score 22    FGA comment: 22/30 = Moderate Fall Risk              PATIENT EDUCATION: Education details: See therapeutic activity section above, D/C from PT  Person educated: Patient Education method: Explanation, Demonstration, Verbal cues, and Handouts Education comprehension: verbalized understanding, returned demonstration, verbal cues required, and needs further education  HOME EXERCISE PROGRAM: Access Code: [email or web address] URL: [email or web address] Date: 08/19/2023 Prepared by: Sheffield Senate  Exercises - Seated March  - 1 x daily - 4 x weekly - 2 sets - 20 reps - Standing with Feet Together on Foam Pad  - 1 x daily - 4 x weekly - 1 sets - 3 reps - 30 seconds hold - Romberg Stance Eyes Closed on Foam Pad  - 1 x daily - 4 x weekly - 1 sets - 3 reps - 30 seconds hold - Standing Hip  Flexor  Stretch  - 1 x daily - 5 x weekly - 3 sets - 30 hold - Standing Hip Abduction with Resistance at Ankles and Counter Support  - 1 x daily - 3-4 x weekly - 2 sets - 10 reps - Hip Extension with Resistance Loop  - 1 x daily - 3-4 x weekly - 2 sets - 10 reps - Seated Figure 4 Piriformis Stretch  - 1 x daily - 5 x weekly - 3 sets - 30 hold - Seated Hamstring Stretch (Mirrored)  - 1 x daily - 5 x weekly - 3 sets - 30 hold  Access Code: [email or web address] URL: [email or web address] Date: 08/14/2023 Prepared by: Daved Bull  Exercises - Standing Hip Abduction Adduction at [facility]  - 1 x daily - 7 x weekly - 3 sets - 10 reps - Standing Hip Flexion Extension at [employer]  - 1 x daily - 7 x weekly - 3 sets - 10 reps - Squat  - 1 x daily - 7 x weekly - 3 sets - 10 reps - Forward Walking  - 1 x daily - 7 x weekly - 3 sets - 10 reps - Backward Walking  - 1 x daily - 7 x weekly - 3 sets - 10 reps - Side Stepping  - 1 x daily - 7 x weekly - 3 sets - 10 reps - Forward March  - 1 x daily - 7 x weekly - 3 sets - 10 reps - Bilateral Shoulder Abduction Adduction AROM at Pool Wall  - 1 x daily - 7 x weekly - 3 sets - 10 reps - Plank with Hip Extension at [employer]  - 1 x daily - 7 x weekly - 3 sets - 10 reps - Side to Side Hamstring Stretch with Noodle at [employer]  - 1 x daily - 7 x weekly - 3 sets - 10 reps  GOALS: Goals reviewed with patient? Yes  SHORT TERM GOALS: Target date: 07/21/2023   Pt will be independent with initial HEP in order to build upon functional gains made in therapy. Baseline: Goal status: MET  2.  mCTSIB to be assessed with LTG written.  Baseline: 120 sec (1/7) Goal status: MET    LONG TERM GOALS: Target date: 08/18/2023  Pt will be independent with initial HEP in order to build upon functional gains made in therapy. Baseline: independent with final land HEP  Goal status: MET  2.  mCTSIB to be assessed with LTG written.  Baseline: No goal needed - 120 sec  (1/7) Goal status: REVISED - D/C'd  3.  Pt will improve FGA to at least a 24/30 in order to demo decr fall risk.  Baseline: 21/30  22/30  Goal status: NOT MET   4.  Pt will be able to perform fall recovery with supervision in order to safely get on and off the floor.  Baseline: pt reports that she does not want to try it today due to L hip discomfort  Goal status: N/A   5.  Pt will improve gait speed with no AD to at least 3.2 ft/sec in order to demo improved community mobility.   Baseline: 11.1 seconds = 2.95 ft/sec  3.1 ft/sec  Goal status: NOT MET   LONG TERM GOALS: Target date: 09/16/2023 (for re-cert to cover last aquatic visit)   Pt will be independent with final aquatic HEP in order to build upon functional gains made in therapy. Baseline: independent with final land  HEP, will review and finalize aquatic HEP Goal status: NEW   ASSESSMENT:  CLINICAL IMPRESSION: Today's skilled session focused on assessing LTGs for anticipated D/C from land therapy. Pt has met HEP goal regarding final land HEP for mobility, strength, and balance. Pt did not meet LTGs #3 and #5. Pt improved FGA from a 21/30 > 22/30, but pt still with most difficulty with tandem gait and stairs due to hips. Pt improved gait speed from 2.95 ft/sec > 3.1 ft/sec, but not quite to goal level. Pt's hip discomfort and pain limit her ability to participate in land PT. Discussed will have today be her last land PT appt and for pt to have one more in the pool. Pt in agreement with this plan. Discussed following up with orthopedic doctor regarding hip discomfort, esp now on L side. Re-cert performed to cover 1 more aquatic visit.     OBJECTIVE IMPAIRMENTS: Abnormal gait, decreased activity tolerance, decreased balance, decreased endurance, decreased mobility, decreased ROM, decreased strength, impaired flexibility, impaired sensation, postural dysfunction, and pain.   ACTIVITY LIMITATIONS: bending and locomotion  level  PARTICIPATION LIMITATIONS: community activity  PERSONAL FACTORS: Age, Behavior pattern, Past/current experiences, Time since onset of injury/illness/exacerbation, and 3+ comorbidities: Chronic thrombus of L femoral vein, HLD, CAD, cardiomyopathy, hyperparathyroidism, osteoporosis, R hip replacement in 2008  are also affecting patient's functional outcome.   REHAB POTENTIAL: Good  CLINICAL DECISION MAKING: Stable/uncomplicated  EVALUATION COMPLEXITY: Low  PLAN:  PT FREQUENCY: 1x/week  PT DURATION: 4 weeks - due to delay in scheduling   PLANNED INTERVENTIONS: [id number]- PT Re-evaluation, 97110-Therapeutic exercises, 97530- Therapeutic activity, 97112- Neuromuscular re-education, 97535- Self Care, [id number]- Manual therapy, [phone]- Gait training, [phone]- Aquatic Therapy, Patient/Family education, Balance training, Stair training, Vestibular training, and DME instructions  PLAN FOR NEXT SESSION:  have one more visit of aquatic therapy, make sure pt feels good about final HEP and work on [employer], PT, DPT 08/19/2023, 2:04 PM

## 2023-09-04 ENCOUNTER — Ambulatory Visit: Payer: Medicare HMO | Admitting: Physical Therapy

## 2023-09-04 ENCOUNTER — Encounter: Payer: Self-pay | Admitting: Physical Therapy

## 2023-09-04 DIAGNOSIS — R2681 Unsteadiness on feet: Secondary | ICD-10-CM | POA: Diagnosis not present

## 2023-09-04 DIAGNOSIS — M6281 Muscle weakness (generalized): Secondary | ICD-10-CM

## 2023-09-04 NOTE — Therapy (Signed)
 OUTPATIENT PHYSICAL THERAPY NEURO TREATMENT/DISCHARGE SUMMARY   Patient Name: Gloria Lambert MRN: [id number] DOB:[birth year], 78 y.o., female Today's Date: 09/04/2023   PCP: Gwenlyn Found, MD  REFERRING PROVIDER: Talmage Coin, MD  PHYSICAL THERAPY DISCHARGE SUMMARY  Visits from Start of Care: 7  Current functional level related to goals / functional outcomes: See clinical impression statement.   Remaining deficits: None significant.   Education / Equipment: Discharge plan and aquatic HEP.   Patient agrees to discharge. Patient goals were met. Patient is being discharged due to meeting the stated rehab goals.   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) 11/06/2015   Coronary artery calcification seen on CT scan    Coronary CTA 10/19:  Ca score 12 (49th percentile - intermediate risk); mid LAD calcified plaque; no obstructive CAD; Small to mod hiatal hernia   Depression    Dilated cardiomyopathy (HCC) 05/19/2018   Echo 04/10/18 - Mod LVH, EF 45-50, ant-lat HK, Gr 1 DD, trivial MR   Diverticulosis    Edema    GERD (gastroesophageal reflux disease)    Heart murmur 03/2018   Hemorrhoids    Heterozygous factor V Leiden mutation (HCC) 11/06/2015   History of colon polyps    History of echocardiogram    Echo 9/19: mod LVH, EF 45-50, ant-lat HK, Gr 1 DD,  trivial MR   History of hiatal hernia 05/01/2018   Noted on CT   HLD (hyperlipidemia)    Hyperparathyroidism (HCC)    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 - surgery not recommended   OA (osteoarthritis)    Obese    Osteopenia    Palpitations    Cardiac monitor 9/21: NSR, average HR 66; no AFib/flutter; no high-grade heart block; rare PVCs and frequent supraventricular beats (isolated PACs, frequent short SVT runs); SVEs ~24%   Premature atrial contractions 10/03/2021   ZIO monitor 03/2020 NSR, average heart rate 66 No atrial fibrillation/flutter No high-grade heart block or pathologic pauses PVCs and frequent supraventricular beats, PACs, frequent short supraventricular runs   Sleep apnea    does not use CPAP because of fit   Stress incontinence    Past Surgical History:  Procedure Laterality Date   arthroscopic knee surgery Right    BILATERAL OOPHORECTOMY  1998   BLADDER SUSPENSION  1998   COLONOSCOPY     HIP ARTHROPLASTY Right    total hip replacement   PARATHYROIDECTOMY N/A 07/30/2018   Procedure: PARATHYROIDECTOMY WITH NECK EXPLORATION;  Surgeon: Darnell Level, MD;  Location: [facility];  Service: General;  Laterality: N/A;   TONSILLECTOMY     VAGINAL HYSTERECTOMY  1998   Patient Active Problem List   Diagnosis Date Noted  IDA (iron deficiency anemia) 04/15/2022   Premature atrial contractions 10/03/2021   Primary hyperparathyroidism (HCC) 07/30/2018   NICM (nonischemic cardiomyopathy) (HCC) 05/19/2018   Pure hypercholesterolemia 05/19/2018   Coronary artery calcification seen on CT scan    Hyperparathyroidism (HCC) 03/31/2018   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  She has no concerns about aquatic HEP so far.  Pt accompanied by: self  PERTINENT HISTORY: PMH: Chronic thrombus of L femoral vein, HLD, CAD, cardiomyopathy, hyperparathyroidism, osteoporosis, R hip replacement in 2008  PAIN:  Are you having pain? No  PRECAUTIONS: Fall; pt cannot tolerate laying supine due to hiatal hernia and severe kyphosis  WEIGHT BEARING RESTRICTIONS: No  FALLS: Has patient fallen in last 6 months? Yes. Number of falls 1, when she tripped  LIVING ENVIRONMENT: Lives with: lives alone Lives in: House/apartment Stairs: Yes: External: 3 steps; on right going up Has following equipment at home:  Has a cane and walker from her husband (passed away from lung cancer), but does not use them. Pt uses a hospital bed due to acid reflux  PLOF: Independent and Leisure: plays pool daily at [employer]   PATIENT GOALS: Work on some leg strength, wants to learn how to get on and off the floor   OBJECTIVE:  Note: Objective measures were completed at Evaluation unless otherwise noted.   COGNITION: Overall cognitive status: Within functional limits for tasks assessed   SENSATION: WFL  COORDINATION: Heel to shin: Limited in R hip due to tightness/pain   POSTURE: rounded shoulders, forward head, and increased thoracic kyphosis  LOWER EXTREMITY ROM:    Limited R hip AROM into internal rotation, pt reporting tightness and pain in R hip flexors   LOWER EXTREMITY MMT:    MMT Right Eval Left Eval  Hip flexion 3- 4+  Hip extension    Hip abduction 3- 3-  Hip adduction    Hip internal rotation    Hip external rotation    Knee flexion 5 5  Knee extension 4+ 5  Ankle dorsiflexion 5 5  Ankle plantarflexion    Ankle inversion    Ankle eversion    (Blank rows = not tested)  BED MOBILITY:  Takes incr time to lift RLE into bed due to weakness   TRANSFERS: Assistive device utilized: None  Sit to stand: Modified independence Stand to sit: Modified independence Able to perform with no UE support   STAIRS: Level of Assistance: SBA Stair Negotiation Technique: Step to Pattern Forwards with Single Rail on Right Number of Stairs: 4  Height of Stairs: 6"  Comments: Performs with step to pattern due to history of hip surgery   GAIT: Gait pattern: decreased stride length, decreased hip/knee flexion- Right, trunk flexed, and wide BOS Distance walked: Clinic distances  Assistive device utilized: None Level of assistance: Modified independence   FUNCTIONAL TESTS:  30 seconds chair stand test: 11 sit <> stands with no UE support, pt reporting pain in R knee (<10 less than average for age) 10 meter walk test: 11.1 seconds = 2.95 ft/sec       TODAY'S TREATMENT:                                                                                                                               Aquatic therapy at [facility] - pool temperature 92 degrees   Patient seen for aquatic therapy today.  Treatment took place in water 3.6-4.8 feet deep depending upon activity.  Patient entered and exited the pool via stairs using unilateral rail and reciprocal gait at mod I level.   Exercises: Water walking warmup:  4x18 ft forwards > retro-stepping > lateral stepping Reviewed HEP as follows: -3-way hip x10 each direction each LE -Plank kickbacks x20 -Squat w/ unilateral wall support x15 -Side-to-side hamstring stretch x2 minute each LE, pt able to place noodle without LOB using increased time -[facility] >yellow dumbbells for shoulder abduction/adduction x20 > MC DB for shoulder flexion/extension -Tandem walking w/ MC DB for counterbalance 2x18 ft -Walking march  4x18 ft -Lateral squat w/ shoulder abduction/adduction using yellow DB 4x18 ft -Seated flutter kicks x2 minutes -Seated reverse crunches x2 minutes using touch assist to bench as needed for seated balance -Hamstring curls using yellow noodle (PT assist for placement and to hold noodle in place during reps) -Standing board chest press in chest deep water x20 progressing away from wall support  Patient requires buoyancy of the water for support for reduced fall risk with gait training and balance exercises Mod I (single instance of MinA during hamstring curls). Exercises able to be performed safely in water without the risk of fall compared to those same exercises performed on land; viscosity of water needed for resistance for strengthening. Current of water provides perturbations for challenging static and dynamic balance.     PATIENT EDUCATION: Education details: Discharge plan and variations from aquatic HEP. Person educated: Patient Education method: Explanation, Demonstration, Verbal cues, and Handouts Education comprehension: verbalized understanding, returned demonstration, verbal cues  required, and needs further education  HOME EXERCISE PROGRAM: Access Code: [email or web address] URL: [email or web address] Date: 08/19/2023 Prepared by: Sherlie Ban  Exercises - Seated March  - 1 x daily - 4 x weekly - 2 sets - 20 reps - Standing with Feet Together on Foam Pad  - 1 x daily - 4 x weekly - 1 sets - 3 reps - 30 seconds hold - Romberg Stance Eyes Closed on Foam Pad  - 1 x daily - 4 x weekly - 1 sets - 3 reps - 30 seconds hold - Standing Hip Flexor Stretch  - 1 x daily - 5 x weekly - 3 sets - 30 hold - Standing Hip Abduction with Resistance at Ankles and Counter Support  - 1 x daily - 3-4 x weekly - 2 sets - 10 reps - Hip Extension with Resistance Loop  - 1 x daily - 3-4 x weekly - 2 sets - 10 reps - Seated Figure 4 Piriformis Stretch  - 1 x daily - 5 x weekly - 3 sets - 30 hold -  Seated Hamstring Stretch (Mirrored)  - 1 x daily - 5 x weekly - 3 sets - 30 hold  Access Code: [email or web address] URL: [email or web address] Date: 08/14/2023 Prepared by: Camille Bal  Exercises - Standing Hip Abduction Adduction at [facility]  - 1 x daily - 7 x weekly - 3 sets - 10 reps - Standing Hip Flexion Extension at [employer]  - 1 x daily - 7 x weekly - 3 sets - 10 reps - Squat  - 1 x daily - 7 x weekly - 3 sets - 10 reps - Forward Walking  - 1 x daily - 7 x weekly - 3 sets - 10 reps - Backward Walking  - 1 x daily - 7 x weekly - 3 sets - 10 reps - Side Stepping  - 1 x daily - 7 x weekly - 3 sets - 10 reps - Forward March  - 1 x daily - 7 x weekly - 3 sets - 10 reps - Bilateral Shoulder Abduction Adduction AROM at Pool Wall  - 1 x daily - 7 x weekly - 3 sets - 10 reps - Plank with Hip Extension at [employer]  - 1 x daily - 7 x weekly - 3 sets - 10 reps - Side to Side Hamstring Stretch with Noodle at [employer]  - 1 x daily - 7 x weekly - 3 sets - 10 reps  GOALS: Goals reviewed with patient? Yes  SHORT TERM GOALS: Target date: 07/21/2023   Pt will be independent with initial HEP in order to build upon functional gains made in therapy. Baseline: Goal status: MET  2.  mCTSIB to be assessed with LTG written.  Baseline: 120 sec (1/7) Goal status: MET    LONG TERM GOALS: Target date: 08/18/2023  Pt will be independent with initial HEP in order to build upon functional gains made in therapy. Baseline: independent with final land HEP  Goal status: MET  2.  mCTSIB to be assessed with LTG written.  Baseline: No goal needed - 120 sec (1/7) Goal status: REVISED - D/C'd  3.  Pt will improve FGA to at least a 24/30 in order to demo decr fall risk.  Baseline: 21/30  22/30  Goal status: NOT MET   4.  Pt will be able to perform fall recovery with supervision in order to safely get on and off the floor.  Baseline: pt reports that she  does not want to try it today due to L  hip discomfort  Goal status: N/A   5.  Pt will improve gait speed with no AD to at least 3.2 ft/sec in order to demo improved community mobility.   Baseline: 11.1 seconds = 2.95 ft/sec  3.1 ft/sec  Goal status: NOT MET   LONG TERM GOALS: Target date: 09/16/2023 (for re-cert to cover last aquatic visit)   Pt will be independent with final aquatic HEP in order to build upon functional gains made in therapy. Baseline: independent with final land HEP, will review and finalize aquatic HEP Goal status: MET   ASSESSMENT:  CLINICAL IMPRESSION: Patient completes last scheduled therapy appt in aquatic setting today.  She demonstrates great understanding and performance of HEP.  Time spent practicing other variable balance and strengthening exercises and modifications to HEP for pt use in community setting to progress her practice.  She is overall doing very well and has made moderate progress towards goals as assessed last session.  She is in agreement to discharge from OPPT at this time.  OBJECTIVE IMPAIRMENTS: Abnormal gait, decreased activity tolerance, decreased balance, decreased endurance, decreased mobility, decreased ROM, decreased strength, impaired flexibility, impaired sensation, postural dysfunction, and pain.   ACTIVITY LIMITATIONS: bending and locomotion level  PARTICIPATION LIMITATIONS: community activity  PERSONAL FACTORS: Age, Behavior pattern, Past/current experiences, Time since onset of injury/illness/exacerbation, and 3+ comorbidities: Chronic thrombus of L femoral vein, HLD, CAD, cardiomyopathy, hyperparathyroidism, osteoporosis, R hip replacement in 2008  are also affecting patient's functional outcome.   REHAB POTENTIAL: Good  CLINICAL DECISION MAKING: Stable/uncomplicated  EVALUATION COMPLEXITY: Low  PLAN:  PT FREQUENCY: 1x/week  PT DURATION: 4 weeks - due to delay in scheduling   PLANNED INTERVENTIONS: 97164- PT Re-evaluation, 97110-Therapeutic exercises,  97530- Therapeutic activity, [id number]- Neuromuscular re-education, 97535- Self Care, [id number]- Manual therapy, [phone]- Gait training, [phone]- Aquatic Therapy, Patient/Family education, Balance training, Stair training, Vestibular training, and DME instructions  PLAN FOR NEXT SESSION:  N/A   Sadie Haber, PT, DPT 09/04/2023, 2:00 PM

## 2023-10-14 ENCOUNTER — Other Ambulatory Visit: Payer: Self-pay | Admitting: Hematology & Oncology

## 2023-10-14 DIAGNOSIS — I82512 Chronic embolism and thrombosis of left femoral vein: Secondary | ICD-10-CM

## 2023-10-15 ENCOUNTER — Encounter: Payer: Self-pay | Admitting: Family

## 2023-12-01 ENCOUNTER — Encounter: Payer: Self-pay | Admitting: Family

## 2023-12-01 ENCOUNTER — Inpatient Hospital Stay: Payer: Medicare HMO | Admitting: Medical Oncology

## 2023-12-01 ENCOUNTER — Other Ambulatory Visit (HOSPITAL_BASED_OUTPATIENT_CLINIC_OR_DEPARTMENT_OTHER): Payer: Self-pay

## 2023-12-01 ENCOUNTER — Inpatient Hospital Stay: Payer: Medicare HMO | Attending: Hematology & Oncology

## 2023-12-01 ENCOUNTER — Encounter: Payer: Self-pay | Admitting: Medical Oncology

## 2023-12-01 VITALS — BP 105/49 | HR 55 | Temp 98.5°F | Resp 18 | Wt 199.4 lb

## 2023-12-01 DIAGNOSIS — I82412 Acute embolism and thrombosis of left femoral vein: Secondary | ICD-10-CM | POA: Diagnosis not present

## 2023-12-01 DIAGNOSIS — D6851 Activated protein C resistance: Secondary | ICD-10-CM | POA: Insufficient documentation

## 2023-12-01 DIAGNOSIS — Z86718 Personal history of other venous thrombosis and embolism: Secondary | ICD-10-CM

## 2023-12-01 DIAGNOSIS — D500 Iron deficiency anemia secondary to blood loss (chronic): Secondary | ICD-10-CM

## 2023-12-01 DIAGNOSIS — I82512 Chronic embolism and thrombosis of left femoral vein: Secondary | ICD-10-CM | POA: Insufficient documentation

## 2023-12-01 DIAGNOSIS — Z7901 Long term (current) use of anticoagulants: Secondary | ICD-10-CM | POA: Insufficient documentation

## 2023-12-01 LAB — CMP (CANCER CENTER ONLY)
ALT: 9 U/L (ref 0–44)
AST: 15 U/L (ref 15–41)
Albumin: 4.5 g/dL (ref 3.5–5.0)
Alkaline Phosphatase: 31 U/L — ABNORMAL LOW (ref 38–126)
Anion gap: 7 (ref 5–15)
BUN: 16 mg/dL (ref 8–23)
CO2: 27 mmol/L (ref 22–32)
Calcium: 9.8 mg/dL (ref 8.9–10.3)
Chloride: 107 mmol/L (ref 98–111)
Creatinine: 0.78 mg/dL (ref 0.44–1.00)
GFR, Estimated: >60 mL/min (ref >60)
Glucose, Bld: 103 mg/dL — ABNORMAL HIGH (ref 70–99)
Potassium: 4.9 mmol/L (ref 3.5–5.1)
Sodium: 141 mmol/L (ref 135–145)
Total Bilirubin: 1.6 mg/dL — ABNORMAL HIGH (ref 0.0–1.2)
Total Protein: 6.5 g/dL (ref 6.5–8.1)

## 2023-12-01 LAB — CBC WITH DIFFERENTIAL (CANCER CENTER ONLY)
Abs Immature Granulocytes: 0.01 10*3/uL (ref 0.00–0.07)
Basophils Absolute: 0 10*3/uL (ref 0.0–0.1)
Basophils Relative: 1 %
Eosinophils Absolute: 0.2 10*3/uL (ref 0.0–0.5)
Eosinophils Relative: 4 %
HCT: 37.9 % (ref 36.0–46.0)
Hemoglobin: 12.7 g/dL (ref 12.0–15.0)
Immature Granulocytes: 0 %
Lymphocytes Relative: 21 %
Lymphs Abs: 0.9 10*3/uL (ref 0.7–4.0)
MCH: 29.9 pg (ref 26.0–34.0)
MCHC: 33.5 g/dL (ref 30.0–36.0)
MCV: 89.2 fL (ref 80.0–100.0)
Monocytes Absolute: 0.3 10*3/uL (ref 0.1–1.0)
Monocytes Relative: 7 %
Neutro Abs: 3 10*3/uL (ref 1.7–7.7)
Neutrophils Relative %: 67 %
Platelet Count: 132 10*3/uL — ABNORMAL LOW (ref 150–400)
RBC: 4.25 MIL/uL (ref 3.87–5.11)
RDW: 14.4 % (ref 11.5–15.5)
WBC Count: 4.5 10*3/uL (ref 4.0–10.5)
nRBC: 0 % (ref 0.0–0.2)

## 2023-12-01 LAB — FERRITIN: Ferritin: 19 ng/mL (ref 11–307)

## 2023-12-01 NOTE — Progress Notes (Signed)
 Hematology and Oncology Follow Up Visit  Gloria Lambert [id number] Dec 09, 1945 78 y.o. 12/01/2023   Principle Diagnosis:  Thrombus of the left femoral vein- chronic Heterozygous for Factor V Leiden mutation   Past Therapy: Xarelto  20 mg by mouth daily - finish 6 months in August 2017   Current Therapy:        Maintenance Xarelto  10 mg by mouth daily   Interim History:  Gloria Lambert is here today for follow-up.   Today she states that she has been doing well. She has no concerns No other bleeding episodes  No clotting events since her original clot.  No other falls, no syncope.  No fever, chills, n/v, cough, rash, dizziness, SOB, chest pain, palpitations, abdominal pain or changes in bowel or bladder habits.  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  No swelling or tenderness in her extremities other than her chronic left leg swelling if she does not wear her compression stocking  Appetite and hydration are good.  Wt Readings from Last 3 Encounters:  12/01/23 199 lb 6.4 oz (90.4 kg)  06/24/23 191 lb 9.6 oz (86.9 kg)  06/02/23 187 lb 6.4 oz (85 kg)   ECOG Performance Status: 0 - Asymptomatic  Medications:  Allergies as of 12/01/2023       Reactions   Latex Rash   Penicillin G Rash, Other (See Comments)   DID THE REACTION INVOLVE: Swelling of the face/tongue/throat, SOB, or low BP? Unknown Sudden or severe rash/hives, skin peeling, or the inside of the mouth or nose? Unknown Did it require medical treatment? Unknown When did it last happen? Years ago If all above answers are "NO", may proceed with cephalosporin use.        Medication List        Accurate as of Dec 01, 2023  3:12 PM. If you have any questions, ask your nurse or doctor.          ALPRAZolam  0.25 MG tablet Commonly known as: XANAX  Take 0.25 mg by mouth 2 (two) times daily as needed for anxiety or sleep.   chlorhexidine  0.12 % solution Commonly known as: PERIDEX  Use as directed 15 mLs  in the mouth or throat 2 (two) times daily.   citalopram  10 MG tablet Commonly known as: CELEXA  Take 10 mg by mouth daily.   famotidine 40 MG tablet Commonly known as: PEPCID Take 40 mg by mouth daily.   FISH OIL PO Take 1 capsule by mouth daily.   lisinopril  5 MG tablet Commonly known as: ZESTRIL  TAKE 1/2 TABLET BY MOUTH DAILY.   metoprolol  succinate 25 MG 24 hr tablet Commonly known as: TOPROL -XL Take 1 tablet (25 mg total) by mouth daily.   polyethylene glycol powder 17 GM/SCOOP powder Commonly known as: GLYCOLAX/MIRALAX Take 1 Container by mouth as needed for mild constipation or moderate constipation.   PROBIOTIC-10 PO Take 1 capsule by mouth daily.   Reclast  5 MG/100ML Soln injection Generic drug: zoledronic  acid Inject 5 mg into the vein once. yearly   rosuvastatin  10 MG tablet Commonly known as: CRESTOR  TAKE 1 TABLET BY MOUTH EVERY DAY.   tacrolimus 0.1 % ointment Commonly known as: PROTOPIC Apply 1 Application topically 2 (two) times daily. Under breast   VITAMIN D PO Take 1 tablet by mouth daily.   Vitamin D3 1000 units Caps 1 capsule Orally Once a day   Xarelto  10 MG Tabs tablet Generic drug: rivaroxaban  TAKE 1 TABLET BY MOUTH DAILY WITH SUPPER  Allergies:  Allergies  Allergen Reactions   Latex Rash   Penicillin G Rash and Other (See Comments)    DID THE REACTION INVOLVE: Swelling of the face/tongue/throat, SOB, or low BP? Unknown Sudden or severe rash/hives, skin peeling, or the inside of the mouth or nose? Unknown Did it require medical treatment? Unknown When did it last happen? Years ago If all above answers are "NO", may proceed with cephalosporin use.     Past Medical History, Surgical history, Social history, and Family History were reviewed and updated.  Review of Systems: All other 10 point review of systems is negative.   Physical Exam:  weight is 199 lb 6.4 oz (90.4 kg). Her oral temperature is 98.5 F (36.9 C). Her  blood pressure is 105/49 (abnormal) and her pulse is 55 (abnormal). Her respiration is 18 and oxygen saturation is 97%.   Wt Readings from Last 3 Encounters:  12/01/23 199 lb 6.4 oz (90.4 kg)  06/24/23 191 lb 9.6 oz (86.9 kg)  06/02/23 187 lb 6.4 oz (85 kg)    Ocular: Sclerae unicteric, pupils equal, round and reactive to light Ear-nose-throat: Oropharynx clear, dentition fair Lymphatic: No cervical or supraclavicular adenopathy Lungs no rales or rhonchi, good excursion bilaterally Heart regular rate and rhythm, no murmur appreciated Abd soft, nontender, positive bowel sounds MSK no focal spinal tenderness, no joint edema Neuro: non-focal, well-oriented, appropriate affect  Lab Results  Component Value Date   WBC 4.5 12/01/2023   HGB 12.7 12/01/2023   HCT 37.9 12/01/2023   MCV 89.2 12/01/2023   PLT 132 (L) 12/01/2023   Lab Results  Component Value Date   FERRITIN 14 06/02/2023   IRON 53 06/02/2023   TIBC 437 06/02/2023   UIBC 384 06/02/2023   IRONPCTSAT 12 06/02/2023   Lab Results  Component Value Date   RETICCTPCT 1.8 06/02/2023   RBC 4.25 12/01/2023   No results found for: "KPAFRELGTCHN", "LAMBDASER", "KAPLAMBRATIO" No results found for: "IGGSERUM", "IGA", "IGMSERUM" No results found for: "TOTALPROTELP", "ALBUMINELP", "A1GS", "A2GS", "BETS", "BETA2SER", "GAMS", "MSPIKE", "SPEI"   Chemistry      Component Value Date/Time   NA 141 12/01/2023 1349   NA 143 03/10/2020 0903   NA 148 (H) 05/15/2017 1452   NA 143 07/10/2016 1119   K 4.9 12/01/2023 1349   K 4.4 05/15/2017 1452   K 4.4 07/10/2016 1119   CL 107 12/01/2023 1349   CL 106 05/15/2017 1452   CO2 27 12/01/2023 1349   CO2 26 05/15/2017 1452   CO2 25 07/10/2016 1119   BUN 16 12/01/2023 1349   BUN 18 03/10/2020 0903   BUN 16 05/15/2017 1452   BUN 14.7 07/10/2016 1119   CREATININE 0.78 12/01/2023 1349   CREATININE 0.7 05/15/2017 1452   CREATININE 0.8 07/10/2016 1119      Component Value Date/Time    CALCIUM  9.8 12/01/2023 1349   CALCIUM  10.3 05/15/2017 1452   CALCIUM  10.3 07/10/2016 1119   ALKPHOS 31 (L) 12/01/2023 1349   ALKPHOS 51 05/15/2017 1452   ALKPHOS 52 07/10/2016 1119   AST 15 12/01/2023 1349   AST 19 07/10/2016 1119   ALT 9 12/01/2023 1349   ALT 28 05/15/2017 1452   ALT 14 07/10/2016 1119   BILITOT 1.6 (H) 12/01/2023 1349   BILITOT 1.03 07/10/2016 1119     Encounter Diagnoses  Name Primary?   Iron deficiency anemia due to chronic blood loss Yes   Heterozygous factor V Leiden mutation (HCC)    History of  DVT (deep vein thrombosis)    Chronic anticoagulation     Impression and Plan: Ms. Dalia is a very pleasant 78 yo Caucasian female with history of DVT in the left leg and she is heterozygous for Factor V Leiden.   Continue Xarelto  at current dose CBC and CMP reviewed - bilirubin up slightly- will monitor.  Iron studies pending  RTC 6 months APP, labs (CBC, CMP, retic, iron, ferritin).   Sharla Davis, PA-C 5/19/20253:12 PM

## 2023-12-02 ENCOUNTER — Ambulatory Visit: Payer: Self-pay | Admitting: Medical Oncology

## 2023-12-02 ENCOUNTER — Other Ambulatory Visit: Payer: Self-pay | Admitting: Medical Oncology

## 2023-12-02 LAB — IRON AND IRON BINDING CAPACITY (CC-WL,HP ONLY)
Iron: 78 ug/dL (ref 28–170)
Saturation Ratios: 19 % (ref 10.4–31.8)
TIBC: 416 ug/dL (ref 250–450)
UIBC: 338 ug/dL (ref 148–442)

## 2023-12-09 ENCOUNTER — Encounter: Payer: Self-pay | Admitting: Family

## 2023-12-09 NOTE — Telephone Encounter (Addendum)
 Called to schedule IV Iron per inbasket. LVM to return call for scheduling.   ----- Message from Sharla Davis sent at 12/02/2023  2:03 PM EDT ----- Iron studies are on the lower side. I would suggest IV iron Scheduling please schedule for IV Feraheme once weekly x 2

## 2023-12-16 ENCOUNTER — Telehealth: Payer: Self-pay | Admitting: Medical Oncology

## 2023-12-16 NOTE — Telephone Encounter (Signed)
 Called pt to schedule IV Iron per inbasket. Pt noted that she was on vacation and wanted to call back at a later date. Deferring inbasket to next week.

## 2023-12-23 ENCOUNTER — Encounter: Payer: Self-pay | Admitting: *Deleted

## 2024-01-06 ENCOUNTER — Telehealth: Payer: Self-pay | Admitting: Medical Oncology

## 2024-01-06 NOTE — Telephone Encounter (Signed)
 Called to schedule IV Iron per inbasket. LVM to return call for scheduling.

## 2024-01-20 ENCOUNTER — Telehealth: Payer: Self-pay | Admitting: Medical Oncology

## 2024-01-20 NOTE — Telephone Encounter (Signed)
 Called to schedule IV Iron per inbasket. Unable to LVM to return call for scheduling. Closing inbasket as >3 attempts have been made to schedule.

## 2024-03-18 ENCOUNTER — Other Ambulatory Visit: Payer: Self-pay | Admitting: Physician Assistant

## 2024-03-18 DIAGNOSIS — E7800 Pure hypercholesterolemia, unspecified: Secondary | ICD-10-CM

## 2024-03-18 DIAGNOSIS — I428 Other cardiomyopathies: Secondary | ICD-10-CM

## 2024-03-18 MED ORDER — ROSUVASTATIN CALCIUM 10 MG PO TABS: ORAL_TABLET | ORAL | 0 refills | Status: DC

## 2024-03-18 MED ORDER — METOPROLOL SUCCINATE ER 25 MG PO TB24: 25.0000 mg | ORAL_TABLET | Freq: Every day | ORAL | 0 refills | Status: DC

## 2024-03-18 NOTE — Addendum Note (Signed)
 Addended by: DARIO IZETTA CROME on: 03/18/2024 09:58 AM   Modules accepted: Orders

## 2024-03-19 ENCOUNTER — Telehealth: Payer: Self-pay | Admitting: Physician Assistant

## 2024-03-19 DIAGNOSIS — I428 Other cardiomyopathies: Secondary | ICD-10-CM

## 2024-03-19 MED ORDER — LISINOPRIL 5 MG PO TABS: ORAL_TABLET | ORAL | 0 refills | Status: DC

## 2024-03-19 NOTE — Telephone Encounter (Signed)
*  STAT* If patient is at the pharmacy, call can be transferred to refill team.   1. Which medications need to be refilled? (please list name of each medication and dose if known) lisinopril  (ZESTRIL ) 5 MG tablet    2. Would you like to learn more about the convenience, safety, & potential cost savings by using the [facility]? No   3. Are you open to using the [facility] (Type [facility].) No   4. Which pharmacy/location (including street and city if local pharmacy) is medication to be sent to?  [id number] - [place], [place] - [address]   5. Do they need a 30 day or 90 day supply? 90 day  Pt is out of medication

## 2024-03-19 NOTE — Telephone Encounter (Signed)
 Pt's medication was sent to pt's pharmacy as requested. Confirmation received.

## 2024-03-22 MED ORDER — LISINOPRIL 5 MG PO TABS: ORAL_TABLET | ORAL | 0 refills | Status: AC

## 2024-03-22 MED ORDER — LISINOPRIL 5 MG PO TABS: ORAL_TABLET | ORAL | 0 refills | Status: DC

## 2024-03-22 NOTE — Telephone Encounter (Signed)
 Pt's medication was resent to pt's pharmacy as requested. Confirmation received.

## 2024-03-22 NOTE — Telephone Encounter (Signed)
 Pt's medication was resent to pt's pharmacy for a 30 day supply. I called pt's pharmacy and pharmacist stated, pt's insurance was stating pt was requesting medication to soon and pt was out of town and left medication at home, but pharmacy ran it through and pt might have to pay out of pocket. Confirmation received.

## 2024-03-22 NOTE — Addendum Note (Signed)
 Addended by: BLUFORD RAMP D on: 03/22/2024 10:18 AM   Modules accepted: Orders

## 2024-03-22 NOTE — Addendum Note (Signed)
 Addended by: BLUFORD RAMP D on: 03/22/2024 10:28 AM   Modules accepted: Orders

## 2024-04-13 ENCOUNTER — Other Ambulatory Visit: Payer: Self-pay | Admitting: Hematology & Oncology

## 2024-04-13 DIAGNOSIS — I82512 Chronic embolism and thrombosis of left femoral vein: Secondary | ICD-10-CM

## 2024-06-02 ENCOUNTER — Inpatient Hospital Stay

## 2024-06-02 ENCOUNTER — Ambulatory Visit: Admitting: Medical Oncology

## 2024-06-16 ENCOUNTER — Other Ambulatory Visit: Payer: Self-pay | Admitting: Physician Assistant

## 2024-06-16 DIAGNOSIS — I428 Other cardiomyopathies: Secondary | ICD-10-CM

## 2024-06-16 DIAGNOSIS — E7800 Pure hypercholesterolemia, unspecified: Secondary | ICD-10-CM

## 2024-06-22 MED ORDER — METOPROLOL SUCCINATE ER 25 MG PO TB24: 25.0000 mg | ORAL_TABLET | Freq: Every day | ORAL | 0 refills | Status: AC

## 2024-06-22 MED ORDER — ROSUVASTATIN CALCIUM 10 MG PO TABS: ORAL_TABLET | ORAL | 0 refills | Status: AC
